# Patient Record
Sex: Female | Born: 1990 | Race: Black or African American | Hispanic: No | Marital: Single | Smoking: Former smoker
Health system: Southern US, Community
[De-identification: ages and names within clinical notes are randomized; demographics above are authoritative.]

## PROBLEM LIST (undated history)

## (undated) DIAGNOSIS — J45909 Unspecified asthma, uncomplicated: Secondary | ICD-10-CM

## (undated) DIAGNOSIS — I10 Essential (primary) hypertension: Secondary | ICD-10-CM

## (undated) DIAGNOSIS — E0590 Thyrotoxicosis, unspecified without thyrotoxic crisis or storm: Secondary | ICD-10-CM

## (undated) HISTORY — DX: Thyrotoxicosis, unspecified without thyrotoxic crisis or storm: E05.90

## (undated) HISTORY — PX: NO PAST SURGERIES: SHX2092

---
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
## 2010-01-29 ENCOUNTER — Emergency Department (HOSPITAL_COMMUNITY): Admission: EM | Admit: 2010-01-29 | Discharge: 2010-01-29 | Payer: Self-pay | Admitting: Family Medicine

## 2012-05-15 ENCOUNTER — Emergency Department (HOSPITAL_COMMUNITY): Payer: BC Managed Care – PPO

## 2012-05-15 ENCOUNTER — Emergency Department (HOSPITAL_COMMUNITY)
Admission: EM | Admit: 2012-05-15 | Discharge: 2012-05-15 | Disposition: A | Discharge status: Home / Self Care | Payer: BC Managed Care – PPO | Attending: Emergency Medicine | Admitting: Emergency Medicine

## 2012-05-15 ENCOUNTER — Encounter (HOSPITAL_COMMUNITY): Payer: Self-pay | Admitting: Emergency Medicine

## 2012-05-15 DIAGNOSIS — Z87891 Personal history of nicotine dependence: Secondary | ICD-10-CM | POA: Insufficient documentation

## 2012-05-15 DIAGNOSIS — Y929 Unspecified place or not applicable: Secondary | ICD-10-CM | POA: Insufficient documentation

## 2012-05-15 DIAGNOSIS — S93609A Unspecified sprain of unspecified foot, initial encounter: Secondary | ICD-10-CM | POA: Insufficient documentation

## 2012-05-15 DIAGNOSIS — Z79899 Other long term (current) drug therapy: Secondary | ICD-10-CM | POA: Insufficient documentation

## 2012-05-15 DIAGNOSIS — X500XXA Overexertion from strenuous movement or load, initial encounter: Secondary | ICD-10-CM | POA: Insufficient documentation

## 2012-05-15 DIAGNOSIS — J45909 Unspecified asthma, uncomplicated: Secondary | ICD-10-CM | POA: Insufficient documentation

## 2012-05-15 DIAGNOSIS — Y9301 Activity, walking, marching and hiking: Secondary | ICD-10-CM | POA: Insufficient documentation

## 2012-05-15 HISTORY — DX: Unspecified asthma, uncomplicated: J45.909

## 2012-05-15 NOTE — ED Provider Notes (Signed)
History   This chart was scribed for Kathleen Lyons, MD, by Frederik Pear, ER scribe. The patient was seen in room TR05C/TR05C and the patient's care was started at 1334.    CSN: [id number]  Arrival date & time 05/15/12  1317   First MD Initiated Contact with Patient 05/15/12 1334      Chief Complaint  Patient presents with  . Foot Injury    (Consider location/radiation/quality/duration/timing/severity/associated sxs/prior treatment) HPI Comments: [name] is a 21 y.o. female who presents to the Emergency Department complaining of right foot pain that began after she twisted her ankle while walking twice today and once last night. She reports that she previously broke her ankle in high school. She denies any associated pain or symptoms. She denies trying any treatments prior to arrival in ED.     Past Medical History  Diagnosis Date  . Asthma     History reviewed. No pertinent past surgical history.  No family history on file.  History  Substance Use Topics  . Smoking status: Former Games developer  . Smokeless tobacco: Not on file  . Alcohol Use: Yes    OB History    Grav Para Term Preterm Abortions TAB SAB Ect Mult Living                  Review of Systems  Allergies  Review of patient's allergies indicates no known allergies.  Home Medications   Current Outpatient Rx  Name  Route  Sig  Dispense  Refill  . ALBUTEROL SULFATE HFA 108 (90 BASE) MCG/ACT IN AERS   Inhalation   Inhale 2 puffs into the lungs every 6 (six) hours as needed. For wheezing and shortness of breath.         Marland Kitchen DIPHENHYDRAMINE HCL 25 MG PO TABS   Oral   Take 25 mg by mouth every 6 (six) hours as needed. For swelling.           BP 131/76  Pulse 120  Temp 98.1 F (36.7 C) (Oral)  Resp 16  SpO2 99%  LMP 04/26/2012  Physical Exam  Nursing note and vitals reviewed. Constitutional: She is oriented to person, place, and time. She appears well-developed and well-nourished. No  distress.  HENT:  Head: Normocephalic and atraumatic.  Eyes: EOM are normal. Pupils are equal, round, and reactive to light.  Neck: Normal range of motion. Neck supple. No tracheal deviation present.  Cardiovascular: Normal rate.   Pulmonary/Chest: Effort normal. No respiratory distress.  Abdominal: Soft. She exhibits no distension.  Musculoskeletal: Normal range of motion. She exhibits no edema.       There is some mild swelling and tenderness to palpation over the dorsal proximal right foot. There is no medial or malleolar tenderness.  Neurological: She is alert and oriented to person, place, and time.  Skin: Skin is warm and dry.  Psychiatric: She has a normal mood and affect. Her behavior is normal.    ED Course  Procedures (including critical care time)  DIAGNOSTIC STUDIES: Oxygen Saturation is 99% on room air, normal by my interpretation.    COORDINATION OF CARE:  13:44- Discussed planned course of treatment with the patient, including a right foot X-ray, who is agreeable at this time.   Labs Reviewed - No data to display Dg Foot Complete Right  05/15/2012  *RADIOLOGY REPORT*  Clinical Data: Twisting injury with pain and swelling of the right foot.  RIGHT FOOT COMPLETE - 3+ VIEW  Comparison:  None.  Findings:  There is no evidence of fracture or dislocation.  There is no evidence of arthropathy or other focal bone abnormality. Soft tissues are unremarkable.  IMPRESSION: Negative.   Original Report Authenticated By: Irish Lack, M.D.      No diagnosis found.    MDM  The xrays are negative, will treat as a sprain.    I personally performed the services described in this documentation, which was scribed in my presence. The recorded information has been reviewed and is accurate.          Kathleen Lyons, MD 05/15/12 1425

## 2012-05-15 NOTE — ED Notes (Signed)
Pt. Twisted her rt. Foot x 2 once today and last night

## 2012-12-13 ENCOUNTER — Other Ambulatory Visit: Payer: Self-pay | Admitting: Obstetrics and Gynecology

## 2012-12-14 ENCOUNTER — Encounter (HOSPITAL_COMMUNITY): Payer: Self-pay | Admitting: *Deleted

## 2012-12-15 MED ORDER — DOXYCYCLINE HYCLATE 100 MG IV SOLR
100.0000 mg | INTRAVENOUS | Status: AC
  Administered 2012-12-16: 100 mg via INTRAVENOUS
  Filled 2012-12-15: qty 100

## 2012-12-16 ENCOUNTER — Encounter (HOSPITAL_COMMUNITY): Payer: Self-pay | Admitting: Anesthesiology

## 2012-12-16 ENCOUNTER — Encounter (HOSPITAL_COMMUNITY)
Admission: RE | Disposition: A | Discharge status: Home / Self Care | Payer: Self-pay | Source: Ambulatory Visit | Attending: Obstetrics and Gynecology

## 2012-12-16 ENCOUNTER — Ambulatory Visit (HOSPITAL_COMMUNITY)
Admission: RE | Admit: 2012-12-16 | Discharge: 2012-12-16 | Disposition: A | Discharge status: Home / Self Care | Payer: BC Managed Care – PPO | Source: Ambulatory Visit | Attending: Obstetrics and Gynecology | Admitting: Obstetrics and Gynecology

## 2012-12-16 ENCOUNTER — Ambulatory Visit (HOSPITAL_COMMUNITY): Payer: BC Managed Care – PPO | Admitting: Anesthesiology

## 2012-12-16 DIAGNOSIS — Z640 Problems related to unwanted pregnancy: Secondary | ICD-10-CM | POA: Insufficient documentation

## 2012-12-16 DIAGNOSIS — Z79899 Other long term (current) drug therapy: Secondary | ICD-10-CM | POA: Insufficient documentation

## 2012-12-16 DIAGNOSIS — J45909 Unspecified asthma, uncomplicated: Secondary | ICD-10-CM | POA: Insufficient documentation

## 2012-12-16 DIAGNOSIS — Z332 Encounter for elective termination of pregnancy: Secondary | ICD-10-CM

## 2012-12-16 HISTORY — PX: DILATION AND EVACUATION: SHX1459

## 2012-12-16 LAB — CBC
HCT: 40.8 % (ref 36.0–46.0)
Hemoglobin: 14.7 g/dL (ref 12.0–15.0)
MCH: 29.8 pg (ref 26.0–34.0)
MCHC: 36 g/dL (ref 30.0–36.0)

## 2012-12-16 LAB — ABO/RH: ABO/RH(D): O POS

## 2012-12-16 SURGERY — DILATION AND EVACUATION, UTERUS
Anesthesia: Monitor Anesthesia Care | Site: Vagina | Wound class: Clean Contaminated

## 2012-12-16 MED ORDER — PROPOFOL 10 MG/ML IV BOLUS
INTRAVENOUS | Status: DC | PRN
  Administered 2012-12-16: 200 mg via INTRAVENOUS

## 2012-12-16 MED ORDER — IBUPROFEN 800 MG PO TABS: 800.0000 mg | ORAL_TABLET | Freq: Three times a day (TID) | ORAL | Status: DC | PRN

## 2012-12-16 MED ORDER — MIDAZOLAM HCL 5 MG/5ML IJ SOLN
INTRAMUSCULAR | Status: DC | PRN
  Administered 2012-12-16: 2 mg via INTRAVENOUS

## 2012-12-16 MED ORDER — LIDOCAINE HCL (CARDIAC) 20 MG/ML IV SOLN
INTRAVENOUS | Status: DC | PRN
  Administered 2012-12-16: 100 mg via INTRAVENOUS

## 2012-12-16 MED ORDER — PROPOFOL 10 MG/ML IV EMUL
INTRAVENOUS | Status: AC
  Filled 2012-12-16: qty 20

## 2012-12-16 MED ORDER — MEPERIDINE HCL 25 MG/ML IJ SOLN: 6.2500 mg | INTRAMUSCULAR | Status: DC | PRN

## 2012-12-16 MED ORDER — FENTANYL CITRATE 0.05 MG/ML IJ SOLN
INTRAMUSCULAR | Status: AC
  Filled 2012-12-16: qty 2

## 2012-12-16 MED ORDER — ONDANSETRON HCL 4 MG/2ML IJ SOLN
INTRAMUSCULAR | Status: AC
  Filled 2012-12-16: qty 2

## 2012-12-16 MED ORDER — ONDANSETRON HCL 4 MG/2ML IJ SOLN
INTRAMUSCULAR | Status: DC | PRN
  Administered 2012-12-16: 4 mg via INTRAVENOUS

## 2012-12-16 MED ORDER — CLINDAMYCIN HCL 300 MG PO CAPS: 300.0000 mg | ORAL_CAPSULE | Freq: Two times a day (BID) | ORAL | Status: DC

## 2012-12-16 MED ORDER — CHLOROPROCAINE HCL 1 % IJ SOLN
INTRAMUSCULAR | Status: DC | PRN
  Administered 2012-12-16: 20 mL

## 2012-12-16 MED ORDER — LIDOCAINE HCL (CARDIAC) 20 MG/ML IV SOLN
INTRAVENOUS | Status: AC
  Filled 2012-12-16: qty 5

## 2012-12-16 MED ORDER — CHLOROPROCAINE HCL 1 % IJ SOLN
INTRAMUSCULAR | Status: AC
  Filled 2012-12-16: qty 30

## 2012-12-16 MED ORDER — MIDAZOLAM HCL 2 MG/2ML IJ SOLN
INTRAMUSCULAR | Status: AC
  Filled 2012-12-16: qty 2

## 2012-12-16 MED ORDER — FENTANYL CITRATE 0.05 MG/ML IJ SOLN
INTRAMUSCULAR | Status: DC | PRN
  Administered 2012-12-16: 100 ug via INTRAVENOUS

## 2012-12-16 MED ORDER — METOCLOPRAMIDE HCL 5 MG/ML IJ SOLN: 10.0000 mg | Freq: Once | INTRAMUSCULAR | Status: AC | PRN

## 2012-12-16 MED ORDER — FENTANYL CITRATE 0.05 MG/ML IJ SOLN: 25.0000 ug | INTRAMUSCULAR | Status: DC | PRN

## 2012-12-16 MED ORDER — LACTATED RINGERS IV SOLN
INTRAVENOUS | Status: DC
  Administered 2012-12-16 (×2): via INTRAVENOUS

## 2012-12-16 SURGICAL SUPPLY — 20 items
CATH ROBINSON RED A/P 16FR (CATHETERS) ×2 IMPLANT
CLOTH BEACON ORANGE TIMEOUT ST (SAFETY) ×2 IMPLANT
DECANTER SPIKE VIAL GLASS SM (MISCELLANEOUS) ×2 IMPLANT
GLOVE BIO SURGEON STRL SZ 6.5 (GLOVE) ×2 IMPLANT
GLOVE BIOGEL PI IND STRL 7.0 (GLOVE) ×1 IMPLANT
GLOVE BIOGEL PI INDICATOR 7.0 (GLOVE) ×1
GOWN STRL REIN XL XLG (GOWN DISPOSABLE) ×4 IMPLANT
KIT BERKELEY 1ST TRIMESTER 3/8 (MISCELLANEOUS) ×2 IMPLANT
NEEDLE SPNL 22GX3.5 QUINCKE BK (NEEDLE) ×2 IMPLANT
NS IRRIG 1000ML POUR BTL (IV SOLUTION) ×2 IMPLANT
PACK VAGINAL MINOR WOMEN LF (CUSTOM PROCEDURE TRAY) ×2 IMPLANT
PAD OB MATERNITY 4.3X12.25 (PERSONAL CARE ITEMS) ×2 IMPLANT
PAD PREP 24X48 CUFFED NSTRL (MISCELLANEOUS) ×2 IMPLANT
SET BERKELEY SUCTION TUBING (SUCTIONS) ×2 IMPLANT
SYR CONTROL 10ML LL (SYRINGE) ×2 IMPLANT
TOWEL OR 17X24 6PK STRL BLUE (TOWEL DISPOSABLE) ×4 IMPLANT
VACURETTE 10 RIGID CVD (CANNULA) IMPLANT
VACURETTE 7MM CVD STRL WRAP (CANNULA) IMPLANT
VACURETTE 8 RIGID CVD (CANNULA) IMPLANT
VACURETTE 9 RIGID CVD (CANNULA) IMPLANT

## 2012-12-16 NOTE — Transfer of Care (Signed)
Immediate Anesthesia Transfer of Care Note  Patient: Kathleen [name]  Procedure(s) Performed: Procedure(s): DILATATION AND EVACUATION (N/A)  Patient Location: PACU  Anesthesia Type:General  Level of Consciousness: awake, alert  and oriented  Airway & Oxygen Therapy: Patient Spontanous Breathing  Post-op Assessment: Report given to PACU RN and Post -op Vital signs reviewed and stable  Post vital signs: Reviewed and stable  Complications: No apparent anesthesia complications

## 2012-12-16 NOTE — Brief Op Note (Signed)
12/16/2012  5:58 PM  PATIENT:  Kathleen [name]  22 y.o. female  PRE-OPERATIVE DIAGNOSIS:  Undesired Pregnancy , IUP @ 9 weeks  POST-OPERATIVE DIAGNOSIS:  Undesired Pregnancy ,IUP @ 9 weeks  PROCEDURE:  Suction dilation and evacuation  SURGEON:  Surgeon(s) and Role:    * Melba Araki Cathie Beams, MD - Primary  PHYSICIAN ASSISTANT:   ASSISTANTS: none   ANESTHESIA:   general and paracervical block  EBL:  Total I/O In: 800 [I.V.:800] Out: 69 [Urine:39; Blood:30]  BLOOD ADMINISTERED:none  DRAINS: none   LOCAL MEDICATIONS USED:  OTHER nesicaine  SPECIMEN:  Source of Specimen:  POC  DISPOSITION OF SPECIMEN:  PATHOLOGY  COUNTS:  YES  TOURNIQUET:  * No tourniquets in log *  DICTATION: .Other Dictation: Dictation Number [id number]  PLAN OF CARE: Discharge to home after PACU  PATIENT DISPOSITION:  PACU - hemodynamically stable.   Delay start of Pharmacological VTE agent (>24hrs) due to surgical blood loss or risk of bleeding: no

## 2012-12-16 NOTE — Anesthesia Preprocedure Evaluation (Signed)
Anesthesia Evaluation  Patient identified by MRN, date of birth, ID band Patient awake    Reviewed: Allergy & Precautions, H&P , NPO status , Patient's Chart, lab work & pertinent test results  Airway Mallampati: II TM Distance: >3 FB Neck ROM: Full    Dental  (+) Teeth Intact   Pulmonary neg pulmonary ROS, asthma ,  breath sounds clear to auscultation  Pulmonary exam normal       Cardiovascular negative cardio ROS  Rhythm:Regular Rate:Normal     Neuro/Psych negative neurological ROS  negative psych ROS   GI/Hepatic negative GI ROS, Neg liver ROS,   Endo/Other  negative endocrine ROS  Renal/GU negative Renal ROS     Musculoskeletal negative musculoskeletal ROS (+)   Abdominal   Peds  Hematology negative hematology ROS (+)   Anesthesia Other Findings   Reproductive/Obstetrics Undesired pregnancy 8 Weeks                           Anesthesia Physical Anesthesia Plan  ASA: II  Anesthesia Plan: General and MAC   Post-op Pain Management:    Induction: Intravenous  Airway Management Planned: LMA and Mask  Additional Equipment:   Intra-op Plan:   Post-operative Plan: Extubation in OR  Informed Consent: I have reviewed the patients History and Physical, chart, labs and discussed the procedure including the risks, benefits and alternatives for the proposed anesthesia with the patient or authorized representative who has indicated his/her understanding and acceptance.   Dental advisory given  Plan Discussed with: Anesthesiologist, CRNA and Surgeon  Anesthesia Plan Comments:         Anesthesia Quick Evaluation

## 2012-12-16 NOTE — Anesthesia Postprocedure Evaluation (Signed)
  Anesthesia Post-op Note  Patient: Kathleen [name]  Procedure(s) Performed: Procedure(s): DILATATION AND EVACUATION (N/A)  Patient Location: PACU  Anesthesia Type:General  Level of Consciousness: awake, alert  and oriented  Airway and Oxygen Therapy: Patient Spontanous Breathing  Post-op Pain: none  Post-op Assessment: Post-op Vital signs reviewed, Patient's Cardiovascular Status Stable, Respiratory Function Stable, Patent Airway, No signs of Nausea or vomiting and Pain level controlled  Post-op Vital Signs: Reviewed and stable  Complications: No apparent anesthesia complications

## 2012-12-17 ENCOUNTER — Encounter (HOSPITAL_COMMUNITY): Payer: Self-pay | Admitting: Obstetrics and Gynecology

## 2012-12-17 NOTE — Op Note (Signed)
[name], [name] NO.:  [id number]  MEDICAL RECORD NO.:  [id number]  LOCATION:  [facility]                          FACILITY:  [facility]  PHYSICIAN:  Maxie Better, M.D.DATE OF BIRTH:  [birth year]  DATE OF PROCEDURE:  12/16/2012 DATE OF DISCHARGE:                              OPERATIVE REPORT   PREOPERATIVE DIAGNOSIS:  Undesired pregnancy, intrauterine gestation at 9 weeks.  POSTOPERATIVE DIAGNOSIS:  Undesired pregnancy, intrauterine gestation at 9 weeks.  PROCEDURE:  Suction dilation and evacuation.  ANESTHESIA:  General, paracervical block.  SURGEON:  Maxie Better, M.D.  ASSISTANT:  None.  PROCEDURE IN DETAIL:  Under adequate general anesthesia, the patient was placed in the dorsal lithotomy position.  She was sterilely prepped and draped in the usual fashion.  Bladder was catheterized for minimal amount of urine.  Examination under anesthesia revealed an 8-9 week size uterus.  No adnexal masses could be appreciated.  Bivalve speculum was placed in the vagina.  Single-tooth tenaculum was placed on the anterior lip of the cervix. Twenty cc 1% Nesacaine was injected paracervically at the 3 and 9 o'clock position.  The cervix was carefully dilated up to a #31 Pratt dilator.  A #9 mm curved suction cannula was introduced into the uterine cavity.  Large amount of products of conception was obtained. The cavity was suctioned, then subsequently gently curetted.  At that point, when all tissue was felt to be removed, all instruments were then removed from the vagina.  SPECIMEN:  Labeled products of conception was sent to Pathology.  ESTIMATED BLOOD LOSS:  Minimal.  COMPLICATION:  None.  Maternal blood type is O positive.  The patient tolerated the procedure well, was transferred to recovery in stable condition.    Maxie Better, M.D.    [place]/[facility]  D:  12/16/2012  T:  12/17/2012  Job:  [id number]

## 2013-05-25 ENCOUNTER — Encounter (HOSPITAL_COMMUNITY): Payer: Self-pay | Admitting: Emergency Medicine

## 2013-05-25 ENCOUNTER — Emergency Department (HOSPITAL_COMMUNITY)
Admission: EM | Admit: 2013-05-25 | Discharge: 2013-05-25 | Disposition: A | Discharge status: Home / Self Care | Payer: BC Managed Care – PPO | Attending: Emergency Medicine | Admitting: Emergency Medicine

## 2013-05-25 DIAGNOSIS — Z87891 Personal history of nicotine dependence: Secondary | ICD-10-CM | POA: Insufficient documentation

## 2013-05-25 DIAGNOSIS — J45909 Unspecified asthma, uncomplicated: Secondary | ICD-10-CM | POA: Insufficient documentation

## 2013-05-25 DIAGNOSIS — F1210 Cannabis abuse, uncomplicated: Secondary | ICD-10-CM | POA: Insufficient documentation

## 2013-05-25 DIAGNOSIS — F22 Delusional disorders: Secondary | ICD-10-CM | POA: Insufficient documentation

## 2013-05-25 DIAGNOSIS — Z79899 Other long term (current) drug therapy: Secondary | ICD-10-CM | POA: Insufficient documentation

## 2013-05-25 DIAGNOSIS — F1290 Cannabis use, unspecified, uncomplicated: Secondary | ICD-10-CM

## 2013-05-25 DIAGNOSIS — R197 Diarrhea, unspecified: Secondary | ICD-10-CM | POA: Insufficient documentation

## 2013-05-25 DIAGNOSIS — Z3202 Encounter for pregnancy test, result negative: Secondary | ICD-10-CM | POA: Insufficient documentation

## 2013-05-25 LAB — POCT PREGNANCY, URINE: Preg Test, Ur: NEGATIVE

## 2013-05-25 LAB — RAPID URINE DRUG SCREEN, HOSP PERFORMED: Benzodiazepines: NOT DETECTED

## 2013-05-25 NOTE — ED Provider Notes (Signed)
CSN: [id number]     Arrival date & time 05/25/13  4098 History   First MD Initiated Contact with Patient 05/25/13 [phone]     Chief Complaint  Patient presents with  . reaction to recreational drug    (Consider location/radiation/quality/duration/timing/severity/associated sxs/prior Treatment) HPI This is a 22 year old female who presents the emergency department with chief complaint of feeling abnormal after smoking marijuana.  Patient states she has not smoked marijuana since July.  2 days ago she was at a house party and she are took and smoking marijuana with some friends.  She states that one friend was riding about the high potency of the marijuana.  Patient states while she was smoking she began feeling her heart race.  She states that she felt extremely paranoid.  The patient went home and states he was unable to sleep.  She states she felt "extremely strange."  Her friend who is present with her in the room states that her eyes were very dilated.  Patient states that she has not felt well since the incident.  She's had a few episodes of watery stools however she denies any abdominal pain, nausea, vomiting, change in the color of her stools.  The patient also states that her appetite has been decreased.  The patient is also concerned that she may be pregnant as she has been having unprotected sexual intercourse.  She denies any urinary or vaginal symptoms.  The patient      last menstrual period was 05/20/2013.  Past Medical History  Diagnosis Date  . Asthma    Past Surgical History  Procedure Laterality Date  . No past surgeries    . Wisdom tooth extraction  2009  . Dilation and evacuation N/A 12/16/2012    Procedure: DILATATION AND EVACUATION;  Surgeon: Serita Kyle, MD;  Location: [facility];  Service: Gynecology;  Laterality: N/A;   No family history on file. History  Substance Use Topics  . Smoking status: Former Games developer  . Smokeless tobacco: Not on file  . Alcohol Use: No    OB History   Grav Para Term Preterm Abortions TAB SAB Ect Mult Living   1              Review of Systems Ten systems reviewed and are negative for acute change, except as noted in the HPI.   Allergies  Review of patient's allergies indicates no known allergies.  Home Medications   Current Outpatient Rx  Name  Route  Sig  Dispense  Refill  . albuterol (PROVENTIL HFA;VENTOLIN HFA) 108 (90 BASE) MCG/ACT inhaler   Inhalation   Inhale 2 puffs into the lungs every 6 (six) hours as needed. For wheezing and shortness of breath.          BP 132/76  Pulse 85  Temp(Src) 98 F (36.7 C) (Oral)  Resp 18  Ht 5\' 7"  (1.702 m)  Wt 180 lb (81.647 kg)  BMI 28.19 kg/m2  SpO2 98%  LMP 05/12/2013  Breastfeeding? Unknown Physical Exam Physical Exam  Nursing note and vitals reviewed. Constitutional: She is oriented to person, place, and time. She appears well-developed and well-nourished. No distress.  HENT:  Head: Normocephalic and atraumatic.  Eyes: Conjunctivae normal and EOM are normal. Pupils are equal, round, and reactive to light. No scleral icterus.  Neck: Normal range of motion.  Cardiovascular: Normal rate, regular rhythm and normal heart sounds.  Exam reveals no gallop and no friction rub.   No murmur heard. Pulmonary/Chest: Effort normal  and breath sounds normal. No respiratory distress.  Abdominal: Soft. Bowel sounds are normal. She exhibits no distension and no mass. There is no tenderness. There is no guarding.  Neurological: She is alert and oriented to person, place, and time.  Skin: Skin is warm and dry. She is not diaphoretic.    ED Course  Procedures (including critical care time) Labs Review Labs Reviewed  URINE RAPID DRUG SCREEN (HOSP PERFORMED)   Imaging Review No results found.  EKG Interpretation   None       MDM   1. Marijuana use    Patient here for complained of feeling abnormal after smoking or one of.  She is concerned that her marijuana  may have been laced.  Explained that sometimes the side effect of marijuana smoking is racing heart and paranoia.  However we will check a urine drug screen and a pregnancy.   Patient preg, and uds negative. Suggest follow up w/ pcp for further testing if sxs continue. The patient appears reasonably screened and/or stabilized for discharge and I doubt any other medical condition or other [facility] requiring further screening, evaluation, or treatment in the ED at this time prior to discharge.      Arthor Captain, PA-C 05/25/13 2033

## 2013-05-25 NOTE — ED Provider Notes (Signed)
Medical screening examination/treatment/procedure(s) were performed by non-physician practitioner and as supervising physician I was immediately available for consultation/collaboration.  EKG Interpretation   None         Enid Skeens, MD 05/25/13 2356

## 2013-05-25 NOTE — ED Notes (Signed)
Patient states she was at a house party Sunday night into Monday morning.   She claims "we were smoking marijuana, and I haven't been right since".  Patient complains of paranoia, diarrhea, nausea.   Patient states she also wants to find out if she is pregnant.

## 2013-05-25 NOTE — ED Notes (Signed)
Report received, assumed care.  

## 2014-04-01 ENCOUNTER — Encounter (HOSPITAL_COMMUNITY): Payer: Self-pay | Admitting: Emergency Medicine

## 2014-04-01 ENCOUNTER — Emergency Department (HOSPITAL_COMMUNITY)
Admission: EM | Admit: 2014-04-01 | Discharge: 2014-04-01 | Disposition: A | Discharge status: Home / Self Care | Payer: BC Managed Care – PPO | Attending: Emergency Medicine | Admitting: Emergency Medicine

## 2014-04-01 DIAGNOSIS — Z87891 Personal history of nicotine dependence: Secondary | ICD-10-CM | POA: Insufficient documentation

## 2014-04-01 DIAGNOSIS — J45901 Unspecified asthma with (acute) exacerbation: Secondary | ICD-10-CM

## 2014-04-01 MED ORDER — PREDNISONE 20 MG PO TABS
60.0000 mg | ORAL_TABLET | Freq: Once | ORAL | Status: AC
  Administered 2014-04-01: 60 mg via ORAL
  Filled 2014-04-01: qty 3

## 2014-04-01 MED ORDER — ALBUTEROL SULFATE HFA 108 (90 BASE) MCG/ACT IN AERS
2.0000 | INHALATION_SPRAY | RESPIRATORY_TRACT | Status: DC | PRN
  Administered 2014-04-01: 2 via RESPIRATORY_TRACT
  Filled 2014-04-01: qty 6.7

## 2014-04-01 MED ORDER — ALBUTEROL SULFATE (2.5 MG/3ML) 0.083% IN NEBU
5.0000 mg | INHALATION_SOLUTION | Freq: Once | RESPIRATORY_TRACT | Status: AC
  Administered 2014-04-01: 5 mg via RESPIRATORY_TRACT
  Filled 2014-04-01: qty 6

## 2014-04-01 MED ORDER — IPRATROPIUM BROMIDE 0.02 % IN SOLN
0.5000 mg | Freq: Once | RESPIRATORY_TRACT | Status: AC
  Administered 2014-04-01: 0.5 mg via RESPIRATORY_TRACT
  Filled 2014-04-01: qty 2.5

## 2014-04-01 MED ORDER — PREDNISONE 20 MG PO TABS: 60.0000 mg | ORAL_TABLET | Freq: Every day | ORAL | Status: DC

## 2014-04-01 NOTE — ED Notes (Signed)
Pt. Stated, I've been having difficult breathing since yesterday afternoon.  I have an inhaler and medicine but its not working.

## 2014-04-01 NOTE — Discharge Instructions (Signed)

## 2014-04-01 NOTE — ED Provider Notes (Signed)
CSN: [id number]     Arrival date & time 04/01/14  0715 History   First MD Initiated Contact with Patient 04/01/14 [phone]     Chief Complaint  Patient presents with  . Shortness of Breath     (Consider location/radiation/quality/duration/timing/severity/associated sxs/prior Treatment) HPI Comments: Patient with a history of Asthma presents today with a chief complaint of SOB.  She reports that she began feeling short of breath yesterday and progressively worsening.  She reports that her SOB feels similar to what she has had in the past with an Asthma Exacerbation.  She has tried using her boyfriends expired Albuterol inhaler, but does not feel that it is helping.  She reports associated dry cough and congestion.  Denies fever, chills, chest pain, LE edema, ear pain, sinus pain, or sore throat.    The history is provided by the patient.    Past Medical History  Diagnosis Date  . Asthma    Past Surgical History  Procedure Laterality Date  . No past surgeries    . Wisdom tooth extraction  2009  . Dilation and evacuation N/A 12/16/2012    Procedure: DILATATION AND EVACUATION;  Surgeon: Serita KyleSheronette A Cousins, MD;  Location: [facility];  Service: Gynecology;  Laterality: N/A;   No family history on file. History  Substance Use Topics  . Smoking status: Former Games developermoker  . Smokeless tobacco: Not on file  . Alcohol Use: No   OB History   Grav Para Term Preterm Abortions TAB SAB Ect Mult Living   2              Review of Systems  All other systems reviewed and are negative.     Allergies  Review of patient's allergies indicates no known allergies.  Home Medications   Prior to Admission medications   Medication Sig Start Date End Date Taking? Authorizing Provider  albuterol (PROVENTIL HFA;VENTOLIN HFA) 108 (90 BASE) MCG/ACT inhaler Inhale 2 puffs into the lungs every 6 (six) hours as needed. For wheezing and shortness of breath.    Historical Provider, MD   BP 126/71  Pulse 100   Temp(Src) 98.4 F (36.9 C) (Oral)  Resp 18  SpO2 100%  LMP 02/26/2014 Physical Exam  Nursing note and vitals reviewed. Constitutional: She appears well-developed and well-nourished.  HENT:  Head: Normocephalic and atraumatic.  Mouth/Throat: Oropharynx is clear and moist.  Neck: Normal range of motion. Neck supple.  Cardiovascular: Normal rate, regular rhythm and normal heart sounds.   Pulmonary/Chest: Effort normal. No respiratory distress. She has wheezes. She has no rales. She exhibits no tenderness.  Diffuse late inspiratory and expiratory wheezing Patient speaking in complete sentences  Musculoskeletal: Normal range of motion.  No LE edema bilaterally  Neurological: She is alert.  Skin: Skin is warm and dry.  Psychiatric: She has a normal mood and affect.    ED Course  Procedures (including critical care time) Labs Review Labs Reviewed - No data to display  Imaging Review No results found.   EKG Interpretation None     10:30 AM Reassessed patient.  She reports that her breathing is at baseline.  Lungs CTAB.  Will order an Albuterol inhaler for the patient to take home. MDM   Final diagnoses:  None   Patient with a history of Asthma presents today with a chief complaint of cough and SOB consistent with previous Asthma exacerbations.  Pulse ox 100 on RA.  Symptoms improved after nebulized breathing treatment and Prednisone. Patient is afebrile.  No chest pain.  Patient reports that she is breathing at baseline at the time of discharge.  Feel that the patient is stable for discharge.  Patient discharged home with 5 day course of Prednisone and given Albuterol inhaler.  Return precautions given.    Santiago GladHeather Samari Bittinger, PA-C 04/01/14 1054

## 2014-04-01 NOTE — ED Provider Notes (Signed)
Medical screening examination/treatment/procedure(s) were performed by non-physician practitioner and as supervising physician I was immediately available for consultation/collaboration.   EKG Interpretation None        Vanetta MuldersScott Maximiliano Cromartie, MD 04/01/14 1103

## 2014-04-24 ENCOUNTER — Encounter (HOSPITAL_COMMUNITY): Payer: Self-pay | Admitting: Emergency Medicine

## 2014-12-03 ENCOUNTER — Emergency Department (HOSPITAL_COMMUNITY)
Admission: EM | Admit: 2014-12-03 | Discharge: 2014-12-03 | Disposition: A | Discharge status: Home / Self Care | Payer: 59 | Attending: Emergency Medicine | Admitting: Emergency Medicine

## 2014-12-03 ENCOUNTER — Encounter (HOSPITAL_COMMUNITY): Payer: Self-pay | Admitting: Emergency Medicine

## 2014-12-03 DIAGNOSIS — Z79899 Other long term (current) drug therapy: Secondary | ICD-10-CM | POA: Insufficient documentation

## 2014-12-03 DIAGNOSIS — Z87891 Personal history of nicotine dependence: Secondary | ICD-10-CM | POA: Diagnosis not present

## 2014-12-03 DIAGNOSIS — Z7952 Long term (current) use of systemic steroids: Secondary | ICD-10-CM | POA: Diagnosis not present

## 2014-12-03 DIAGNOSIS — J029 Acute pharyngitis, unspecified: Secondary | ICD-10-CM | POA: Diagnosis present

## 2014-12-03 DIAGNOSIS — J45909 Unspecified asthma, uncomplicated: Secondary | ICD-10-CM | POA: Insufficient documentation

## 2014-12-03 DIAGNOSIS — R070 Pain in throat: Secondary | ICD-10-CM | POA: Diagnosis not present

## 2014-12-03 LAB — RAPID STREP SCREEN (MED CTR MEBANE ONLY): STREPTOCOCCUS, GROUP A SCREEN (DIRECT): NEGATIVE

## 2014-12-03 MED ORDER — AZITHROMYCIN 250 MG PO TABS
1000.0000 mg | ORAL_TABLET | Freq: Once | ORAL | Status: AC
  Administered 2014-12-03: 1000 mg via ORAL
  Filled 2014-12-03: qty 4

## 2014-12-03 MED ORDER — ONDANSETRON 4 MG PO TBDP
4.0000 mg | ORAL_TABLET | Freq: Once | ORAL | Status: AC
  Administered 2014-12-03: 4 mg via ORAL
  Filled 2014-12-03: qty 1

## 2014-12-03 MED ORDER — LIDOCAINE HCL (PF) 1 % IJ SOLN
0.9000 mL | Freq: Once | INTRAMUSCULAR | Status: AC
  Administered 2014-12-03: 0.9 mL
  Filled 2014-12-03: qty 5

## 2014-12-03 MED ORDER — CEFTRIAXONE SODIUM 250 MG IJ SOLR
250.0000 mg | INTRAMUSCULAR | Status: DC
  Administered 2014-12-03: 250 mg via INTRAMUSCULAR
  Filled 2014-12-03: qty 250

## 2014-12-03 NOTE — ED Provider Notes (Signed)
CSN: [id number]     Arrival date & time 12/03/14  1331 History  This chart was scribed for Marlon Pel, PA-C, working with Purvis Sheffield, MD by Chestine Spore, ED Scribe. The patient was seen in room TR04C/TR04C at 3:46 PM.    Chief Complaint  Patient presents with  . Sore Throat     The history is provided by the patient. No language interpreter was used.    HPI Comments: Kathleen [name] is a 24 y.o. female who presents to the Emergency Department complaining of sore throat onset yesterday. She reports that her throat is hurting only when she swallows. Pt voices concern for if her symptoms are representative of a STD. She reports that she gave oral sex to someone she didn't know 6 days ago and she would like to be checked today.. She states that she is having associated symptoms of rhinorrhea. She states that she has tried chloraseptic spray and cold formula with mild relief for her symptoms. She denies ear pain, dental pain, fever, n/v/d, neck pain, congestion, and any other symptoms.   Past Medical History  Diagnosis Date  . Asthma    Past Surgical History  Procedure Laterality Date  . No past surgeries    . Wisdom tooth extraction  2009  . Dilation and evacuation N/A 12/16/2012    Procedure: DILATATION AND EVACUATION;  Surgeon: Serita Kyle, MD;  Location: [facility];  Service: Gynecology;  Laterality: N/A;   No family history on file. History  Substance Use Topics  . Smoking status: Former Games developer  . Smokeless tobacco: Not on file  . Alcohol Use: No   OB History    Gravida Para Term Preterm AB TAB SAB Ectopic Multiple Living   2              Review of Systems  Constitutional: Negative for fever.  HENT: Positive for sore throat. Negative for congestion, dental problem, ear pain and rhinorrhea.   Gastrointestinal: Negative for nausea, vomiting and diarrhea.      Allergies  Review of patient's allergies indicates no known allergies.  Home Medications   Prior  to Admission medications   Medication Sig Start Date End Date Taking? Authorizing Provider  albuterol (PROVENTIL HFA;VENTOLIN HFA) 108 (90 BASE) MCG/ACT inhaler Inhale 2 puffs into the lungs every 6 (six) hours as needed. For wheezing and shortness of breath.    Historical Provider, MD  montelukast (SINGULAIR) 10 MG tablet Take 10 mg by mouth at bedtime.    Historical Provider, MD  predniSONE (DELTASONE) 20 MG tablet Take 3 tablets (60 mg total) by mouth daily. 04/01/14   Heather Laisure, PA-C   BP 139/78 mmHg  Pulse 92  Temp(Src) 97.8 F (36.6 C) (Oral)  Resp 18  Ht 5\' 8"  (1.727 m)  Wt 204 lb 12.8 oz (92.897 kg)  BMI 31.15 kg/m2  SpO2 100%  LMP 11/13/2014  Breastfeeding? Unknown Physical Exam  Constitutional: She is oriented to person, place, and time. She appears well-developed and well-nourished. No distress.  HENT:  Head: Normocephalic and atraumatic.  Right Ear: Tympanic membrane, external ear and ear canal normal.  Left Ear: Tympanic membrane, external ear and ear canal normal.  Nose: Nose normal. No rhinorrhea. Right sinus exhibits no maxillary sinus tenderness and no frontal sinus tenderness. Left sinus exhibits no maxillary sinus tenderness and no frontal sinus tenderness.  Mouth/Throat: Uvula is midline and mucous membranes are normal. No trismus in the jaw. Normal dentition. No dental abscesses or uvula swelling.  No oropharyngeal exudate, posterior oropharyngeal edema, posterior oropharyngeal erythema or tonsillar abscesses.  No submental edema, tongue not elevated, no trismus. No impending airway obstruction; Pt able to speak full sentences, swallow intact, no drooling, stridor, or tonsillar/uvula displacement. No palatal petechia  Eyes: Conjunctivae and EOM are normal.  Neck: Trachea normal, normal range of motion and full passive range of motion without pain. Neck supple. No rigidity. No tracheal deviation and normal range of motion present. No Brudzinski's sign noted.   Flexion and extension of neck without pain or difficulty. Able to breath without difficulty in extension.  Cardiovascular: Normal rate and regular rhythm.   Pulmonary/Chest: Effort normal and breath sounds normal. No stridor. No respiratory distress. She has no wheezes.  Abdominal: Soft. There is no tenderness.  No obvious evidence of splenomegaly. Non ttp.   Musculoskeletal: Normal range of motion.  Lymphadenopathy:       Head (right side): No preauricular and no posterior auricular adenopathy present.       Head (left side): No preauricular and no posterior auricular adenopathy present.    She has no cervical adenopathy.  Neurological: She is alert and oriented to person, place, and time.  Skin: Skin is warm and dry. No rash noted. She is not diaphoretic.  Psychiatric: She has a normal mood and affect. Her behavior is normal.  Nursing note and vitals reviewed.   ED Course  Procedures (including critical care time) DIAGNOSTIC STUDIES: Oxygen Saturation is 100% on RA, nl by my interpretation.    COORDINATION OF CARE: 3:48 PM-Discussed treatment plan which includes rapid strep screen and culture, rocephin injection, azithromycin, with pt at bedside and pt agreed to plan.   Labs Review Labs Reviewed  CULTURE, GROUP A STREP - Abnormal; Notable for the following:    Strep A Culture Comment (*)    All other components within normal limits  RAPID STREP SCREEN (NOT AT [facility])  GC/CHLAMYDIA PROBE AMP ([place]) NOT AT [facility]    Imaging Review No results found.   EKG Interpretation None      MDM   Final diagnoses:  Throat pain    Medications  azithromycin (ZITHROMAX) tablet 1,000 mg (1,000 mg Oral Given 12/03/14 1618)  ondansetron (ZOFRAN-ODT) disintegrating tablet 4 mg (4 mg Oral Given 12/03/14 1619)  lidocaine (PF) (XYLOCAINE) 1 % injection 0.9 mL (0.9 mLs Other Given 12/03/14 1619)   Pt concern for STD in throat with pain on swallowing in the context of oral sex without  protection. Pt given sex education and the use of appropriate protection. She prefers to be treated while cultures are pending.  24 [facility]'s evaluation in the Emergency Department is complete. It has been determined that no acute conditions requiring further emergency intervention are present at this time. The patient/guardian have been advised of the diagnosis and plan. We have discussed signs and symptoms that warrant return to the ED, such as changes or worsening in symptoms.  Vital signs are stable at discharge. Filed Vitals:   12/03/14 1615  BP: 139/78  Pulse: 92  Temp: 97.8 F (36.6 C)  Resp:     Patient/guardian has voiced understanding and agreed to follow-up with the PCP or specialist.   I personally performed the services described in this documentation, which was scribed in my presence. The recorded information has been reviewed and is accurate.     Marlon Pel, PA-C 12/08/14 1013  Purvis Sheffield, MD 12/09/14 [phone]

## 2014-12-03 NOTE — Discharge Instructions (Signed)
Salt Water Gargle  This solution will help make your mouth and throat feel better.  HOME CARE INSTRUCTIONS   · Mix 1 teaspoon of salt in 8 ounces of warm water.  · Gargle with this solution as much or often as you need or as directed. Swish and gargle gently if you have any sores or wounds in your mouth.  · Do not swallow this mixture.  Document Released: 03/13/2004 Document Revised: 09/01/2011 Document Reviewed: 08/04/2008  ExitCare® Patient Information ©2015 [employer]. This information is not intended to replace advice given to you by your health care provider. Make sure you discuss any questions you have with your health care provider.

## 2014-12-03 NOTE — ED Notes (Signed)
Pt. Stated, I've had a sore throat for 2 days no other symptoms

## 2014-12-06 LAB — CULTURE, GROUP A STREP

## 2014-12-08 LAB — GC/CHLAMYDIA PROBE AMP (~~LOC~~) NOT AT ARMC
Chlamydia: NEGATIVE
Neisseria Gonorrhea: NEGATIVE

## 2016-02-12 ENCOUNTER — Encounter (HOSPITAL_COMMUNITY): Payer: Self-pay | Admitting: Emergency Medicine

## 2016-02-12 ENCOUNTER — Emergency Department (HOSPITAL_COMMUNITY): Payer: Self-pay

## 2016-02-12 ENCOUNTER — Emergency Department (HOSPITAL_COMMUNITY)
Admission: EM | Admit: 2016-02-12 | Discharge: 2016-02-12 | Disposition: A | Discharge status: Home / Self Care | Payer: Self-pay | Attending: Emergency Medicine | Admitting: Emergency Medicine

## 2016-02-12 DIAGNOSIS — L03012 Cellulitis of left finger: Secondary | ICD-10-CM

## 2016-02-12 DIAGNOSIS — J45909 Unspecified asthma, uncomplicated: Secondary | ICD-10-CM | POA: Insufficient documentation

## 2016-02-12 DIAGNOSIS — Z87891 Personal history of nicotine dependence: Secondary | ICD-10-CM | POA: Insufficient documentation

## 2016-02-12 DIAGNOSIS — L0291 Cutaneous abscess, unspecified: Secondary | ICD-10-CM

## 2016-02-12 DIAGNOSIS — L02512 Cutaneous abscess of left hand: Secondary | ICD-10-CM | POA: Insufficient documentation

## 2016-02-12 LAB — POC URINE PREG, ED: PREG TEST UR: NEGATIVE

## 2016-02-12 MED ORDER — CEPHALEXIN 250 MG PO CAPS
1000.0000 mg | ORAL_CAPSULE | Freq: Once | ORAL | Status: AC
  Administered 2016-02-12: 1000 mg via ORAL
  Filled 2016-02-12: qty 4

## 2016-02-12 MED ORDER — LIDOCAINE HCL (PF) 1 % IJ SOLN
5.0000 mL | Freq: Once | INTRAMUSCULAR | Status: DC
  Filled 2016-02-12: qty 5

## 2016-02-12 MED ORDER — CEPHALEXIN 500 MG PO CAPS: ORAL_CAPSULE | ORAL | 0 refills | Status: DC

## 2016-02-12 NOTE — ED Notes (Signed)
Pt ambulated to bathroom and back to room with no problem.  Tolerated well.

## 2016-02-12 NOTE — ED Triage Notes (Signed)
Pt. reports abscess at left proximal 4th finger with swelling and drainage onset 2 weeks ago .

## 2016-02-12 NOTE — ED Provider Notes (Signed)
[facility]-EMERGENCY DEPT Provider Note   CSN: [id number] Arrival date & time: 02/12/16  0533     History   Chief Complaint Chief Complaint  Patient presents with  . Abscess    Left 4th Finger    HPI Kathleen [name] is a 25 y.o. female.  25 yo F w/ abscess to right ring finger volar surface for a few days. Tried creams without help. Warm water did help. No h/o same. Is left handed.       Past Medical History:  Diagnosis Date  . Asthma     There are no active problems to display for this patient.   Past Surgical History:  Procedure Laterality Date  . DILATION AND EVACUATION N/A 12/16/2012   Procedure: DILATATION AND EVACUATION;  Surgeon: Serita KyleSheronette A Cousins, MD;  Location: [facility];  Service: Gynecology;  Laterality: N/A;  . NO PAST SURGERIES    . WISDOM TOOTH EXTRACTION  2009    OB History    Gravida Para Term Preterm AB Living   2             SAB TAB Ectopic Multiple Live Births                   Home Medications    Prior to Admission medications   Medication Sig Start Date End Date Taking? Authorizing Provider  albuterol (PROVENTIL HFA;VENTOLIN HFA) 108 (90 BASE) MCG/ACT inhaler Inhale 2 puffs into the lungs every 6 (six) hours as needed. For wheezing and shortness of breath.    Historical Provider, MD  cephALEXin (KEFLEX) 500 MG capsule 2 caps po bid x 7 days 02/12/16   Marily MemosJason Price Lachapelle, MD  montelukast (SINGULAIR) 10 MG tablet Take 10 mg by mouth at bedtime.    Historical Provider, MD  predniSONE (DELTASONE) 20 MG tablet Take 3 tablets (60 mg total) by mouth daily. 04/01/14   Santiago GladHeather Laisure, PA-C    Family History No family history on file.  Social History Social History  Substance Use Topics  . Smoking status: Former Games developermoker  . Smokeless tobacco: Not on file  . Alcohol use No     Allergies   Review of patient's allergies indicates no known allergies.   Review of Systems Review of Systems  All other systems reviewed and are negative.    Physical  Exam Updated Vital Signs BP 102/55 (BP Location: Right Arm)   Pulse 80   Temp 98.2 F (36.8 C) (Oral)   Resp 18   Ht 5\' 7"  (1.702 m)   Wt 202 lb (91.6 kg)   LMP 01/11/2016   SpO2 98%   BMI 31.64 kg/m   Physical Exam  Constitutional: She appears well-developed and well-nourished. No distress.  HENT:  Head: Normocephalic and atraumatic.  Eyes: Conjunctivae are normal.  Neck: Neck supple.  Cardiovascular: Normal rate and regular rhythm.   No murmur heard. Pulmonary/Chest: Effort normal and breath sounds normal. No respiratory distress.  Abdominal: Soft. There is no tenderness.  Musculoskeletal: She exhibits no edema.  Neurological: She is alert.  Skin: Skin is warm and dry.  1.5 cm draining abscess over proximal phalynx of right fourth digit with mild surrounding cellulitis.   Psychiatric: She has a normal mood and affect.  Nursing note and vitals reviewed.    ED Treatments / Results  Labs (all labs ordered are listed, but only abnormal results are displayed) Labs Reviewed  POC URINE PREG, ED    EKG  EKG Interpretation None  Radiology Dg Finger Ring Left  Result Date: 02/12/2016 CLINICAL DATA:  Abscess, swelling, and drainage of the left ring finger. EXAM: LEFT RING FINGER 2+V COMPARISON:  None. FINDINGS: Diffuse soft tissue edema of the ring finger most prominent proximally and about the volar aspect. No subcutaneous air or radiopaque foreign body. No osseous abnormality. No fracture, dislocation, periosteal reaction or bony destructive change. IMPRESSION: Soft tissue edema of the index finger. No radiopaque foreign body, soft tissue air, or osseous abnormality. Electronically Signed   By: Rubye OaksMelanie  Ehinger M.D.   On: 02/12/2016 06:06    Procedures .Marland Kitchen.Incision and Drainage Date/Time: 02/12/2016 8:57 AM Performed by: Marily MemosMESNER, Tauna Macfarlane Authorized by: Marily MemosMESNER, Kaaren Nass   Consent:    Consent obtained:  Verbal   Consent given by:  Patient   Risks discussed:  Pain,  infection and incomplete drainage   Alternatives discussed:  No treatment Location:    Type:  Abscess   Size:  1.5 cm   Location:  Upper extremity   Upper extremity location:  Finger   Finger location:  L ring finger Pre-procedure details:    Skin preparation:  Betadine Anesthesia (see MAR for exact dosages):    Anesthesia method:  Local infiltration   Local anesthetic:  Lidocaine 1% w/o epi Procedure type:    Complexity:  Simple Procedure details:    Needle aspiration: no     Incision depth:  Dermal   Scalpel blade:  11   Wound management:  Irrigated with saline and debrided   Drainage:  Purulent   Drainage amount:  Moderate   Wound treatment:  Wound left open   Packing materials:  None Post-procedure details:    Patient tolerance of procedure:  Tolerated well, no immediate complications   (including critical care time)  Medications Ordered in ED Medications  lidocaine (PF) (XYLOCAINE) 1 % injection 5 mL (not administered)  cephALEXin (KEFLEX) capsule 1,000 mg (not administered)     Initial Impression / Assessment and Plan / ED Course  I have reviewed the triage vital signs and the nursing notes.  Pertinent labs & imaging results that were available during my care of the patient were reviewed by me and considered in my medical decision making (see chart for details).  Clinical Course   Abscess on dominant hand, NVI, full ROM. Will I/D w/ abx after upreg with hand follow up.    Final Clinical Impressions(s) / ED Diagnoses   Final diagnoses:  Abscess  Cellulitis of finger of left hand    New Prescriptions New Prescriptions   CEPHALEXIN (KEFLEX) 500 MG CAPSULE    2 caps po bid x 7 days     Marily MemosJason Aaiden Depoy, MD 02/12/16 [phone]

## 2018-06-26 ENCOUNTER — Encounter (HOSPITAL_COMMUNITY): Payer: Self-pay

## 2018-06-26 ENCOUNTER — Emergency Department (HOSPITAL_COMMUNITY)
Admission: EM | Admit: 2018-06-26 | Discharge: 2018-06-26 | Disposition: A | Discharge status: Home / Self Care | Payer: Self-pay | Attending: Emergency Medicine | Admitting: Emergency Medicine

## 2018-06-26 DIAGNOSIS — Z79899 Other long term (current) drug therapy: Secondary | ICD-10-CM | POA: Insufficient documentation

## 2018-06-26 DIAGNOSIS — H109 Unspecified conjunctivitis: Secondary | ICD-10-CM | POA: Insufficient documentation

## 2018-06-26 DIAGNOSIS — J45909 Unspecified asthma, uncomplicated: Secondary | ICD-10-CM | POA: Insufficient documentation

## 2018-06-26 DIAGNOSIS — Z87891 Personal history of nicotine dependence: Secondary | ICD-10-CM | POA: Insufficient documentation

## 2018-06-26 MED ORDER — TOBRAMYCIN 0.3 % OP SOLN: 1.0000 [drp] | OPHTHALMIC | 0 refills | Status: DC

## 2018-06-26 NOTE — ED Notes (Signed)
Patient verbalizes understanding of discharge instructions. Opportunity for questioning and answers were provided. Pt discharged from ED. 

## 2018-06-26 NOTE — ED Triage Notes (Signed)
Pt presents for R eye conjunctivitis x 2 days. Denies pain, burning, itching. Reports some thick brown discharge. Boyfriend had similar symptoms and dx with pink eye.

## 2018-06-26 NOTE — Discharge Instructions (Addendum)
Follow up at Urgent care in 3-4 days if symptoms persist  warm compresses

## 2018-06-26 NOTE — ED Provider Notes (Signed)
[facility] EMERGENCY DEPARTMENT Provider Note   CSN: [id number] Arrival date & time: 06/26/18  0807     History   Chief Complaint Chief Complaint  Patient presents with  . Conjunctivitis    HPI Kathleen [name] is a 28 y.o. female.  The history is provided by the patient. No language interpreter was used.  Conjunctivitis  This is a new problem. The current episode started 2 days ago. The problem occurs constantly. The problem has been gradually worsening. Nothing aggravates the symptoms. Nothing relieves the symptoms. She has tried nothing for the symptoms.   Pt complains of right eye redness, Pt reports exposure to the same Past Medical History:  Diagnosis Date  . Asthma     There are no active problems to display for this patient.   Past Surgical History:  Procedure Laterality Date  . DILATION AND EVACUATION N/A 12/16/2012   Procedure: DILATATION AND EVACUATION;  Surgeon: Serita Kyle, MD;  Location: [facility];  Service: Gynecology;  Laterality: N/A;  . NO PAST SURGERIES    . WISDOM TOOTH EXTRACTION  2009     OB History    Gravida  2   Para      Term      Preterm      AB      Living        SAB      TAB      Ectopic      Multiple      Live Births               Home Medications    Prior to Admission medications   Medication Sig Start Date End Date Taking? Authorizing Provider  albuterol (PROVENTIL HFA;VENTOLIN HFA) 108 (90 BASE) MCG/ACT inhaler Inhale 2 puffs into the lungs every 6 (six) hours as needed. For wheezing and shortness of breath.    [provider]  cephALEXin (KEFLEX) 500 MG capsule 2 caps po bid x 7 days 02/12/16   Mesner, Barbara Cower, MD  montelukast (SINGULAIR) 10 MG tablet Take 10 mg by mouth at bedtime.    [provider]  predniSONE (DELTASONE) 20 MG tablet Take 3 tablets (60 mg total) by mouth daily. 04/01/14   Santiago Glad, PA-C  tobramycin (TOBREX) 0.3 % ophthalmic solution Place 1  drop into the right eye every 4 (four) hours. 06/26/18   Elson Areas, PA-C    Family History No family history on file.  Social History Social History   Tobacco Use  . Smoking status: Former Smoker  Substance Use Topics  . Alcohol use: No  . Drug use: Yes    Types: Marijuana     Allergies   Patient has no known allergies.   Review of Systems Review of Systems  Constitutional: Negative for fever.  HENT: Negative for congestion.   Eyes: Negative for photophobia.  All other systems reviewed and are negative.    Physical Exam Updated Vital Signs BP 137/87 (BP Location: Right Arm)   Pulse (!) 109   Temp 99 F (37.2 C) (Oral)   Resp 15   LMP 05/10/2018 (Approximate)   SpO2 100%   Physical Exam Vitals signs and nursing note reviewed.  Constitutional:      Appearance: She is well-developed.  HENT:     Head: Normocephalic.     Right Ear: Tympanic membrane normal.     Nose: Nose normal.  Eyes:     Pupils: Pupils are equal, round, and  reactive to light.     Comments: Injected right conjunctiva   Neck:     Musculoskeletal: Normal range of motion.  Cardiovascular:     Rate and Rhythm: Normal rate.     Pulses: Normal pulses.  Pulmonary:     Effort: Pulmonary effort is normal.  Abdominal:     General: There is no distension.  Musculoskeletal: Normal range of motion.  Skin:    General: Skin is warm.  Neurological:     Mental Status: She is alert and oriented to person, place, and time.  Psychiatric:        Mood and Affect: Mood normal.      ED Treatments / Results  Labs (all labs ordered are listed, but only abnormal results are displayed) Labs Reviewed - No data to display  EKG None  Radiology No results found.  Procedures Procedures (including critical care time)  Medications Ordered in ED Medications - No data to display   Initial Impression / Assessment and Plan / ED Course  I have reviewed the triage vital signs and the nursing  notes.  Pertinent labs & imaging results that were available during my care of the patient were reviewed by me and considered in my medical decision making (see chart for details).     MDM  Pt counseled on probable viral illness.  I will treat conjunctivitis   Final Clinical Impressions(s) / ED Diagnoses   Final diagnoses:  Conjunctivitis of right eye, unspecified conjunctivitis type    ED Discharge Orders         Ordered    tobramycin (TOBREX) 0.3 % ophthalmic solution  Every 4 hours     06/26/18 0851        An After Visit Summary was printed and given to the patient.    Osie Cheeks 06/26/18 1214    Benjiman Core, MD 06/26/18 [phone]

## 2018-06-30 ENCOUNTER — Encounter (HOSPITAL_COMMUNITY): Payer: Self-pay

## 2018-06-30 ENCOUNTER — Emergency Department (HOSPITAL_COMMUNITY)
Admission: EM | Admit: 2018-06-30 | Discharge: 2018-07-01 | Disposition: A | Discharge status: Home / Self Care | Payer: Self-pay | Attending: Emergency Medicine | Admitting: Emergency Medicine

## 2018-06-30 DIAGNOSIS — B309 Viral conjunctivitis, unspecified: Secondary | ICD-10-CM | POA: Insufficient documentation

## 2018-06-30 NOTE — ED Triage Notes (Signed)
Pt was seen here 4 days ago for conjunctivitis, was given eye drops and is now having severe redness, some itchiness and drainage. Pt denies any vision changes.

## 2018-07-01 MED ORDER — KETOTIFEN FUMARATE 0.025 % OP SOLN: 1.0000 [drp] | Freq: Two times a day (BID) | OPHTHALMIC | 0 refills | Status: DC

## 2018-07-01 MED ORDER — LEVOCETIRIZINE DIHYDROCHLORIDE 5 MG PO TABS: 5.0000 mg | ORAL_TABLET | Freq: Every evening | ORAL | 0 refills | Status: DC

## 2018-07-01 NOTE — ED Provider Notes (Signed)
[facility] Provider [facility]   CSN: [id number] Arrival date & time: 06/30/18  2229     History   Chief Complaint Chief Complaint  Patient presents with  . Eye Pain    HPI Kathleen [name] is a 28 y.o. female who presents emergency department bilateral conjunctivitis.  She was seen 5 days ago for conjunctivitis of the right eye and was started on tobramycin.  She has been using her eyedrops however 2 days ago her left eye began getting red and irritated.  She states that her boyfriend had the same symptoms a week ago and has resolved.  She complains that her eyes are very itchy she wakes up with lash mattering.  She denies any changes in vision.  She does not wear contacts.  She smoked marijuana earlier and it made her eye symptoms worse.  HPI  Past Medical History:  Diagnosis Date  . Asthma     There are no active problems to display for this patient.   Past Surgical History:  Procedure Laterality Date  . DILATION AND EVACUATION N/A 12/16/2012   Procedure: DILATATION AND EVACUATION;  Surgeon: Serita KyleSheronette A Cousins, MD;  Location: [facility];  Service: Gynecology;  Laterality: N/A;  . NO PAST SURGERIES    . WISDOM TOOTH EXTRACTION  2009     OB History    Gravida  2   Para      Term      Preterm      AB      Living        SAB      TAB      Ectopic      Multiple      Live Births               Home Medications    Prior to Admission medications   Medication Sig Start Date End Date Taking? Authorizing Provider  albuterol (PROVENTIL HFA;VENTOLIN HFA) 108 (90 BASE) MCG/ACT inhaler Inhale 2 puffs into the lungs every 6 (six) hours as needed. For wheezing and shortness of breath.    [provider]  cephALEXin (KEFLEX) 500 MG capsule 2 caps po bid x 7 days 02/12/16   Mesner, Barbara CowerJason, MD  montelukast (SINGULAIR) 10 MG tablet Take 10 mg by mouth at bedtime.    [provider]  predniSONE (DELTASONE) 20 MG tablet  Take 3 tablets (60 mg total) by mouth daily. 04/01/14   Santiago GladLaisure, Heather, PA-C  tobramycin (TOBREX) 0.3 % ophthalmic solution Place 1 drop into the right eye every 4 (four) hours. 06/26/18   Elson AreasSofia, Leslie K, PA-C    Family History No family history on file.  Social History Social History   Tobacco Use  . Smoking status: Former Smoker  Substance Use Topics  . Alcohol use: No  . Drug use: Yes    Types: Marijuana     Allergies   Patient has no known allergies.   Review of Systems Review of Systems Ten systems reviewed and are negative for acute change, except as noted in the HPI.    Physical Exam Updated Vital Signs BP (!) 140/91 (BP Location: Right Arm)   Pulse (!) 106   Temp (!) 97.5 F (36.4 C) (Oral)   Resp 16   SpO2 100%   Physical Exam Vitals signs and nursing note reviewed.  Constitutional:      General: She is not in acute distress.    Appearance: She is well-developed. She is  not diaphoretic.  HENT:     Head: Normocephalic and atraumatic.  Eyes:     General: Lids are normal. Vision grossly intact. No scleral icterus.    Extraocular Movements: Extraocular movements intact.     Conjunctiva/sclera:     Right eye: Right conjunctiva is injected. No chemosis or exudate.    Left eye: Left conjunctiva is injected. No chemosis or exudate.    Comments: Eyes: both eyes with findings of typical conjunctivitis noted; erythema and watery discharge. PERRLA, no foreign body noted. No periorbital cellulitis. The corneas are clear and fundi normal. Visual acuity normal.   Neck:     Musculoskeletal: Normal range of motion.  Cardiovascular:     Rate and Rhythm: Normal rate and regular rhythm.     Heart sounds: Normal heart sounds. No murmur. No friction rub. No gallop.   Pulmonary:     Effort: Pulmonary effort is normal. No respiratory distress.     Breath sounds: Normal breath sounds.  Abdominal:     General: Bowel sounds are normal. There is no distension.      Palpations: Abdomen is soft. There is no mass.     Tenderness: There is no abdominal tenderness. There is no guarding.  Skin:    General: Skin is warm and dry.  Neurological:     Mental Status: She is alert and oriented to person, place, and time.  Psychiatric:        Behavior: Behavior normal.      ED Treatments / Results  Labs (all labs ordered are listed, but only abnormal results are displayed) Labs Reviewed - No data to display  EKG None  Radiology No results found.  Procedures Procedures (including critical care time)  Medications Ordered in ED Medications - No data to display   Initial Impression / Assessment and Plan / ED Course  I have reviewed the triage vital signs and the nursing notes.  Pertinent labs & imaging results that were available during my care of the patient were reviewed by me and considered in my medical decision making (see chart for details).     Patient with bilateral conjunctivitis.  Suspect allergic versus more likely viral conjunctivitis.  Patient will be discharged with Xyzal and ketotifen.  Patient inquiring about STD testing and given referral for outpatient free STD testing.  She appears appropriate for discharge at this time and is asymptomatic vaginally.  Final Clinical Impressions(s) / ED Diagnoses   Final diagnoses:  Acute viral conjunctivitis of both eyes    ED Discharge Orders    None       Arthor CaptainHarris, Jaleesa Cervi, PA-C 07/01/18 0443    Dione BoozeGlick, David, MD 07/01/18 [phone]

## 2018-07-01 NOTE — Discharge Instructions (Addendum)
Contact a health care provider if: Your symptoms do not improve with treatment or they get worse. You have increased pain. Your vision becomes blurry. You have a fever. You have facial pain, redness, or swelling. You have yellow or green drainage coming from your eye. You have new symptoms.   Free HIV and STD Testing These locations offer FREE confidential testing for HIV, Chlamydia, Gonorrhea, and Syphilis. Non-Traditional Testing Sites Address Telephone  [facility] [address], [place] [phone] Mondays 5pm - 7pm  [facility] [address] [place] [phone] Wednesdays 2pm-8pm  [facility] [address], [place] [phone] Thursdays 9am-12noon 1pm-4pm  [facility] [address], [place] [phone] Tuesdays Thursdays 9am-12noon 1pm-4pm  [facility] offers free, confidential testing and treatment for HIV, Chlamydia, Gonorrhea, Syphilis, Herpes, Bacterial Vaginosis, Yeast, and Trichomoniasis. Traditional Testing   [facility] [address], [place] [phone]  Monday thru Friday  Call for an appointment  [facility] [address] [place] [phone] Monday thru Friday  Call for anappointment.  If you have any questions about this information please call [phone]. 05/01/2011

## 2018-12-13 ENCOUNTER — Ambulatory Visit (HOSPITAL_COMMUNITY)
Admission: EM | Admit: 2018-12-13 | Discharge: 2018-12-13 | Disposition: A | Discharge status: Home / Self Care | Payer: Self-pay | Attending: Family Medicine | Admitting: Family Medicine

## 2018-12-13 ENCOUNTER — Encounter (HOSPITAL_COMMUNITY): Payer: Self-pay | Admitting: Emergency Medicine

## 2018-12-13 ENCOUNTER — Other Ambulatory Visit: Payer: Self-pay

## 2018-12-13 DIAGNOSIS — R509 Fever, unspecified: Secondary | ICD-10-CM

## 2018-12-13 DIAGNOSIS — Z3202 Encounter for pregnancy test, result negative: Secondary | ICD-10-CM

## 2018-12-13 DIAGNOSIS — M791 Myalgia, unspecified site: Secondary | ICD-10-CM

## 2018-12-13 DIAGNOSIS — J029 Acute pharyngitis, unspecified: Secondary | ICD-10-CM | POA: Insufficient documentation

## 2018-12-13 DIAGNOSIS — J351 Hypertrophy of tonsils: Secondary | ICD-10-CM

## 2018-12-13 DIAGNOSIS — R1030 Lower abdominal pain, unspecified: Secondary | ICD-10-CM

## 2018-12-13 DIAGNOSIS — N938 Other specified abnormal uterine and vaginal bleeding: Secondary | ICD-10-CM

## 2018-12-13 LAB — POCT PREGNANCY, URINE: Preg Test, Ur: NEGATIVE

## 2018-12-13 LAB — POCT RAPID STREP A: Streptococcus, Group A Screen (Direct): NEGATIVE

## 2018-12-13 MED ORDER — DEXAMETHASONE SODIUM PHOSPHATE 10 MG/ML IJ SOLN
10.0000 mg | Freq: Once | INTRAMUSCULAR | Status: AC
Start: 2018-12-13 — End: 2018-12-13
  Administered 2018-12-13: 14:00:00 10 mg via INTRAMUSCULAR

## 2018-12-13 MED ORDER — PENICILLIN G BENZATHINE 1200000 UNIT/2ML IM SUSP
INTRAMUSCULAR | Status: AC
  Filled 2018-12-13: qty 2

## 2018-12-13 MED ORDER — DEXAMETHASONE SODIUM PHOSPHATE 10 MG/ML IJ SOLN
INTRAMUSCULAR | Status: AC
  Filled 2018-12-13: qty 1

## 2018-12-13 MED ORDER — PENICILLIN G BENZATHINE 1200000 UNIT/2ML IM SUSP
1.2000 10*6.[IU] | Freq: Once | INTRAMUSCULAR | Status: AC
  Administered 2018-12-13: 1.2 10*6.[IU] via INTRAMUSCULAR

## 2018-12-13 NOTE — Discharge Instructions (Signed)
We are treating you for strep throat today with antibiotics and steroid here in clinic. Work note given to return on Wednesday if feeling better Follow up as needed for continued or worsening symptoms

## 2018-12-13 NOTE — ED Provider Notes (Signed)
[facility]    CSN: [id number] Arrival date & time: 12/13/18  1228     History   Chief Complaint Chief Complaint  Patient presents with  . Sore Throat  . Fever    HPI Kathleen [name] is a 28 y.o. female.   Patient is a 28 year old female presents today with sore throat, fever, body aches, chills and swollen tonsils.  This is been present worsening over the past couple days.  History of strep.  She is having trouble swallowing.  No trouble holding secretions or trismus.  Denies any associated cough, chest congestion, rhinorrhea, ear pain.  No known recent sick contacts or COVID exposures. She is also had some vaginal spotting and did not have been normal menstrual cycle this month.  Reporting shorter than normal.  She has had some mild lower abdominal cramping.  There is some concern for pregnancy.  Denies any vaginal discharge, itching or irritation.  Denies any concern for STDs.  ROS per HPI      Past Medical History:  Diagnosis Date  . Asthma     There are no active problems to display for this patient.   Past Surgical History:  Procedure Laterality Date  . DILATION AND EVACUATION N/A 12/16/2012   Procedure: DILATATION AND EVACUATION;  Surgeon: Serita KyleSheronette A Cousins, MD;  Location: [facility];  Service: Gynecology;  Laterality: N/A;  . NO PAST SURGERIES    . WISDOM TOOTH EXTRACTION  2009    OB History    Gravida  2   Para      Term      Preterm      AB      Living        SAB      TAB      Ectopic      Multiple      Live Births               Home Medications    Prior to Admission medications   Medication Sig Start Date End Date Taking? Authorizing Provider  albuterol (PROVENTIL HFA;VENTOLIN HFA) 108 (90 BASE) MCG/ACT inhaler Inhale 2 puffs into the lungs every 6 (six) hours as needed. For wheezing and shortness of breath.    [provider]  cephALEXin (KEFLEX) 500 MG capsule 2 caps po bid x 7 days Patient not taking:  Reported on 12/13/2018 02/12/16   Mesner, Barbara CowerJason, MD  ketotifen (ZADITOR) 0.025 % ophthalmic solution Place 1 drop into both eyes 2 (two) times daily. 07/01/18   Harris, Cammy CopaAbigail, PA-C  levocetirizine (XYZAL) 5 MG tablet Take 1 tablet (5 mg total) by mouth every evening. 07/01/18   Harris, Abigail, PA-C  montelukast (SINGULAIR) 10 MG tablet Take 10 mg by mouth at bedtime.    [provider]  predniSONE (DELTASONE) 20 MG tablet Take 3 tablets (60 mg total) by mouth daily. Patient not taking: Reported on 12/13/2018 04/01/14   Santiago GladLaisure, Heather, PA-C    Family History Family History  Problem Relation Age of Onset  . Cancer Mother     Social History Social History   Tobacco Use  . Smoking status: Former Games developermoker  . Smokeless tobacco: Never Used  Substance Use Topics  . Alcohol use: No  . Drug use: Yes    Types: Marijuana     Allergies   Patient has no known allergies.   Review of Systems Review of Systems   Physical Exam Triage Vital Signs ED Triage Vitals [12/13/18 1303]  Enc  Vitals Group     BP 139/87     Pulse Rate (!) 115     Resp 18     Temp 99.8 F (37.7 C)     Temp Source Oral     SpO2 97 %     Weight      Height      Head Circumference      Peak Flow      Pain Score 8     Pain Loc      Pain Edu?      Excl. in [place]?    No data found.  Updated Vital Signs BP 139/87 (BP Location: Right Arm)   Pulse (!) 115   Temp 99.8 F (37.7 C) (Oral)   Resp 18   SpO2 97%   Visual Acuity Right Eye Distance:   Left Eye Distance:   Bilateral Distance:    Right Eye Near:   Left Eye Near:    Bilateral Near:     Physical Exam Constitutional:      Appearance: She is well-developed.  HENT:     Head: Normocephalic and atraumatic.     Mouth/Throat:     Pharynx: Uvula midline. Posterior oropharyngeal erythema present.     Tonsils: Tonsillar exudate present. 4+ on the right. 4+ on the left.  Neck:     Musculoskeletal: Normal range of motion.  Pulmonary:      Effort: Pulmonary effort is normal.  Lymphadenopathy:     Cervical: Cervical adenopathy present.  Skin:    General: Skin is warm and dry.  Neurological:     Mental Status: She is alert.  Psychiatric:        Mood and Affect: Mood normal.      UC Treatments / Results  Labs (all labs ordered are listed, but only abnormal results are displayed) Labs Reviewed  CULTURE, GROUP A STREP [facility])  POCT RAPID STREP A  POC URINE PREG, ED  POCT PREGNANCY, URINE    EKG None  Radiology No results found.  Procedures Procedures (including critical care time)  Medications Ordered in UC Medications  penicillin g benzathine (BICILLIN LA) 1200000 UNIT/2ML injection 1.2 Million Units (1.2 Million Units Intramuscular Given 12/13/18 1354)  dexamethasone (DECADRON) injection 10 mg (10 mg Intramuscular Given 12/13/18 1354)  dexamethasone (DECADRON) 10 MG/ML injection (has no administration in time range)  penicillin g benzathine (BICILLIN LA) 1200000 UNIT/2ML injection (has no administration in time range)    Initial Impression / Assessment and Plan / UC Course  I have reviewed the triage vital signs and the nursing notes.  Pertinent labs & imaging results that were available during my care of the patient were reviewed by me and considered in my medical decision making (see chart for details).     Rapid strep test negative.  Opting to treat based on exam with severe tonsillar swelling, exudates and fever Pregnancy test negative here Will treat with dexamethasone injection and penicillin here in clinic Follow up as needed for continued or worsening symptoms  Final Clinical Impressions(s) / UC Diagnoses   Final diagnoses:  Pharyngitis, unspecified etiology     Discharge Instructions     We are treating you for strep throat today with antibiotics and steroid here in clinic. Work note given to return on Wednesday if feeling better Follow up as needed for continued or worsening symptoms      ED Prescriptions    None     Controlled Substance Prescriptions [place] Controlled Substance Registry  consulted? Not Applicable   Janace ArisBast, Cymone Yeske A, NP 12/13/18 1526

## 2018-12-13 NOTE — ED Triage Notes (Signed)
Pt here for sore throat and fever with swollen tonsils; pt sts hx of same in past; pt also sts some vaginal spotting

## 2018-12-14 LAB — CULTURE, GROUP A STREP (THRC)

## 2019-02-14 LAB — OB RESULTS CONSOLE ANTIBODY SCREEN: Antibody Screen: NEGATIVE

## 2019-02-14 LAB — OB RESULTS CONSOLE ABO/RH: RH Type: POSITIVE

## 2019-02-14 LAB — OB RESULTS CONSOLE HGB/HCT, BLOOD
HCT: 42 — AB (ref 29–41)
Hemoglobin: 13.5

## 2019-02-14 LAB — AMB REFERRAL TO OB-GYN
Glucose, 1 hour: 79
Pap: NEGATIVE

## 2019-02-14 LAB — OB RESULTS CONSOLE PLATELET COUNT: Platelets: 270

## 2019-02-17 ENCOUNTER — Other Ambulatory Visit: Payer: Self-pay

## 2019-02-17 ENCOUNTER — Encounter (HOSPITAL_COMMUNITY): Payer: Self-pay | Admitting: *Deleted

## 2019-02-17 ENCOUNTER — Inpatient Hospital Stay (HOSPITAL_COMMUNITY): Payer: Medicaid Other

## 2019-02-17 ENCOUNTER — Inpatient Hospital Stay (HOSPITAL_COMMUNITY)
Admission: AD | Admit: 2019-02-17 | Discharge: 2019-02-17 | Disposition: A | Discharge status: Home / Self Care | Payer: Medicaid Other | Attending: Obstetrics and Gynecology | Admitting: Obstetrics and Gynecology

## 2019-02-17 DIAGNOSIS — R109 Unspecified abdominal pain: Secondary | ICD-10-CM | POA: Diagnosis not present

## 2019-02-17 DIAGNOSIS — O98811 Other maternal infectious and parasitic diseases complicating pregnancy, first trimester: Secondary | ICD-10-CM | POA: Diagnosis not present

## 2019-02-17 DIAGNOSIS — R1030 Lower abdominal pain, unspecified: Secondary | ICD-10-CM | POA: Diagnosis not present

## 2019-02-17 DIAGNOSIS — O99511 Diseases of the respiratory system complicating pregnancy, first trimester: Secondary | ICD-10-CM | POA: Insufficient documentation

## 2019-02-17 DIAGNOSIS — Z79899 Other long term (current) drug therapy: Secondary | ICD-10-CM | POA: Insufficient documentation

## 2019-02-17 DIAGNOSIS — O161 Unspecified maternal hypertension, first trimester: Secondary | ICD-10-CM | POA: Insufficient documentation

## 2019-02-17 DIAGNOSIS — Z3A09 9 weeks gestation of pregnancy: Secondary | ICD-10-CM | POA: Insufficient documentation

## 2019-02-17 DIAGNOSIS — B379 Candidiasis, unspecified: Secondary | ICD-10-CM | POA: Diagnosis not present

## 2019-02-17 DIAGNOSIS — O26891 Other specified pregnancy related conditions, first trimester: Secondary | ICD-10-CM | POA: Diagnosis not present

## 2019-02-17 DIAGNOSIS — O23591 Infection of other part of genital tract in pregnancy, first trimester: Secondary | ICD-10-CM

## 2019-02-17 DIAGNOSIS — Z87891 Personal history of nicotine dependence: Secondary | ICD-10-CM | POA: Insufficient documentation

## 2019-02-17 DIAGNOSIS — O26899 Other specified pregnancy related conditions, unspecified trimester: Secondary | ICD-10-CM

## 2019-02-17 DIAGNOSIS — J45909 Unspecified asthma, uncomplicated: Secondary | ICD-10-CM | POA: Diagnosis not present

## 2019-02-17 HISTORY — DX: Essential (primary) hypertension: I10

## 2019-02-17 LAB — URINALYSIS, ROUTINE W REFLEX MICROSCOPIC
Bilirubin Urine: NEGATIVE
Glucose, UA: NEGATIVE mg/dL
Hgb urine dipstick: NEGATIVE
Ketones, ur: NEGATIVE mg/dL
Leukocytes,Ua: NEGATIVE
Nitrite: NEGATIVE
Protein, ur: NEGATIVE mg/dL
Specific Gravity, Urine: 1.014 (ref 1.005–1.030)
pH: 6 (ref 5.0–8.0)

## 2019-02-17 LAB — CBC
HCT: 40.9 % (ref 36.0–46.0)
Hemoglobin: 14.4 g/dL (ref 12.0–15.0)
MCH: 28 pg (ref 26.0–34.0)
MCHC: 35.2 g/dL (ref 30.0–36.0)
MCV: 79.4 fL — ABNORMAL LOW (ref 80.0–100.0)
Platelets: 265 10*3/uL (ref 150–400)
RBC: 5.15 MIL/uL — ABNORMAL HIGH (ref 3.87–5.11)
RDW: 12.2 % (ref 11.5–15.5)
WBC: 10.1 10*3/uL (ref 4.0–10.5)
nRBC: 0 % (ref 0.0–0.2)

## 2019-02-17 LAB — WET PREP, GENITAL
Clue Cells Wet Prep HPF POC: NONE SEEN
Sperm: NONE SEEN
Trich, Wet Prep: NONE SEEN

## 2019-02-17 LAB — POCT PREGNANCY, URINE: Preg Test, Ur: POSITIVE — AB

## 2019-02-17 LAB — HCG, QUANTITATIVE, PREGNANCY: hCG, Beta Chain, Quant, S: 100716 m[IU]/mL — ABNORMAL HIGH (ref <5)

## 2019-02-17 MED ORDER — TERCONAZOLE 0.4 % VA CREA: 1.0000 | TOPICAL_CREAM | Freq: Every day | VAGINAL | 0 refills | Status: AC

## 2019-02-17 NOTE — MAU Provider Note (Signed)
History     CSN: [id number]  Arrival date and time: 02/17/19 1641   First Provider Initiated Contact with Patient 02/17/19 2112      Chief Complaint  Patient presents with  . Abdominal Pain   Ms. Kathleen [name] is a 28 y.o. G3P0020 at Unknown who presents to MAU for lower abdominal pain.  Onset: this morning Location: LLQ Duration: <24hrs Character: "pulling," intermittent, shooting Aggravating/Associated: none/none Relieving: none Treatment: water (worked) Severity: 0/10  Pt denies VB, vaginal discharge/odor/itching. Pt denies N/V, abdominal pain, constipation, diarrhea, or urinary problems. Pt denies fever, chills, fatigue, sweating or changes in appetite. Pt denies SOB or chest pain. Pt denies dizziness, HA, light-headedness, weakness.  Problems this pregnancy include: cHTN (no meds). Allergies? NKDA Current medications/supplements? Inhaler, PNVs Prenatal care provider? [facility], next appt 03/07/2019   OB History    Gravida  3   Para      Term      Preterm      AB  2   Living  0     SAB      TAB      Ectopic      Multiple      Live Births              Past Medical History:  Diagnosis Date  . Asthma   . Hypertension     Past Surgical History:  Procedure Laterality Date  . DILATION AND EVACUATION N/A 12/16/2012   Procedure: DILATATION AND EVACUATION;  Surgeon: Serita KyleSheronette A Cousins, MD;  Location: [facility];  Service: Gynecology;  Laterality: N/A;  . WISDOM TOOTH EXTRACTION  2009    Family History  Problem Relation Age of Onset  . Cancer Mother     Social History   Tobacco Use  . Smoking status: Former Games developermoker  . Smokeless tobacco: Never Used  Substance Use Topics  . Alcohol use: No  . Drug use: Yes    Types: Marijuana    Comment: last smoked over a yr ago    Allergies: No Known Allergies  Medications Prior to Admission  Medication Sig Dispense Refill Last Dose  . albuterol (PROVENTIL HFA;VENTOLIN HFA) 108 (90 BASE) MCG/ACT  inhaler Inhale 2 puffs into the lungs every 6 (six) hours as needed. For wheezing and shortness of breath.   02/17/2019 at Unknown time  . cephALEXin (KEFLEX) 500 MG capsule 2 caps po bid x 7 days (Patient not taking: Reported on 12/13/2018) 28 capsule 0   . ketotifen (ZADITOR) 0.025 % ophthalmic solution Place 1 drop into both eyes 2 (two) times daily. 5 mL 0   . levocetirizine (XYZAL) 5 MG tablet Take 1 tablet (5 mg total) by mouth every evening. 30 tablet 0   . montelukast (SINGULAIR) 10 MG tablet Take 10 mg by mouth at bedtime.     . predniSONE (DELTASONE) 20 MG tablet Take 3 tablets (60 mg total) by mouth daily. (Patient not taking: Reported on 12/13/2018) 15 tablet 0     Review of Systems  Constitutional: Negative for chills, diaphoresis, fatigue and fever.  Respiratory: Negative for shortness of breath.   Cardiovascular: Negative for chest pain.  Gastrointestinal: Positive for abdominal pain. Negative for constipation, diarrhea, nausea and vomiting.  Genitourinary: Negative for dysuria, flank pain, frequency, pelvic pain, urgency, vaginal bleeding and vaginal discharge.  Neurological: Negative for dizziness, weakness, light-headedness and headaches.   Physical Exam   Blood pressure (!) 143/85, pulse 100, temperature 98.2 F (36.8 C), temperature source Oral, resp. rate  18, height 5' 7.5" (1.715 m), weight 96.4 kg, last menstrual period 12/09/2018, SpO2 100 %, unknown if currently breastfeeding.  Patient Vitals for the past 24 hrs:  BP Temp Temp src Pulse Resp SpO2 Height Weight  02/17/19 2002 - - - - - 100 % - -  02/17/19 1830 (!) 143/85 98.2 F (36.8 C) Oral 100 18 100 % 5' 7.5" (1.715 m) 96.4 kg   Physical Exam  Constitutional: She is oriented to person, place, and time. She appears well-developed and well-nourished. No distress.  HENT:  Head: Normocephalic and atraumatic.  Respiratory: Effort normal.  GI: Soft. She exhibits no distension and no mass. There is no abdominal  tenderness. There is no rebound and no guarding.  Genitourinary: There is no rash, tenderness or lesion on the right labia. There is no rash, tenderness or lesion on the left labia. Uterus is not enlarged and not tender. Cervix exhibits no motion tenderness, no discharge and no friability. Right adnexum displays no mass, no tenderness and no fullness. Left adnexum displays no mass, no tenderness and no fullness.    Vaginal discharge (scant, thick, white) present.     No vaginal tenderness or bleeding.  No tenderness or bleeding in the vagina.  Neurological: She is alert and oriented to person, place, and time.  Skin: Skin is warm and dry. She is not diaphoretic.  Psychiatric: She has a normal mood and affect. Her behavior is normal. Judgment and thought content normal.   Results for orders placed or performed during the hospital encounter of 02/17/19 (from the past 24 hour(s))  Urinalysis, Routine w reflex microscopic     Status: None   Collection Time: 02/17/19  8:38 PM  Result Value Ref Range   Color, Urine YELLOW YELLOW   APPearance CLEAR CLEAR   Specific Gravity, Urine 1.014 1.005 - 1.030   pH 6.0 5.0 - 8.0   Glucose, UA NEGATIVE NEGATIVE mg/dL   Hgb urine dipstick NEGATIVE NEGATIVE   Bilirubin Urine NEGATIVE NEGATIVE   Ketones, ur NEGATIVE NEGATIVE mg/dL   Protein, ur NEGATIVE NEGATIVE mg/dL   Nitrite NEGATIVE NEGATIVE   Leukocytes,Ua NEGATIVE NEGATIVE  Pregnancy, urine POC     Status: Abnormal   Collection Time: 02/17/19  8:41 PM  Result Value Ref Range   Preg Test, Ur POSITIVE (A) NEGATIVE  CBC     Status: Abnormal   Collection Time: 02/17/19  9:16 PM  Result Value Ref Range   WBC 10.1 4.0 - 10.5 K/uL   RBC 5.15 (H) 3.87 - 5.11 MIL/uL   Hemoglobin 14.4 12.0 - 15.0 g/dL   HCT 10.2 72.5 - 36.6 %   MCV 79.4 (L) 80.0 - 100.0 fL   MCH 28.0 26.0 - 34.0 pg   MCHC 35.2 30.0 - 36.0 g/dL   RDW 44.0 34.7 - 42.5 %   Platelets 265 150 - 400 K/uL   nRBC 0.0 0.0 - 0.2 %  hCG,  quantitative, pregnancy     Status: Abnormal   Collection Time: 02/17/19  9:16 PM  Result Value Ref Range   hCG, Beta Chain, Quant, S 100,716 (H) <5 mIU/mL  Wet prep, genital     Status: Abnormal   Collection Time: 02/17/19  9:36 PM   Specimen: Vaginal  Result Value Ref Range   Yeast Wet Prep HPF POC PRESENT (A) NONE SEEN   Trich, Wet Prep NONE SEEN NONE SEEN   Clue Cells Wet Prep HPF POC NONE SEEN NONE SEEN   WBC, Wet  Prep HPF POC MODERATE (A) NONE SEEN   Sperm NONE SEEN    US Ob Comp Less 14 Wks  Result Date: 02/17/2019 CLINICAL DATA:  Left lower quadrant pain EXAM: OBSTETRIC <14 WK ULTRASOUND TECHNIQUE: Transabdominal ultrasound was performed for evaluation of the gestation as well as the maternal uterus and adnexal regions. COMPARISON:  None. FINDINGS: Intrauterine gestational sac: Single Yolk sac:  Visualized Embryo:  Visualized Cardiac Activity: Visualized Heart Rate: 169 bpm MSD:    mm    w     d CRL:   26.5 mm   9 w 3 d                  [place] EDC: 09/19/2019 Subchorionic hemorrhage:  None visualized. Maternal uterus/adnexae: No adnexal mass or free fluid IMPRESSION: Nine week 3 day intrauterine pregnancy. Fetal heart rate 169 beats per minute. No acute maternal findings. Electronically Signed   By: Rolm Baptise M.D.   On: 02/17/2019 22:00    MAU Course  Procedures  MDM -r/o ectopic -UA: WNL -CBC: WNL -[place]: single IUP, FHR 169, [redacted]w[redacted]d, no adnexal mass or free fluid. -hCG: 100,716 -ABO: O Positive -WetPrep: +yeast, mod WBCs, otherwise WNL -GC/CT collected -pt discharged to home in stable condition  Orders Placed This Encounter  Procedures  . Wet prep, genital    Standing Status:   Standing    Number of Occurrences:   1  . US OB Comp Less 14 Wks    Standing Status:   Standing    Number of Occurrences:   1    Order Specific Question:   Symptom/Reason for Exam    Answer:   Abdominal pain in pregnancy [322025]  . Urinalysis, Routine w reflex microscopic    Standing Status:    Standing    Number of Occurrences:   1  . CBC    Standing Status:   Standing    Number of Occurrences:   1  . hCG, quantitative, pregnancy    Standing Status:   Standing    Number of Occurrences:   1  . Pregnancy, urine POC    Standing Status:   Standing    Number of Occurrences:   1  . Discharge patient    Order Specific Question:   Discharge disposition    Answer:   01-Home or Self Care [1]    Order Specific Question:   Discharge patient date    Answer:   02/17/2019   Meds ordered this encounter  Medications  . terconazole (TERAZOL 7) 0.4 % vaginal cream    Sig: Place 1 applicator vaginally at bedtime for 7 days.    Dispense:  45 g    Refill:  0    Order Specific Question:   Supervising Provider    Answer:   Donnamae Jude [[id number]]   Assessment and Plan   1. Abdominal pain in pregnancy   2. Yeast infection   3. [redacted] weeks gestation of pregnancy    Allergies as of 02/17/2019   No Known Allergies     Medication List    TAKE these medications   albuterol 108 (90 Base) MCG/ACT inhaler Commonly known as: VENTOLIN HFA Inhale 2 puffs into the lungs every 6 (six) hours as needed. For wheezing and shortness of breath.   cephALEXin 500 MG capsule Commonly known as: Keflex 2 caps po bid x 7 days   ketotifen 0.025 % ophthalmic solution Commonly known as: ZADITOR Place 1 drop into both eyes  2 (two) times daily.   levocetirizine 5 MG tablet Commonly known as: Xyzal Take 1 tablet (5 mg total) by mouth every evening.   montelukast 10 MG tablet Commonly known as: SINGULAIR Take 10 mg by mouth at bedtime.   predniSONE 20 MG tablet Commonly known as: DELTASONE Take 3 tablets (60 mg total) by mouth daily.   terconazole 0.4 % vaginal cream Commonly known as: TERAZOL 7 Place 1 applicator vaginally at bedtime for 7 days.      -will call with culture results, if positive -RX terconazole -discussed normal pregnancy cramping vs. s/sx of serious concern -safe meds in pregnancy  list given -strict bleeding/pain/N/V/return MAU precautions given -pt discharged to home in stable condition  Joni Reiningicole E Jun Osment 02/17/2019, 11:22 PM

## 2019-02-17 NOTE — MAU Note (Signed)
Unable to void.  Unable to hear FH with doppler.  Pt does not have verification letter from HD.  To lobby with cup.instructed to let us know when she can void.

## 2019-02-17 NOTE — Discharge Instructions (Signed)
Abdominal Pain During Pregnancy ° °Abdominal pain is common during pregnancy, and has many possible causes. Some causes are more serious than others, and sometimes the cause is not known. Abdominal pain can be a sign that labor is starting. It can also be caused by normal growth and stretching of muscles and ligaments during pregnancy. Always tell your health care provider if you have any abdominal pain. °Follow these instructions at home: °· Do not have sex or put anything in your vagina until your pain goes away completely. °· Get plenty of rest until your pain improves. °· Drink enough fluid to keep your urine pale yellow. °· Take over-the-counter and prescription medicines only as told by your health care provider. °· Keep all follow-up visits as told by your health care provider. This is important. °Contact a health care provider if: °· Your pain continues or gets worse after resting. °· You have lower abdominal pain that: °? Comes and goes at regular intervals. °? Spreads to your back. °? Is similar to menstrual cramps. °· You have pain or burning when you urinate. °Get help right away if: °· You have a fever or chills. °· You have vaginal bleeding. °· You are leaking fluid from your vagina. °· You are passing tissue from your vagina. °· You have vomiting or diarrhea that lasts for more than 24 hours. °· Your baby is moving less than usual. °· You feel very weak or faint. °· You have shortness of breath. °· You develop severe pain in your upper abdomen. °Summary °· Abdominal pain is common during pregnancy, and has many possible causes. °· If you experience abdominal pain during pregnancy, tell your health care provider right away. °· Follow your health care provider's home care instructions and keep all follow-up visits as directed. °This information is not intended to replace advice given to you by your health care provider. Make sure you discuss any questions you have with your health care  provider. °Document Released: 06/09/2005 Document Revised: 09/27/2018 Document Reviewed: 09/11/2016 °Elsevier Patient Education © 2020 [employer] Inc[employer] ° °                 Safe Medications in Pregnancy  ° ° °Acne: °Benzoyl Peroxide °Salicylic Acid ° °Backache/Headache: °Tylenol: 2 regular strength every 4 hours OR °             2 Extra strength every 6 hours ° °Colds/Coughs/Allergies: °Benadryl (alcohol free) 25 mg every 6 hours as needed °Breath right strips °Claritin °Cepacol throat lozenges °Chloraseptic throat spray °Cold-Eeze- up to three times per day °Cough drops, alcohol free °Flonase (by prescription only) °Guaifenesin °Mucinex °Robitussin DM (plain only, alcohol free) °Saline nasal spray/drops °Sudafed (pseudoephedrine) & Actifed ** use only after [redacted] weeks gestation and if you do not have high blood pressure °Tylenol °Vicks Vaporub °Zinc lozenges °Zyrtec  ° °Constipation: °Colace °Ducolax suppositories °Fleet enema °Glycerin suppositories °Metamucil °Milk of magnesia °Miralax °Senokot °Smooth move tea ° °Diarrhea: °Kaopectate °Imodium A-D ° °*NO pepto Bismol ° °Hemorrhoids: °Anusol °Anusol HC °Preparation H °Tucks ° °Indigestion: °Tums °Maalox °Mylanta °Zantac  °Pepcid ° °Insomnia: °Benadryl (alcohol free) 25mg every 6 hours as needed °Tylenol PM °Unisom, no Gelcaps ° °Leg Cramps: °Tums °MagGel ° °Nausea/Vomiting:  °Bonine °Dramamine °Emetrol °Ginger extract °Sea bands °Meclizine  °Nausea medication to take during pregnancy:  °Unisom (doxylamine succinate 25 mg tablets) Take one tablet daily at bedtime. If symptoms are not adequately controlled, the dose can be increased to a maximum recommended dose of two   tablets daily (1/2 tablet in the morning, 1/2 tablet mid-afternoon and one at bedtime). Vitamin B6 100mg  tablets. Take one tablet twice a day (up to 200 mg per day).  Skin Rashes: Aveeno products Benadryl cream or 25mg  every 6 hours as needed Calamine Lotion 1% cortisone cream  Yeast  infection: Gyne-lotrimin 7 Monistat 7   **If taking multiple medications, please check labels to avoid duplicating the same active ingredients **take medication as directed on the label ** Do not exceed 4000 mg of tylenol in 24 hours **Do not take medications that contain aspirin or ibuprofen

## 2019-02-17 NOTE — MAU Note (Signed)
Coming here because she is 10wks preg.  Is having some sharp pains in LLQ.  Started this morning when she woke up.  Has been uncomfortable at night, was afraid she had maybe slept wrong.  No bleeding. preg confirmed at [facility]

## 2019-02-17 NOTE — Progress Notes (Signed)
Nicole Nugent NP in earlier to discuss test results and d/c plan. Written and verbal d/c instructions given and understanding voiced 

## 2019-02-18 LAB — GC/CHLAMYDIA PROBE AMP (~~LOC~~) NOT AT ARMC
Chlamydia: NEGATIVE
Neisseria Gonorrhea: NEGATIVE

## 2019-02-23 ENCOUNTER — Encounter: Payer: Self-pay | Admitting: *Deleted

## 2019-03-07 ENCOUNTER — Ambulatory Visit (HOSPITAL_COMMUNITY): Payer: Self-pay

## 2019-03-07 ENCOUNTER — Encounter: Payer: Self-pay | Admitting: Obstetrics and Gynecology

## 2019-03-07 ENCOUNTER — Ambulatory Visit (INDEPENDENT_AMBULATORY_CARE_PROVIDER_SITE_OTHER): Payer: Medicaid Other | Admitting: Obstetrics and Gynecology

## 2019-03-07 ENCOUNTER — Other Ambulatory Visit: Payer: Self-pay

## 2019-03-07 VITALS — BP 171/80 | HR 90 | Temp 98.5°F | Wt 216.6 lb

## 2019-03-07 DIAGNOSIS — Z3A12 12 weeks gestation of pregnancy: Secondary | ICD-10-CM

## 2019-03-07 DIAGNOSIS — O99511 Diseases of the respiratory system complicating pregnancy, first trimester: Secondary | ICD-10-CM

## 2019-03-07 DIAGNOSIS — J45909 Unspecified asthma, uncomplicated: Secondary | ICD-10-CM | POA: Insufficient documentation

## 2019-03-07 DIAGNOSIS — O10911 Unspecified pre-existing hypertension complicating pregnancy, first trimester: Secondary | ICD-10-CM

## 2019-03-07 DIAGNOSIS — O0990 Supervision of high risk pregnancy, unspecified, unspecified trimester: Secondary | ICD-10-CM | POA: Insufficient documentation

## 2019-03-07 DIAGNOSIS — O10919 Unspecified pre-existing hypertension complicating pregnancy, unspecified trimester: Secondary | ICD-10-CM | POA: Insufficient documentation

## 2019-03-07 DIAGNOSIS — O0991 Supervision of high risk pregnancy, unspecified, first trimester: Secondary | ICD-10-CM

## 2019-03-07 MED ORDER — LABETALOL HCL 200 MG PO TABS: 200.0000 mg | ORAL_TABLET | Freq: Two times a day (BID) | ORAL | 6 refills | Status: DC

## 2019-03-07 MED ORDER — ALBUTEROL SULFATE HFA 108 (90 BASE) MCG/ACT IN AERS: 1.0000 | INHALATION_SPRAY | Freq: Four times a day (QID) | RESPIRATORY_TRACT | 2 refills | Status: DC | PRN

## 2019-03-07 MED ORDER — ASPIRIN EC 81 MG PO TBEC: 81.0000 mg | DELAYED_RELEASE_TABLET | Freq: Every day | ORAL | 2 refills | Status: DC

## 2019-03-07 MED ORDER — BLOOD PRESSURE MONITORING DEVI: 1.0000 | 0 refills | Status: DC

## 2019-03-07 NOTE — Patient Instructions (Signed)
First Trimester of Pregnancy The first trimester of pregnancy is from week 1 until the end of week 13 (months 1 through 3). A week after a sperm fertilizes an egg, the egg will implant on the wall of the uterus. This embryo will begin to develop into a baby. Genes from you and your partner will form the baby. The female genes will determine whether the baby will be a boy or a girl. At 6-8 weeks, the eyes and face will be formed, and the heartbeat can be seen on ultrasound. At the end of 12 weeks, all the baby's organs will be formed. Now that you are pregnant, you will want to do everything you can to have a healthy baby. Two of the most important things are to get good prenatal care and to follow your health care provider's instructions. Prenatal care is all the medical care you receive before the baby's birth. This care will help prevent, find, and treat any problems during the pregnancy and childbirth. Body changes during your first trimester Your body goes through many changes during pregnancy. The changes vary from woman to woman.  You may gain or lose a couple of pounds at first.  You may feel sick to your stomach (nauseous) and you may throw up (vomit). If the vomiting is uncontrollable, call your health care provider.  You may tire easily.  You may develop headaches that can be relieved by medicines. All medicines should be approved by your health care provider.  You may urinate more often. Painful urination may mean you have a bladder infection.  You may develop heartburn as a result of your pregnancy.  You may develop constipation because certain hormones are causing the muscles that push stool through your intestines to slow down.  You may develop hemorrhoids or swollen veins (varicose veins).  Your breasts may begin to grow larger and become tender. Your nipples may stick out more, and the tissue that surrounds them (areola) may become darker.  Your gums may bleed and may be  sensitive to brushing and flossing.  Dark spots or blotches (chloasma, mask of pregnancy) may develop on your face. This will likely fade after the baby is born.  Your menstrual periods will stop.  You may have a loss of appetite.  You may develop cravings for certain kinds of food.  You may have changes in your emotions from day to day, such as being excited to be pregnant or being concerned that something may go wrong with the pregnancy and baby.  You may have more vivid and strange dreams.  You may have changes in your hair. These can include thickening of your hair, rapid growth, and changes in texture. Some women also have hair loss during or after pregnancy, or hair that feels dry or thin. Your hair will most likely return to normal after your baby is born. What to expect at prenatal visits During a routine prenatal visit:  You will be weighed to make sure you and the baby are growing normally.  Your blood pressure will be taken.  Your abdomen will be measured to track your baby's growth.  The fetal heartbeat will be listened to between weeks 10 and 14 of your pregnancy.  Test results from any previous visits will be discussed. Your health care provider may ask you:  How you are feeling.  If you are feeling the baby move.  If you have had any abnormal symptoms, such as leaking fluid, bleeding, severe headaches, or abdominal   cramping.  If you are using any tobacco products, including cigarettes, chewing tobacco, and electronic cigarettes.  If you have any questions. Other tests that may be performed during your first trimester include:  Blood tests to find your blood type and to check for the presence of any previous infections. The tests will also be used to check for low iron levels (anemia) and protein on red blood cells (Rh antibodies). Depending on your risk factors, or if you previously had diabetes during pregnancy, you may have tests to check for high blood sugar  that affects pregnant women (gestational diabetes).  Urine tests to check for infections, diabetes, or protein in the urine.  An ultrasound to confirm the proper growth and development of the baby.  Fetal screens for spinal cord problems (spina bifida) and Down syndrome.  HIV (human immunodeficiency virus) testing. Routine prenatal testing includes screening for HIV, unless you choose not to have this test.  You may need other tests to make sure you and the baby are doing well. Follow these instructions at home: Medicines  Follow your health care provider's instructions regarding medicine use. Specific medicines may be either safe or unsafe to take during pregnancy.  Take a prenatal vitamin that contains at least 600 micrograms (mcg) of folic acid.  If you develop constipation, try taking a stool softener if your health care provider approves. Eating and drinking   Eat a balanced diet that includes fresh fruits and vegetables, whole grains, good sources of protein such as meat, eggs, or tofu, and low-fat dairy. Your health care provider will help you determine the amount of weight gain that is right for you.  Avoid raw meat and uncooked cheese. These carry germs that can cause birth defects in the baby.  Eating four or five small meals rather than three large meals a day may help relieve nausea and vomiting. If you start to feel nauseous, eating a few soda crackers can be helpful. Drinking liquids between meals, instead of during meals, also seems to help ease nausea and vomiting.  Limit foods that are high in fat and processed sugars, such as fried and sweet foods.  To prevent constipation: ? Eat foods that are high in fiber, such as fresh fruits and vegetables, whole grains, and beans. ? Drink enough fluid to keep your urine clear or pale yellow. Activity  Exercise only as directed by your health care provider. Most women can continue their usual exercise routine during  pregnancy. Try to exercise for 30 minutes at least 5 days a week. Exercising will help you: ? Control your weight. ? Stay in shape. ? Be prepared for labor and delivery.  Experiencing pain or cramping in the lower abdomen or lower back is a good sign that you should stop exercising. Check with your health care provider before continuing with normal exercises.  Try to avoid standing for long periods of time. Move your legs often if you must stand in one place for a long time.  Avoid heavy lifting.  Wear low-heeled shoes and practice good posture.  You may continue to have sex unless your health care provider tells you not to. Relieving pain and discomfort  Wear a good support bra to relieve breast tenderness.  Take warm sitz baths to soothe any pain or discomfort caused by hemorrhoids. Use hemorrhoid cream if your health care provider approves.  Rest with your legs elevated if you have leg cramps or low back pain.  If you develop varicose veins in   your legs, wear support hose. Elevate your feet for 15 minutes, 3-4 times a day. Limit salt in your diet. Prenatal care  Schedule your prenatal visits by the twelfth week of pregnancy. They are usually scheduled monthly at first, then more often in the last 2 months before delivery.  Write down your questions. Take them to your prenatal visits.  Keep all your prenatal visits as told by your health care provider. This is important. Safety  Wear your seat belt at all times when driving.  Make a list of emergency phone numbers, including numbers for family, friends, the hospital, and police and fire departments. General instructions  Ask your health care provider for a referral to a local prenatal education class. Begin classes no later than the beginning of month 6 of your pregnancy.  Ask for help if you have counseling or nutritional needs during pregnancy. Your health care provider can offer advice or refer you to specialists for help  with various needs.  Do not use hot tubs, steam rooms, or saunas.  Do not douche or use tampons or scented sanitary pads.  Do not cross your legs for long periods of time.  Avoid cat litter boxes and soil used by cats. These carry germs that can cause birth defects in the baby and possibly loss of the fetus by miscarriage or stillbirth.  Avoid all smoking, herbs, alcohol, and medicines not prescribed by your health care provider. Chemicals in these products affect the formation and growth of the baby.  Do not use any products that contain nicotine or tobacco, such as cigarettes and e-cigarettes. If you need help quitting, ask your health care provider. You may receive counseling support and other resources to help you quit.  Schedule a dentist appointment. At home, brush your teeth with a soft toothbrush and be gentle when you floss. Contact a health care provider if:  You have dizziness.  You have mild pelvic cramps, pelvic pressure, or nagging pain in the abdominal area.  You have persistent nausea, vomiting, or diarrhea.  You have a bad smelling vaginal discharge.  You have pain when you urinate.  You notice increased swelling in your face, hands, legs, or ankles.  You are exposed to fifth disease or chickenpox.  You are exposed to German measles (rubella) and have never had it. Get help right away if:  You have a fever.  You are leaking fluid from your vagina.  You have spotting or bleeding from your vagina.  You have severe abdominal cramping or pain.  You have rapid weight gain or loss.  You vomit blood or material that looks like coffee grounds.  You develop a severe headache.  You have shortness of breath.  You have any kind of trauma, such as from a fall or a car accident. Summary  The first trimester of pregnancy is from week 1 until the end of week 13 (months 1 through 3).  Your body goes through many changes during pregnancy. The changes vary from  woman to woman.  You will have routine prenatal visits. During those visits, your health care provider will examine you, discuss any test results you may have, and talk with you about how you are feeling. This information is not intended to replace advice given to you by your health care provider. Make sure you discuss any questions you have with your health care provider. Document Released: 06/03/2001 Document Revised: 05/22/2017 Document Reviewed: 05/21/2016 Elsevier Patient Education  2020 [employer].  

## 2019-03-07 NOTE — Progress Notes (Signed)
Subjective:  Kathleen [name] is a 28 y.o. G3P0020 at [redacted]w[redacted]d being seen today for ongoing prenatal care. Transfer from [facility], [place] in April, no meds. H/O Asthma, followed by PCP. EDD by first trimester U/S.  She is currently monitored for the following issues for this high-risk pregnancy and has Supervision of high risk pregnancy, antepartum; Chronic hypertension affecting pregnancy; and Asthma affecting pregnancy in first trimester on their problem list.  Patient reports no complaints.  Contractions: Not present. Vag. Bleeding: None.  Movement: Absent. Denies leaking of fluid.   The following portions of the patient's history were reviewed and updated as appropriate: allergies, current medications, past family history, past medical history, past social history, past surgical history and problem list. Problem list updated.  Objective:   Vitals:   03/07/19 1352 03/07/19 1401  BP: (!) 155/78 (!) 171/80  Pulse: 99 90  Temp: 98.5 F (36.9 C)   Weight: 216 lb 9.6 oz (98.2 kg)     Fetal Status: Fetal Heart Rate (bpm): 166   Movement: Absent     General:  Alert, oriented and cooperative. Patient is in no acute distress.  Skin: Skin is warm and dry. No rash noted.   Cardiovascular: Normal heart rate noted  Respiratory: Normal respiratory effort, no problems with respiration noted  Abdomen: Soft, gravid, appropriate for gestational age. Pain/Pressure: Absent     Pelvic:  Cervical exam deferred        Extremities: Normal range of motion.  Edema: Trace  Mental Status: Normal mood and affect. Normal behavior. Normal judgment and thought content.   Urinalysis:      Assessment and Plan:  Pregnancy: G3P0020 at [redacted]w[redacted]d  1. Supervision of high risk pregnancy, antepartum Stable Prenatal labs and care reviewed with pt Genetic testing reviewed, pt desires Declined flu vaccine - [facility] AMB BABYSCRIPTS SCHEDULE OPTIMIZATION - Genetic Screening - TSH - Protein / creatinine ratio, urine - [place] MFM OB  DETAIL +14 WK; Future - Blood Pressure Monitoring DEVI; 1 Device by Does not apply route once a week. ICD CODE; O09.90  Dispense: 1 Device; Refill: 0  2. Chronic hypertension affecting pregnancy CHTN and pregnancy reviewed with pt Will start BASA and Labetalol. Baby Rx and BP cuff ordered and reviewed with pt. - labetalol (NORMODYNE) 200 MG tablet; Take 1 tablet (200 mg total) by mouth 2 (two) times daily.  Dispense: 60 tablet; Refill: 6 - aspirin EC 81 MG tablet; Take 1 tablet (81 mg total) by mouth daily. Take after 12 weeks for prevention of preeclampsia later in pregnancy  Dispense: 300 tablet; Refill: 2  3. Asthma affecting pregnancy in first trimester Stable - albuterol (VENTOLIN HFA) 108 (90 Base) MCG/ACT inhaler; Inhale 1-2 puffs into the lungs every 6 (six) hours as needed for wheezing. For wheezing and shortness of breath.  Dispense: 1 g; Refill: 2  Preterm labor symptoms and general obstetric precautions including but not limited to vaginal bleeding, contractions, leaking of fluid and fetal movement were reviewed in detail with the patient. Please refer to After Visit Summary for other counseling recommendations.  Return in about 2 weeks (around 03/21/2019) for OB visit, face to face for BP check.   Chancy Milroy, MD

## 2019-03-08 ENCOUNTER — Encounter: Payer: Self-pay | Admitting: *Deleted

## 2019-03-09 ENCOUNTER — Ambulatory Visit (HOSPITAL_COMMUNITY): Payer: Medicaid Other

## 2019-03-09 ENCOUNTER — Ambulatory Visit (HOSPITAL_COMMUNITY): Payer: Self-pay

## 2019-03-09 ENCOUNTER — Other Ambulatory Visit: Payer: Self-pay | Admitting: Lactation Services

## 2019-03-09 ENCOUNTER — Encounter (HOSPITAL_COMMUNITY): Payer: Self-pay

## 2019-03-09 DIAGNOSIS — O0990 Supervision of high risk pregnancy, unspecified, unspecified trimester: Secondary | ICD-10-CM

## 2019-03-09 LAB — TSH: TSH: <0.005 u[IU]/mL — ABNORMAL LOW (ref 0.450–4.500)

## 2019-03-09 NOTE — Addendum Note (Signed)
Addended by: Donn Pierini on: 03/09/2019 01:51 PM   Modules accepted: Orders

## 2019-03-10 LAB — PROTEIN / CREATININE RATIO, URINE
Creatinine, Urine: 123 mg/dL
Protein, Ur: 27.1 mg/dL
Protein/Creat Ratio: 220 mg/g creat — ABNORMAL HIGH (ref 0–200)

## 2019-03-14 LAB — SPECIMEN STATUS REPORT

## 2019-03-14 LAB — T4, FREE: Free T4: 2.23 ng/dL — ABNORMAL HIGH (ref 0.82–1.77)

## 2019-03-21 ENCOUNTER — Ambulatory Visit (INDEPENDENT_AMBULATORY_CARE_PROVIDER_SITE_OTHER): Payer: Medicaid Other | Admitting: Obstetrics and Gynecology

## 2019-03-21 ENCOUNTER — Other Ambulatory Visit: Payer: Self-pay

## 2019-03-21 VITALS — BP 153/77 | HR 101 | Wt 221.0 lb

## 2019-03-21 DIAGNOSIS — J45909 Unspecified asthma, uncomplicated: Secondary | ICD-10-CM

## 2019-03-21 DIAGNOSIS — O99512 Diseases of the respiratory system complicating pregnancy, second trimester: Secondary | ICD-10-CM

## 2019-03-21 DIAGNOSIS — Z3A14 14 weeks gestation of pregnancy: Secondary | ICD-10-CM

## 2019-03-21 DIAGNOSIS — O10912 Unspecified pre-existing hypertension complicating pregnancy, second trimester: Secondary | ICD-10-CM

## 2019-03-21 DIAGNOSIS — D573 Sickle-cell trait: Secondary | ICD-10-CM

## 2019-03-21 DIAGNOSIS — O99511 Diseases of the respiratory system complicating pregnancy, first trimester: Secondary | ICD-10-CM

## 2019-03-21 DIAGNOSIS — E079 Disorder of thyroid, unspecified: Secondary | ICD-10-CM

## 2019-03-21 DIAGNOSIS — O0992 Supervision of high risk pregnancy, unspecified, second trimester: Secondary | ICD-10-CM

## 2019-03-21 DIAGNOSIS — O99282 Endocrine, nutritional and metabolic diseases complicating pregnancy, second trimester: Secondary | ICD-10-CM

## 2019-03-21 DIAGNOSIS — O10919 Unspecified pre-existing hypertension complicating pregnancy, unspecified trimester: Secondary | ICD-10-CM

## 2019-03-21 DIAGNOSIS — O0990 Supervision of high risk pregnancy, unspecified, unspecified trimester: Secondary | ICD-10-CM

## 2019-03-21 MED ORDER — LABETALOL HCL 100 MG PO TABS: 300.0000 mg | ORAL_TABLET | Freq: Two times a day (BID) | ORAL | 2 refills | Status: DC

## 2019-03-21 NOTE — Progress Notes (Signed)
TDAP and Flu @28weeks 

## 2019-03-21 NOTE — Progress Notes (Signed)
Prenatal Visit Note Date: 03/21/2019 Clinic: [facility]  Subjective:  Kathleen [name] is a 28 y.o. G3P0020 at [redacted]w[redacted]d being seen today for ongoing prenatal care.  She is currently monitored for the following issues for this high-risk pregnancy and has Supervision of high risk pregnancy, antepartum; Chronic hypertension affecting pregnancy; Asthma affecting pregnancy in first trimester; and Sickle cell trait ([place]) on their problem list.  Patient reports no complaints.   Contractions: Not present. Vag. Bleeding: None.  Movement: Absent. Denies leaking of fluid.   The following portions of the patient's history were reviewed and updated as appropriate: allergies, current medications, past family history, past medical history, past social history, past surgical history and problem list. Problem list updated.  Objective:   Vitals:   03/21/19 1530  BP: (!) 153/77  Pulse: (!) 101  Weight: 221 lb (100.2 kg)    Fetal Status: Fetal Heart Rate (bpm): 159   Movement: Absent     General:  Alert, oriented and cooperative. Patient is in no acute distress.  Skin: Skin is warm and dry. No rash noted.   Cardiovascular: Normal heart rate noted  Respiratory: Normal respiratory effort, no problems with respiration noted  Abdomen: Soft, gravid, appropriate for gestational age. Pain/Pressure: Absent     Pelvic:  Cervical exam deferred        Extremities: Normal range of motion.  Edema: Trace  Mental Status: Normal mood and affect. Normal behavior. Normal judgment and thought content.   Urinalysis:      Assessment and Plan:  Pregnancy: G3P0020 at [redacted]w[redacted]d  1. Supervision of high risk pregnancy, antepartum Routine care.  Already set up for anatomy u/s Offer afp nv - VITAMIN D 25 Hydroxy (Vit-D Deficiency, Fractures)  2. Sickle cell trait (HCC) D/w her. ucx qtrimester  3. Chronic hypertension affecting pregnancy Recommend increasing to 300 bid from 200 bid. Pt confirms on low  dose asa - labetalol (NORMODYNE) 100 MG tablet; Take 3 tablets (300 mg total) by mouth 2 (two) times daily.  Dispense: 60 tablet; Refill: 2  4. Asthma affecting pregnancy in first trimester No issues  5. Disorder of thyroid, antepartum ft4 elevated but normal for pregnancy. Will get ft3 today Pt states she's been told she had high thyroid in the past, outside of pregnancy, and didn't start meds b/c she didn't want to - T3, free  Preterm labor symptoms and general obstetric precautions including but not limited to vaginal bleeding, contractions, leaking of fluid and fetal movement were reviewed in detail with the patient. Please refer to After Visit Summary for other counseling recommendations.  No follow-ups on file.   Aletha Halim, MD

## 2019-03-21 NOTE — Progress Notes (Signed)
Patient advised of panorama and horizon results

## 2019-03-22 LAB — VITAMIN D 25 HYDROXY (VIT D DEFICIENCY, FRACTURES): Vit D, 25-Hydroxy: 31.9 ng/mL (ref 30.0–100.0)

## 2019-03-22 LAB — T3, FREE: T3, Free: 7.2 pg/mL — ABNORMAL HIGH (ref 2.0–4.4)

## 2019-03-28 ENCOUNTER — Encounter: Payer: Self-pay | Admitting: *Deleted

## 2019-03-29 ENCOUNTER — Telehealth: Payer: Self-pay | Admitting: Obstetrics and Gynecology

## 2019-03-29 NOTE — Telephone Encounter (Signed)
OB Telephone Note Patient called at [phone] re: thyroid labs, and said I'll talk to MFM re: her lab results  Durene Romans MD Attending [facility] [employer] (Faculty Practice) 03/29/2019 Time: 8022

## 2019-04-04 ENCOUNTER — Encounter: Payer: Self-pay | Admitting: Obstetrics and Gynecology

## 2019-04-04 ENCOUNTER — Ambulatory Visit (INDEPENDENT_AMBULATORY_CARE_PROVIDER_SITE_OTHER): Payer: Medicaid Other | Admitting: Family Medicine

## 2019-04-04 ENCOUNTER — Other Ambulatory Visit: Payer: Self-pay

## 2019-04-04 VITALS — BP 140/81 | HR 106 | Wt 222.9 lb

## 2019-04-04 DIAGNOSIS — E0590 Thyrotoxicosis, unspecified without thyrotoxic crisis or storm: Secondary | ICD-10-CM | POA: Insufficient documentation

## 2019-04-04 DIAGNOSIS — E079 Disorder of thyroid, unspecified: Secondary | ICD-10-CM

## 2019-04-04 DIAGNOSIS — O10912 Unspecified pre-existing hypertension complicating pregnancy, second trimester: Secondary | ICD-10-CM

## 2019-04-04 DIAGNOSIS — O10919 Unspecified pre-existing hypertension complicating pregnancy, unspecified trimester: Secondary | ICD-10-CM

## 2019-04-04 DIAGNOSIS — O99282 Endocrine, nutritional and metabolic diseases complicating pregnancy, second trimester: Secondary | ICD-10-CM

## 2019-04-04 DIAGNOSIS — O0992 Supervision of high risk pregnancy, unspecified, second trimester: Secondary | ICD-10-CM

## 2019-04-04 DIAGNOSIS — D573 Sickle-cell trait: Secondary | ICD-10-CM

## 2019-04-04 DIAGNOSIS — R946 Abnormal results of thyroid function studies: Secondary | ICD-10-CM

## 2019-04-04 DIAGNOSIS — L709 Acne, unspecified: Secondary | ICD-10-CM

## 2019-04-04 DIAGNOSIS — O0990 Supervision of high risk pregnancy, unspecified, unspecified trimester: Secondary | ICD-10-CM

## 2019-04-04 DIAGNOSIS — Z3A16 16 weeks gestation of pregnancy: Secondary | ICD-10-CM

## 2019-04-04 DIAGNOSIS — O99280 Endocrine, nutritional and metabolic diseases complicating pregnancy, unspecified trimester: Secondary | ICD-10-CM

## 2019-04-04 MED ORDER — CLINDAMYCIN PHOSPHATE 1 % EX SOLN: Freq: Two times a day (BID) | CUTANEOUS | 0 refills | Status: DC

## 2019-04-04 NOTE — Progress Notes (Signed)
RE: start meds for hyperthyroidism? Due: 3 days ago  Received: 5 days ago Message Contents  Tama High, MD  Aletha Halim, MD; Ulice Dash, MD; Jaynie Collins, MD        Her TFTs were drawn in the first trimester and can be confused with hyperthyroidism. But, she seems to have a history of hyperthyroidism.  I will redraw labs in the second trimester and ask/look for clinical features of hyperthyroidism. And then refer to endo if necessary.  Ravi   Previous Messages  ----- Message -----  From: Aletha Halim, MD  Sent: 03/29/2019  9:33 AM EDT  To: Tama High, MD, Corenthian Ferol Luz, MD, *  Subject: start meds for hyperthyroidism?          Can y'all let me know what you think? I think she's borderline to start. She has no chest pain, SOB or heart palps.   Thanks!

## 2019-04-04 NOTE — Progress Notes (Signed)
   PRENATAL VISIT NOTE  Subjective:  Kathleen [name] is a 28 y.o. G3P0020 at [redacted]w[redacted]d being seen today for ongoing prenatal care.  She is currently monitored for the following issues for this high-risk pregnancy and has Supervision of high risk pregnancy, antepartum; Chronic hypertension affecting pregnancy; Asthma affecting pregnancy in first trimester; Sickle cell trait ([place]); and Abnormal thyroid function test on their problem list.  Patient reports acne.  Contractions: Not present. Vag. Bleeding: None.  Movement: Present. Denies leaking of fluid.   The following portions of the patient's history were reviewed and updated as appropriate: allergies, current medications, past family history, past medical history, past social history, past surgical history and problem list.   Objective:   Vitals:   04/04/19 1405  BP: 140/81  Pulse: (!) 106  Weight: 222 lb 14.4 oz (101.1 kg)    Fetal Status: Fetal Heart Rate (bpm): 154   Movement: Present     General:  Alert, oriented and cooperative. Patient is in no acute distress.  Skin: Skin is warm and dry. No rash noted.   Cardiovascular: Normal heart rate noted  Respiratory: Normal respiratory effort, no problems with respiration noted  Abdomen: Soft, gravid, appropriate for gestational age.  Pain/Pressure: Present     Pelvic: Cervical exam deferred        Extremities: Normal range of motion.  Edema: Trace  Mental Status: Normal mood and affect. Normal behavior. Normal judgment and thought content.   Assessment and Plan:  Pregnancy: G3P0020 at [redacted]w[redacted]d 1. Supervision of high risk pregnancy, antepartum FHT and FH normal  2. Sickle cell trait ([place])   3. Chronic hypertension affecting pregnancy Cont Labetalol Cont ASA  4. Disorder of thyroid, antepartum MFM recommended recheck thyroid studies (TSH, FT3, FT4) in 4 weeks.  5. Acne, unspecified acne type Clindamycin gel  Preterm labor symptoms and general obstetric precautions including but  not limited to vaginal bleeding, contractions, leaking of fluid and fetal movement were reviewed in detail with the patient. Please refer to After Visit Summary for other counseling recommendations.   No follow-ups on file.  Future Appointments  Date Time Provider [place]  04/25/2019  1:00 PM [facility] [place] 3 [facility]    Jacob J Stinson, DO

## 2019-04-05 ENCOUNTER — Encounter: Payer: Self-pay | Admitting: General Practice

## 2019-04-07 ENCOUNTER — Telehealth (INDEPENDENT_AMBULATORY_CARE_PROVIDER_SITE_OTHER): Payer: Medicaid Other | Admitting: Lactation Services

## 2019-04-07 DIAGNOSIS — O10919 Unspecified pre-existing hypertension complicating pregnancy, unspecified trimester: Secondary | ICD-10-CM

## 2019-04-07 NOTE — Telephone Encounter (Signed)
Pt called with concerns that she threw up her Aspirin, BP Meds and PNV this morning about 30 minutes after she took it. She is rarely nauseated. Pt did not retake her meds. She took her meds with cereal and milk like she usually does. Pt reports she has a history of increased BP just prior to knowing she was pregnant. Pt has been on Labetalol for about 4 weeks and dosage recently increased. Enc pt to take meds with food and to space out PNV from other meds as she felt like it may have contributed to her feeling sick.   She reports she is having nose bleeds a lot and had one today that bled for over 20 minutes. She took toilet paper in her nose. Discussed applying pressure to bridge of nose and not to stick anything up her nose as when removed can dislodge a clot and resume the bleeding. Reviewed leaning forward instead of back to decrease blood going down throat. Discussed nose bleeds can be common for some women during pregnancy. Suggested using a Humidifier in her room at night.   Pt is able to check her BP at home. Pt took her BP while on the phone and it was 130/68 with pulse of 104. She was sitting and was not talking while taking. Pt denies dizziness and blurred vision at this time.   Pt says she is been feeling dizzy almost every day, she is having some blurred vision when dizzy. She noted the dizziness and blurred vision has increased since increasing Labetalol dosage recently. She is not having headaches. She will lay down and go to sleep and feels a lot better when she wakes up.   Discussed with pt to check her BP when she is having the symptoms and that if her BP is greater than 140/90 and is accompanied by blurred vision, dizziness, or headache that we would recommend that she go to the Maternity Assessment Unit at the [facility] and [facility] for evaluation.   Enc pt to call [employer] to get it set up so she can enter her BP in the app. Pt reports she will do so.  She reports she has the email to sign up.   Informed pt she can also send messages to the offices through [place] is she has questions or concerns.   Pt reports all questions have been answered. She will call as needed.

## 2019-04-11 ENCOUNTER — Encounter (HOSPITAL_COMMUNITY): Payer: Self-pay

## 2019-04-11 ENCOUNTER — Other Ambulatory Visit: Payer: Self-pay

## 2019-04-11 ENCOUNTER — Ambulatory Visit (HOSPITAL_COMMUNITY)
Admission: EM | Admit: 2019-04-11 | Discharge: 2019-04-11 | Disposition: A | Discharge status: Home / Self Care | Payer: Medicaid Other | Attending: Internal Medicine | Admitting: Internal Medicine

## 2019-04-11 DIAGNOSIS — E0590 Thyrotoxicosis, unspecified without thyrotoxic crisis or storm: Secondary | ICD-10-CM | POA: Diagnosis present

## 2019-04-11 DIAGNOSIS — Z202 Contact with and (suspected) exposure to infections with a predominantly sexual mode of transmission: Secondary | ICD-10-CM | POA: Insufficient documentation

## 2019-04-11 DIAGNOSIS — O99281 Endocrine, nutritional and metabolic diseases complicating pregnancy, first trimester: Secondary | ICD-10-CM | POA: Diagnosis present

## 2019-04-11 LAB — POCT URINALYSIS DIP (DEVICE)
Bilirubin Urine: NEGATIVE
Glucose, UA: NEGATIVE mg/dL
Hgb urine dipstick: NEGATIVE
Ketones, ur: NEGATIVE mg/dL
Leukocytes,Ua: NEGATIVE
Nitrite: NEGATIVE
Protein, ur: NEGATIVE mg/dL
Specific Gravity, Urine: 1.02 (ref 1.005–1.030)
Urobilinogen, UA: 0.2 mg/dL (ref 0.0–1.0)
pH: 5.5 (ref 5.0–8.0)

## 2019-04-11 LAB — RPR: RPR Ser Ql: NONREACTIVE

## 2019-04-11 LAB — HIV ANTIBODY (ROUTINE TESTING W REFLEX): HIV Screen 4th Generation wRfx: NONREACTIVE

## 2019-04-11 NOTE — ED Provider Notes (Signed)
[place]    CSN: [id number] Arrival date & time: 04/11/19  7106      History   Chief Complaint Chief Complaint  Patient presents with  . Appointment    8:30  . Exposure to STD    HPI Kathleen [name] is a 28 y.o. female with a history of hypertension-controlled, asthma-controlled and hypothyroidism in pregnancy comes to urgent care close to urgent care with with request for STD testing.  Patient is currently pregnant and in the first trimester.  Sexual partner has been having unprotected sex with other women.  Patient denies any vaginal discharge, dysuria, urgency or frequency.  She is worried that she may have an exposed to STD.  Patient denies any dizziness, chest pain or chest pressure.  She has palpitations which is not new.  Her thyroid function indicates hypothyroidism and first trimester.  She is scheduled to see MFM on November 9.  She is currently not on medication for hypothyroidism.  She was seen by her OB/GYN last week.Marland Kitchen   HPI  Past Medical History:  Diagnosis Date  . Asthma   . Hypertension     Patient Active Problem List   Diagnosis Date Noted  . Abnormal thyroid function test 04/04/2019  . Sickle cell trait ([place]) 03/21/2019  . Supervision of high risk pregnancy, antepartum 03/07/2019  . Chronic hypertension affecting pregnancy 03/07/2019  . Asthma affecting pregnancy in first trimester 03/07/2019    Past Surgical History:  Procedure Laterality Date  . DILATION AND EVACUATION N/A 12/16/2012   Procedure: DILATATION AND EVACUATION;  Surgeon: Marvene Staff, MD;  Location: [facility];  Service: Gynecology;  Laterality: N/A;  . WISDOM TOOTH EXTRACTION  2009    OB History    Gravida  3   Para      Term      Preterm      AB  2   Living  0     SAB      TAB      Ectopic      Multiple      Live Births               Home Medications    Prior to Admission medications   Medication Sig Start Date End Date Taking? Authorizing  Provider  albuterol (VENTOLIN HFA) 108 (90 Base) MCG/ACT inhaler Inhale 1-2 puffs into the lungs every 6 (six) hours as needed for wheezing. For wheezing and shortness of breath. 03/07/19   Chancy Milroy, MD  aspirin EC 81 MG tablet Take 1 tablet (81 mg total) by mouth daily. Take after 12 weeks for prevention of preeclampsia later in pregnancy 03/07/19   Chancy Milroy, MD  Blood Pressure Monitoring DEVI 1 Device by Does not apply route once a week. ICD CODE; O09.90 03/07/19   Chancy Milroy, MD  clindamycin (CLEOCIN-T) 1 % external solution Apply topically 2 (two) times daily. 04/04/19   Truett Mainland, DO  labetalol (NORMODYNE) 100 MG tablet Take 3 tablets (300 mg total) by mouth 2 (two) times daily. 03/21/19   Aletha Halim, MD  montelukast (SINGULAIR) 10 MG tablet Take 10 mg by mouth at bedtime.    [provider]  Prenatal Vit-Fe Fumarate-FA (PRENATAL MULTIVITAMIN) TABS tablet Take 1 tablet by mouth daily at 12 noon.    [provider]    Family History Family History  Problem Relation Age of Onset  . Cancer Mother     Social History Social History  Tobacco Use  . Smoking status: Former Games developermoker  . Smokeless tobacco: Never Used  Substance Use Topics  . Alcohol use: No  . Drug use: Yes    Types: Marijuana    Comment: last smoked over a yr ago     Allergies   Patient has no known allergies.   Review of Systems Review of Systems  Constitutional: Positive for activity change. Negative for chills, fatigue, fever and unexpected weight change.  HENT: Negative.   Eyes: Negative.   Respiratory: Negative.   Cardiovascular: Positive for palpitations.  Gastrointestinal: Negative.   Genitourinary: Negative for difficulty urinating, dyspareunia, dysuria, frequency, genital sores, pelvic pain, urgency, vaginal discharge and vaginal pain.  Musculoskeletal: Negative.   Skin: Negative.   Neurological: Positive for dizziness. Negative for tremors and weakness.      Physical Exam Triage Vital Signs ED Triage Vitals  Enc Vitals Group     BP 04/11/19 0818 116/62     Pulse Rate 04/11/19 0818 (!) 107     Resp 04/11/19 0818 16     Temp 04/11/19 0818 98.7 F (37.1 C)     Temp Source 04/11/19 0818 Oral     SpO2 04/11/19 0818 98 %     Weight --      Height --      Head Circumference --      Peak Flow --      Pain Score 04/11/19 0815 0     Pain Loc --      Pain Edu? --      Excl. in GC? --    No data found.  Updated Vital Signs BP 116/62 (BP Location: Right Arm)   Pulse (!) 107   Temp 98.7 F (37.1 C) (Oral)   Resp 16   LMP 12/09/2018   SpO2 98%   Visual Acuity Right Eye Distance:   Left Eye Distance:   Bilateral Distance:    Right Eye Near:   Left Eye Near:    Bilateral Near:     Physical Exam Vitals signs and nursing note reviewed.  Constitutional:      General: She is not in acute distress.    Appearance: Normal appearance. She is not ill-appearing or toxic-appearing.  HENT:     Right Ear: Tympanic membrane normal.     Left Ear: Tympanic membrane normal.     Nose: Nose normal.     Mouth/Throat:     Mouth: Mucous membranes are moist.  Neck:     Musculoskeletal: No neck rigidity or muscular tenderness.  Cardiovascular:     Rate and Rhythm: Regular rhythm. Tachycardia present.     Heart sounds: Normal heart sounds. No murmur. No gallop.   Pulmonary:     Effort: Pulmonary effort is normal.     Breath sounds: Normal breath sounds.  Abdominal:     General: Bowel sounds are normal.     Palpations: Abdomen is soft.  Musculoskeletal: Normal range of motion.  Skin:    General: Skin is warm.     Capillary Refill: Capillary refill takes less than 2 seconds.  Neurological:     General: No focal deficit present.     Mental Status: She is alert.      UC Treatments / Results  Labs (all labs ordered are listed, but only abnormal results are displayed) Labs Reviewed  POCT URINALYSIS DIP (DEVICE)    EKG    Radiology No results found.  Procedures Procedures (including critical care time)  Medications Ordered  in UC Medications - No data to display  Initial Impression / Assessment and Plan / UC Course  I have reviewed the triage vital signs and the nursing notes.  Pertinent labs & imaging results that were available during my care of the patient were reviewed by me and considered in my medical decision making (see chart for details).     1.  STD exposure: Urine is negative for urinary tract infection Cervical swab for GC/chlamydia/trichomonas HIV, RPR Safe sex practices recommended/advised  2.  Hypertension in pregnancy: Patient is currently being monitored.  She will have a virtual visit with MFM on November 9.  I will defer treatment to OB/GYN-MFM.  Patient is advised to keep her appointment.  If her symptoms worsen she is advised to reach out to her OB/GYN for further management. Final Clinical Impressions(s) / UC Diagnoses   Final diagnoses:  None   Discharge Instructions   None    ED Prescriptions    None     PDMP not reviewed this encounter.   Merrilee Jansky, MD 04/11/19 [phone]

## 2019-04-11 NOTE — ED Triage Notes (Addendum)
Pt present to the UC today for STD test. Pt denies andy sign and symptoms. Pt reports she is currently pregnant [redacted] weeks.

## 2019-04-12 ENCOUNTER — Telehealth: Payer: Self-pay | Admitting: *Deleted

## 2019-04-12 ENCOUNTER — Telehealth: Payer: Medicaid Other

## 2019-04-12 LAB — CERVICOVAGINAL ANCILLARY ONLY
Chlamydia: NEGATIVE
Neisseria Gonorrhea: NEGATIVE
Trichomonas: NEGATIVE

## 2019-04-12 NOTE — Telephone Encounter (Signed)
I called Kathleen [name] and asked if she had discussed the horizon results with anyone and she states she didn't think so . I reviewed there Horizon results ( carrier for sickle cell) with her and that it is recommended she get free genetic counseling thru [place] and partner testing (free). I gave her the number to call. She voices understanding. Linda,RN

## 2019-04-12 NOTE — Telephone Encounter (Signed)
[facility] sent a fax they are trying to reach [place] re: setting up a genetic counseling session re: her Horizon results.  Elvyn Krohn,RN

## 2019-04-13 ENCOUNTER — Telehealth: Payer: Self-pay | Admitting: *Deleted

## 2019-04-13 NOTE — Telephone Encounter (Signed)
Noted from babyscripts alert Lucely had an alert from BP of 143/60 on 04/07/19 . Also noted hx CHTN. Also noted had ED visit on 04/11/19 and had bp then of 116/62. No futher bp's noted in Babyscripts. I called Zianna and informed her I was following up from her elevated bp. I asked how she was feeling today. She states she is feeling fine.   She denies headache or edema. I asked if she could take her blood pressure for me and she said yes. She reports bp of 125/ 68. I informed her that is a good bp and to keep appointments as scheduled which I reviewed with her. She voices understanding.  Taegan Standage,RN

## 2019-04-19 ENCOUNTER — Telehealth: Payer: Self-pay

## 2019-04-19 NOTE — Telephone Encounter (Signed)
Called Pt to advise that we received letter from [facility] stating that she missed her phone consultation with a genetic counselor. Left VM for pt to either go online to reschedule or call [phone].

## 2019-04-25 ENCOUNTER — Ambulatory Visit (HOSPITAL_COMMUNITY): Payer: Medicaid Other | Admitting: *Deleted

## 2019-04-25 ENCOUNTER — Other Ambulatory Visit: Payer: Self-pay | Admitting: Lactation Services

## 2019-04-25 ENCOUNTER — Other Ambulatory Visit: Payer: Self-pay

## 2019-04-25 ENCOUNTER — Encounter (HOSPITAL_COMMUNITY): Payer: Self-pay

## 2019-04-25 ENCOUNTER — Ambulatory Visit (HOSPITAL_COMMUNITY)
Admission: RE | Admit: 2019-04-25 | Discharge: 2019-04-25 | Disposition: A | Discharge status: Home / Self Care | Payer: Medicaid Other | Source: Ambulatory Visit | Attending: Obstetrics and Gynecology | Admitting: Obstetrics and Gynecology

## 2019-04-25 VITALS — BP 133/69 | HR 99 | Temp 98.0°F

## 2019-04-25 DIAGNOSIS — O99282 Endocrine, nutritional and metabolic diseases complicating pregnancy, second trimester: Secondary | ICD-10-CM | POA: Diagnosis not present

## 2019-04-25 DIAGNOSIS — O0990 Supervision of high risk pregnancy, unspecified, unspecified trimester: Secondary | ICD-10-CM | POA: Diagnosis not present

## 2019-04-25 DIAGNOSIS — E0590 Thyrotoxicosis, unspecified without thyrotoxic crisis or storm: Secondary | ICD-10-CM

## 2019-04-25 DIAGNOSIS — O99212 Obesity complicating pregnancy, second trimester: Secondary | ICD-10-CM | POA: Diagnosis not present

## 2019-04-25 DIAGNOSIS — Z3A19 19 weeks gestation of pregnancy: Secondary | ICD-10-CM

## 2019-04-25 DIAGNOSIS — J45909 Unspecified asthma, uncomplicated: Secondary | ICD-10-CM

## 2019-04-25 DIAGNOSIS — O10912 Unspecified pre-existing hypertension complicating pregnancy, second trimester: Secondary | ICD-10-CM | POA: Insufficient documentation

## 2019-04-25 DIAGNOSIS — O10012 Pre-existing essential hypertension complicating pregnancy, second trimester: Secondary | ICD-10-CM

## 2019-04-25 DIAGNOSIS — O99891 Other specified diseases and conditions complicating pregnancy: Secondary | ICD-10-CM

## 2019-04-25 MED ORDER — LABETALOL HCL 100 MG PO TABS: 300.0000 mg | ORAL_TABLET | Freq: Two times a day (BID) | ORAL | 2 refills | Status: DC

## 2019-04-26 ENCOUNTER — Other Ambulatory Visit (HOSPITAL_COMMUNITY): Payer: Self-pay | Admitting: *Deleted

## 2019-04-26 DIAGNOSIS — O10912 Unspecified pre-existing hypertension complicating pregnancy, second trimester: Secondary | ICD-10-CM

## 2019-05-02 ENCOUNTER — Telehealth (INDEPENDENT_AMBULATORY_CARE_PROVIDER_SITE_OTHER): Payer: Medicaid Other | Admitting: Family Medicine

## 2019-05-02 VITALS — BP 129/66 | HR 97

## 2019-05-02 DIAGNOSIS — O99512 Diseases of the respiratory system complicating pregnancy, second trimester: Secondary | ICD-10-CM

## 2019-05-02 DIAGNOSIS — Z3A20 20 weeks gestation of pregnancy: Secondary | ICD-10-CM

## 2019-05-02 DIAGNOSIS — O10912 Unspecified pre-existing hypertension complicating pregnancy, second trimester: Secondary | ICD-10-CM

## 2019-05-02 DIAGNOSIS — O10919 Unspecified pre-existing hypertension complicating pregnancy, unspecified trimester: Secondary | ICD-10-CM

## 2019-05-02 DIAGNOSIS — O0990 Supervision of high risk pregnancy, unspecified, unspecified trimester: Secondary | ICD-10-CM

## 2019-05-02 DIAGNOSIS — O0992 Supervision of high risk pregnancy, unspecified, second trimester: Secondary | ICD-10-CM

## 2019-05-02 DIAGNOSIS — D573 Sickle-cell trait: Secondary | ICD-10-CM

## 2019-05-02 DIAGNOSIS — O99012 Anemia complicating pregnancy, second trimester: Secondary | ICD-10-CM

## 2019-05-02 DIAGNOSIS — O99511 Diseases of the respiratory system complicating pregnancy, first trimester: Secondary | ICD-10-CM

## 2019-05-02 DIAGNOSIS — R946 Abnormal results of thyroid function studies: Secondary | ICD-10-CM

## 2019-05-02 DIAGNOSIS — J45909 Unspecified asthma, uncomplicated: Secondary | ICD-10-CM

## 2019-05-02 MED ORDER — BUDESONIDE-FORMOTEROL FUMARATE 80-4.5 MCG/ACT IN AERO: 2.0000 | INHALATION_SPRAY | Freq: Two times a day (BID) | RESPIRATORY_TRACT | 12 refills | Status: DC

## 2019-05-02 NOTE — Progress Notes (Signed)
Called patient to get started with MyChart visit. Patient reported shortness of breath that started last week. Shortness of breat   TELEHEALTH OBSTETRICS PRENATAL VIRTUAL VIDEO VISIT ENCOUNTER NOTE  Provider location: [facility] [place]   I connected with [name] on 05/02/19 at 11:15 AM EST by MyChart Video Encounter at home and verified that I am speaking with the correct person using two identifiers.   I discussed the limitations, risks, security and privacy concerns of performing an evaluation and management service virtually and the availability of in person appointments. I also discussed with the patient that there may be a patient responsible charge related to this service. The patient expressed understanding and agreed to proceed. Subjective:  Kathleen [name] is a 28 y.o. G3P0020 at [redacted]w[redacted]d being seen today for ongoing prenatal care.  She is currently monitored for the following issues for this high-risk pregnancy and has Supervision of high risk pregnancy, antepartum; Chronic hypertension affecting pregnancy; Asthma affecting pregnancy in first trimester; Sickle cell trait (HCC); and Abnormal thyroid function test on their problem list.  Patient reports SOB with wheezing.  Contractions: Not present. Vag. Bleeding: None.  Movement: Present. Denies any leaking of fluid.   The following portions of the patient's history were reviewed and updated as appropriate: allergies, current medications, past family history, past medical history, past social history, past surgical history and problem list.   Objective:   Vitals:   05/02/19 1111  BP: 129/66  Pulse: 97    Fetal Status:     Movement: Present     General:  Alert, oriented and cooperative. Patient is in no acute distress.  Respiratory: Normal respiratory effort, no problems with respiration noted  Mental Status: Normal mood and affect. Normal behavior. Normal judgment and thought content.  Rest of  physical exam deferred due to type of encounter  Imaging: [place] Mfm Ob Detail +14 Wk  Result Date: 04/25/2019 ----------------------------------------------------------------------  OBSTETRICS REPORT                       (Signed Final 04/25/2019 02:55 pm) ---------------------------------------------------------------------- Patient Info  ID #:       [id number]                          D.O.B.:  [birth year] (28 yrs)  Name:       [facility]                  Visit Date: 04/25/2019 12:54 pm ---------------------------------------------------------------------- Performed By  Performed By:     Lenise Arena        Ref. Address:     [address]  [place], [place]  Attending:        Johnell Comings MD         [facility]  Referred By:      Chancy Milroy                    MD ---------------------------------------------------------------------- Orders   #  Description                          Code         Ordered By   1  [place] MFM OB DETAIL +14 [place]              98921.19     Arlina Robes  ----------------------------------------------------------------------   #  Order #                    Accession #                 Episode #   [id number]  [id number]                  [id number]                  [id number]  ---------------------------------------------------------------------- Indications   Hypertension - Chronic/Pre-existing            O10.019   (labetalol)   History of sickle cell trait (abnormal [facility] -Z86.2   carrier)   Obesity complicating pregnancy, second         O99.212   trimester (BMI 34)   Hyperthyroid                                   O99.280 E05.90   Encounter for antenatal screening for          Z36.3    malformations (low risk NIPS)   Asthma - (albuterol only)                      O99.89 j45.909   [redacted] weeks gestation of pregnancy                Z3A.19  ---------------------------------------------------------------------- Vital Signs  Weight (lb): 222                               Height:        5'7"  BMI:         34.77 ---------------------------------------------------------------------- Fetal Evaluation  Num Of Fetuses:         1  Fetal Heart Rate(bpm):  161  Cardiac Activity:       Observed  Presentation:           Cephalic  Placenta:               Anterior  P. Cord Insertion:      Visualized, central  Amniotic Fluid  AFI FV:      Within normal limits                              Largest Pocket(cm)                              5.95 ---------------------------------------------------------------------- Biometry  BPD:      42.5  mm     G. Age:  18w 6d         45  %    CI:        72.94   %    70 - 86                                                          FL/HC:      20.4   %    16.1 - 18.3  HC:      158.2  mm     G. Age:  18w 5d         28  %    HC/AC:      1.09        1.09 - 1.39  AC:      144.8  mm     G. Age:  19w 6d         72  %    FL/BPD:     75.8   %  FL:       32.2  mm     G. Age:  20w 0d         79  %    FL/AC:      22.2   %    20 - 24  CER:      19.3  mm     G. Age:  18w 5d         40  %  NFT:       4.7  mm  LV:        6.6  mm  CM:        4.3  mm  Est. FW:     311  gm    0 lb 11 oz      87  % ---------------------------------------------------------------------- OB History  Gravidity:    3         Term:   0 ---------------------------------------------------------------------- Gestational Age  U/S Today:     19w 3d                                        EDD:   09/16/19  Best:          19w 0d     Det. ByMarcella Dubs         EDD:   09/19/19                                      (  02/17/19) ---------------------------------------------------------------------- Anatomy  Cranium:               Appears  normal         Aortic Arch:            Appears normal  Cavum:                 Appears normal         Ductal Arch:            Appears normal  Ventricles:            Appears normal         Diaphragm:              Appears normal  Choroid Plexus:        Appears normal         Stomach:                Appears normal, left                                                                        sided  Cerebellum:            Appears normal         Abdomen:                Appears normal  Posterior Fossa:       Appears normal         Abdominal Wall:         Appears nml (cord                                                                        insert, abd wall)  Nuchal Fold:           Appears normal         Cord Vessels:           Appears normal (3                                                                        vessel cord)  Face:                  Appears normal         Kidneys:                Appear normal                         (orbits and profile)  Lips:                  Appears normal         Bladder:  Appears normal  Thoracic:              Appears normal         Spine:                  Appears normal  Heart:                 Appears normal         Upper Extremities:      Appears normal                         (4CH, axis, and                         situs)  RVOT:                  Appears normal         Lower Extremities:      Appears normal  LVOT:                  Appears normal  Other:  Female gender Heels and 5th digit visualized. Open hands visualized.          Technically difficult due to maternal habitus and fetal position. ---------------------------------------------------------------------- Cervix Uterus Adnexa  Cervix  Length:           3.74  cm.  Normal appearance by transabdominal scan.  Uterus  No abnormality visualized.  Left Ovary  Within normal limits.  Right Ovary  Within normal limits.  Cul De Sac  No free fluid seen.  Adnexa  No abnormality visualized.  ---------------------------------------------------------------------- Comments  This patient was seen for a detailed fetal anatomy scan due  to a history of chronic hypertension currently treated with  labetalol 300 mg twice a day, maternal obesity, and history of  hyperthyroidism that is not currently treated with any  medications.  The patient has screened positive as a carrier  of the sickle cell trait.  She reports that the father of the baby  has been uncooperative and will not get screened to  determine if he is also a carrier for the sickle cell trait. She  denies any problems in her current pregnancy.  She had a cell free DNA test earlier in her pregnancy which  indicated a low risk for trisomy 51, 54, and 13. A female fetus  is predicted.  She was informed that the fetal growth and amniotic fluid  level were appropriate for her gestational age.  There were no obvious fetal anomalies noted on today's  ultrasound exam.  The patient was informed that anomalies may be missed due  to technical limitations. If the fetus is in a suboptimal position  or maternal habitus is increased, visualization of the fetus in  the maternal uterus may be impaired.  The implications and management of chronic hypertension in  pregnancy was discussed. The patient was advised that  should her blood pressures continue to be elevated, the  dosage of her antihypertensive medications may need to be  increased.  The increased risk of superimposed  preeclampsia, an indicated preterm delivery, and possible  fetal growth restriction due to chronic hypertension in  pregnancy was discussed. The patient was advised that we  will continue to follow her closely throughout her pregnancy.  We will continue to follow her with monthly growth scans.  Weekly fetal testing should be started at around 32 weeks.  To decrease her risk of superimposed preeclampsia, she  should continue taking a daily baby aspirin (81 mg daily) for  preeclampsia  prophylaxis.  As the FOB has refused to get screened to determine if he is  a carrier for the sickle cell trait, the patient was offered and  declined an amniocentesis today for definitive diagnosis of  sickle cell disease.  A follow-up exam was scheduled in 4 weeks. ----------------------------------------------------------------------                   Ma Rings, MD Electronically Signed Final Report   04/25/2019 02:55 pm ----------------------------------------------------------------------   Assessment and Plan:  Pregnancy: G3P0020 at [redacted]w[redacted]d 1. Supervision of high risk pregnancy, antepartum Good fetal movement  2. Asthma affecting pregnancy in first trimester Start symbicort  3. Chronic hypertension affecting pregnancy Cont labetalol and asa  Growth [place] next month  4. SIckle cell trait FOB declines testing  5. Abnormal TSH Check TSH panel with next labs  Preterm labor symptoms and general obstetric precautions including but not limited to vaginal bleeding, contractions, leaking of fluid and fetal movement were reviewed in detail with the patient. I discussed the assessment and treatment plan with the patient. The patient was provided an opportunity to ask questions and all were answered. The patient agreed with the plan and demonstrated an understanding of the instructions. The patient was advised to call back or seek an in-person office evaluation/go to [facility] at [facility] for any urgent or concerning symptoms. Please refer to After Visit Summary for other counseling recommendations.   I provided 11 minutes of face-to-face time during this encounter.  Return in about 4 weeks (around 05/30/2019).  Future Appointments  Date Time Provider Department Center  05/24/2019 11:30 AM WH-MFC NURSE WH-MFC MFC-US  05/24/2019 11:30 AM WH-MFC [place] 5 WH-MFCUS MFC-US    Levie Heritage, DO   [facility] [employer], [facility] Margorie John occurs everyday. She is  having a lot of swelling/pain in her feet that started 2 days ago and lower back pain. Patient also reported a nose bleed around 9 AM. Nose bleeds occur once a week with clots. Patient denies dizziness, headaches and leg/calf pain.  Clovis Pu, RN

## 2019-05-16 ENCOUNTER — Telehealth (INDEPENDENT_AMBULATORY_CARE_PROVIDER_SITE_OTHER): Payer: Medicaid Other | Admitting: Lactation Services

## 2019-05-16 DIAGNOSIS — O0990 Supervision of high risk pregnancy, unspecified, unspecified trimester: Secondary | ICD-10-CM

## 2019-05-16 MED ORDER — PRENATAL PLUS 27-1 MG PO TABS: 1.0000 | ORAL_TABLET | Freq: Every day | ORAL | 6 refills | Status: DC

## 2019-05-16 NOTE — Telephone Encounter (Signed)
Pt called and LM on nurse voicemail about needing prescription refill for PNVShe has been taking OTC vitamins. She reports she cannot afford to continue them.   Prescription sent to Pharmacy, pt to call and ask how much she has to pain. Pt voiced understanding.

## 2019-05-21 ENCOUNTER — Ambulatory Visit (HOSPITAL_COMMUNITY)
Admission: EM | Admit: 2019-05-21 | Discharge: 2019-05-21 | Disposition: A | Discharge status: Home / Self Care | Payer: Medicaid Other | Attending: Family Medicine | Admitting: Family Medicine

## 2019-05-21 ENCOUNTER — Encounter (HOSPITAL_COMMUNITY): Payer: Self-pay

## 2019-05-21 ENCOUNTER — Other Ambulatory Visit: Payer: Self-pay

## 2019-05-21 DIAGNOSIS — N3000 Acute cystitis without hematuria: Secondary | ICD-10-CM

## 2019-05-21 LAB — POCT URINALYSIS DIP (DEVICE)
Bilirubin Urine: NEGATIVE
Glucose, UA: NEGATIVE mg/dL
Hgb urine dipstick: NEGATIVE
Ketones, ur: NEGATIVE mg/dL
Nitrite: NEGATIVE
Protein, ur: NEGATIVE mg/dL
Specific Gravity, Urine: 1.015 (ref 1.005–1.030)
Urobilinogen, UA: 0.2 mg/dL (ref 0.0–1.0)
pH: 7 (ref 5.0–8.0)

## 2019-05-21 MED ORDER — FOSFOMYCIN TROMETHAMINE 3 G PO PACK: 3.0000 g | PACK | Freq: Once | ORAL | 0 refills | Status: AC

## 2019-05-21 NOTE — ED Provider Notes (Signed)
  [place]    CSN: [id number] Arrival date & time: 05/21/19  1049  Chief Complaint  Patient presents with  . Urinary Tract Infection    Kathleen [name] is a 28 y.o. female here for possible UTI.  Duration: 2 days. Symptoms: Dysuria, urinary frequency, urinary hesitancy Denies: hematuria, fever, sweats, nausea, vomiting, urinary incontinence, flank pain, vaginal discharge Hx of recurrent UTI? No Started after intercourse, she denies any skin lesions or concerns from partner.   ROS:  Constitutional: denies fever GU: As noted in HPI  Past Medical History:  Diagnosis Date  . Asthma   . Hypertension      BP 123/79 (BP Location: Right Arm)   Pulse 66   Temp 99 F (37.2 C) (Oral)   Resp 18   LMP 12/09/2018   SpO2 99%  General: Awake, alert, appears stated age Heart: RRR Lungs: CTAB, normal respiratory effort, no accessory muscle usage Abd: BS+, soft, NT, ND, gravid uterus MSK: No CVA tenderness, neg Lloyd's sign Psych: Age appropriate judgment and insight   Final Clinical Impressions(s) / UC Diagnoses   Final diagnoses:  Acute cystitis without hematuria   Will check culture and urine ancillary testing as well.    Discharge Instructions     Give [place] a few business days to get the results of your urine back. No news is good news.     ED Prescriptions    Medication Sig Dispense Auth. Provider   fosfomycin (MONUROL) 3 g PACK Take 3 g by mouth once for 1 dose. 3 g Shelda Pal, DO     PDMP not reviewed this encounter.   Shelda Pal, DO 05/21/19 1225

## 2019-05-21 NOTE — ED Triage Notes (Signed)
Pt present urinary tract infection that started two days ago. Pt is also pregnant. Pt would like to get a std panel as well.

## 2019-05-21 NOTE — Discharge Instructions (Signed)
Give [place] a few business days to get the results of your urine back. No news is good news.

## 2019-05-24 ENCOUNTER — Other Ambulatory Visit: Payer: Self-pay

## 2019-05-24 ENCOUNTER — Ambulatory Visit (HOSPITAL_COMMUNITY)
Admission: RE | Admit: 2019-05-24 | Discharge: 2019-05-24 | Disposition: A | Discharge status: Home / Self Care | Payer: Medicaid Other | Source: Ambulatory Visit | Attending: Obstetrics and Gynecology | Admitting: Obstetrics and Gynecology

## 2019-05-24 ENCOUNTER — Encounter (HOSPITAL_COMMUNITY): Payer: Self-pay

## 2019-05-24 ENCOUNTER — Other Ambulatory Visit (HOSPITAL_COMMUNITY): Payer: Self-pay | Admitting: *Deleted

## 2019-05-24 ENCOUNTER — Ambulatory Visit (HOSPITAL_COMMUNITY): Payer: Medicaid Other | Admitting: *Deleted

## 2019-05-24 VITALS — BP 124/63 | HR 97 | Temp 97.3°F

## 2019-05-24 DIAGNOSIS — O10912 Unspecified pre-existing hypertension complicating pregnancy, second trimester: Secondary | ICD-10-CM | POA: Diagnosis present

## 2019-05-24 DIAGNOSIS — O0990 Supervision of high risk pregnancy, unspecified, unspecified trimester: Secondary | ICD-10-CM | POA: Diagnosis present

## 2019-05-24 DIAGNOSIS — Z3A23 23 weeks gestation of pregnancy: Secondary | ICD-10-CM

## 2019-05-24 DIAGNOSIS — Z362 Encounter for other antenatal screening follow-up: Secondary | ICD-10-CM

## 2019-05-24 DIAGNOSIS — O10919 Unspecified pre-existing hypertension complicating pregnancy, unspecified trimester: Secondary | ICD-10-CM

## 2019-05-24 DIAGNOSIS — O99212 Obesity complicating pregnancy, second trimester: Secondary | ICD-10-CM

## 2019-05-24 DIAGNOSIS — J45909 Unspecified asthma, uncomplicated: Secondary | ICD-10-CM

## 2019-05-24 DIAGNOSIS — O10012 Pre-existing essential hypertension complicating pregnancy, second trimester: Secondary | ICD-10-CM

## 2019-05-24 DIAGNOSIS — O99282 Endocrine, nutritional and metabolic diseases complicating pregnancy, second trimester: Secondary | ICD-10-CM

## 2019-05-24 DIAGNOSIS — E0590 Thyrotoxicosis, unspecified without thyrotoxic crisis or storm: Secondary | ICD-10-CM

## 2019-05-24 DIAGNOSIS — O99891 Other specified diseases and conditions complicating pregnancy: Secondary | ICD-10-CM

## 2019-05-30 ENCOUNTER — Telehealth: Payer: Self-pay | Admitting: Family Medicine

## 2019-05-30 NOTE — Telephone Encounter (Signed)
Attempted to call patient about her appointment change information ( from office to virtual). No answer, left voicemail instructing patient that her appointment in now a virtual visit through mychart.

## 2019-05-31 ENCOUNTER — Telehealth (INDEPENDENT_AMBULATORY_CARE_PROVIDER_SITE_OTHER): Payer: Medicaid Other | Admitting: Family Medicine

## 2019-05-31 ENCOUNTER — Other Ambulatory Visit: Payer: Self-pay

## 2019-05-31 VITALS — BP 114/66

## 2019-05-31 DIAGNOSIS — O10919 Unspecified pre-existing hypertension complicating pregnancy, unspecified trimester: Secondary | ICD-10-CM

## 2019-05-31 DIAGNOSIS — O0990 Supervision of high risk pregnancy, unspecified, unspecified trimester: Secondary | ICD-10-CM

## 2019-05-31 DIAGNOSIS — O10913 Unspecified pre-existing hypertension complicating pregnancy, third trimester: Secondary | ICD-10-CM

## 2019-05-31 DIAGNOSIS — J45909 Unspecified asthma, uncomplicated: Secondary | ICD-10-CM

## 2019-05-31 DIAGNOSIS — O0993 Supervision of high risk pregnancy, unspecified, third trimester: Secondary | ICD-10-CM

## 2019-05-31 DIAGNOSIS — R040 Epistaxis: Secondary | ICD-10-CM

## 2019-05-31 DIAGNOSIS — Z3A24 24 weeks gestation of pregnancy: Secondary | ICD-10-CM

## 2019-05-31 DIAGNOSIS — R946 Abnormal results of thyroid function studies: Secondary | ICD-10-CM

## 2019-05-31 DIAGNOSIS — O99513 Diseases of the respiratory system complicating pregnancy, third trimester: Secondary | ICD-10-CM

## 2019-05-31 MED ORDER — MUPIROCIN 2 % EX OINT: 1.0000 "application " | TOPICAL_OINTMENT | Freq: Two times a day (BID) | CUTANEOUS | 0 refills | Status: DC

## 2019-05-31 NOTE — Progress Notes (Signed)
TELEHEALTH OBSTETRICS PRENATAL VIRTUAL VIDEO VISIT ENCOUNTER NOTE  Provider location: [facility] [place]   I connected with Kathleen [name] on 05/31/19 at 10:55 AM EST by MyChart Video Encounter at home and verified that I am speaking with the correct person using two identifiers.   I discussed the limitations, risks, security and privacy concerns of performing an evaluation and management service virtually and the availability of in person appointments. I also discussed with the patient that there may be a patient responsible charge related to this service. The patient expressed understanding and agreed to proceed. Subjective:  Kathleen [name] is a 28 y.o. G3P0020 at [redacted]w[redacted]d being seen today for ongoing prenatal care.  She is currently monitored for the following issues for this high-risk pregnancy and has Supervision of high risk pregnancy, antepartum; Chronic hypertension affecting pregnancy; Asthma affecting pregnancy in first trimester; Sickle cell trait (HCC); and Abnormal thyroid function test on their problem list.  Patient reports intermittent nose bleeds.  Contractions: Not present. Vag. Bleeding: None.  Movement: Present. Denies any leaking of fluid.   The following portions of the patient's history were reviewed and updated as appropriate: allergies, current medications, past family history, past medical history, past social history, past surgical history and problem list.   Objective:   Vitals:   05/31/19 1120  BP: 114/66    Fetal Status:     Movement: Present     General:  Alert, oriented and cooperative. Patient is in no acute distress.  Respiratory: Normal respiratory effort, no problems with respiration noted  Mental Status: Normal mood and affect. Normal behavior. Normal judgment and thought content.  Rest of physical exam deferred due to type of encounter  Imaging: [place] Mfm Ob Follow Up  Result Date: 05/24/2019  ----------------------------------------------------------------------  OBSTETRICS REPORT                       (Signed Final 05/24/2019 12:21 pm) ---------------------------------------------------------------------- Patient Info  ID #:       [id number]                          D.O.B.:  09-Jan-1991 (28 yrs)  Name:       Kathleen [name]                  Visit Date: 05/24/2019 11:14 am ---------------------------------------------------------------------- Performed By  Performed By:     Birdena Crandall        Ref. Address:     [address]                                                             [place], [place]  [id number]  Attending:        Ma RingsVictor Fang MD         [facility]  Referred By:      Hermina StaggersMICHAEL L ERVIN                    MD ---------------------------------------------------------------------- Orders   #  Description                          Code         Ordered By   1  US MFM OB FOLLOW UP                  [phone]     YU FANG  ----------------------------------------------------------------------   #  Order #                    Accession #                 Episode #   1  [id number]                  [id number]                  [id number]  ---------------------------------------------------------------------- Indications   Hypertension - Chronic/Pre-existing            O10.019   (labetalol)   History of sickle cell trait (abnormal Natera -Z86.2   carrier)   Obesity complicating pregnancy, second         O99.212   trimester (BMI 34)   Hyperthyroid                                   O99.280 E05.90   Encounter for antenatal screening for          Z36.3   malformations (low risk NIPS)   Asthma - (albuterol only)                      O99.89 j45.909   [redacted] weeks gestation of  pregnancy                Z3A.23  ---------------------------------------------------------------------- Vital Signs  Weight (lb): 222                               Height:        5'7"  BMI:         34.77 ---------------------------------------------------------------------- Fetal Evaluation  Num Of Fetuses:         1  Fetal Heart Rate(bpm):  153  Cardiac Activity:       Observed  Presentation:           Cephalic  Placenta:               Anterior  P. Cord Insertion:      Visualized, central  Amniotic Fluid  AFI FV:  Within normal limits                              Largest Pocket(cm)                              6.42 ---------------------------------------------------------------------- Biometry  BPD:      55.3  mm     G. Age:  22w 6d         34  %    CI:        70.48   %    70 - 86                                                          FL/HC:      19.9   %    19.2 - 20.8  HC:       210   mm     G. Age:  23w 1d         32  %    HC/AC:      1.11        1.05 - 1.21  AC:      189.1  mm     G. Age:  23w 5d         59  %    FL/BPD:     75.6   %    71 - 87  FL:       41.8  mm     G. Age:  23w 4d         54  %    FL/AC:      22.1   %    20 - 24  Est. FW:     605  gm      1 lb 5 oz     63  % ---------------------------------------------------------------------- OB History  Gravidity:    3         Term:   0 ---------------------------------------------------------------------- Gestational Age  U/S Today:     23w 2d                                        EDD:   09/18/19  Best:          23w 1d     Det. By:  Kathleen Dubs         EDD:   09/19/19                                      (02/17/19) ---------------------------------------------------------------------- Anatomy  Cranium:               Appears normal         Aortic Arch:            Appears normal  Cavum:                 Previously seen        Ductal Arch:            Appears normal  Ventricles:            Appears normal         Diaphragm:              Previously  seen  Choroid Plexus:        Previously seen        Stomach:                Appears normal, left                                                                        sided  Cerebellum:            Previously seen        Abdomen:                Appears normal  Posterior Fossa:       Previously seen        Abdominal Wall:         Appears nml (cord                                                                        insert, abd wall)  Nuchal Fold:           Previously seen        Cord Vessels:           Appears normal (3                                                                        vessel cord)  Face:                  Orbits and profile     Kidneys:                Appear normal                         previously seen  Lips:                  Appears normal         Bladder:                Appears normal  Thoracic:              Appears normal         Spine:                  Previously seen  Heart:                 Appears normal         Upper Extremities:  Previously seen                         (4CH, axis, and                         situs)  RVOT:                  Appears normal         Lower Extremities:      Previously seen  LVOT:                  Appears normal  Other:  Female gender Heels, 5th digit and Open hands previously          visualized. Technically difficult due to maternal habitus and fetal          position. ---------------------------------------------------------------------- Cervix Uterus Adnexa  Cervix  Length:              3  cm.  Normal appearance by transabdominal scan.  Uterus  No abnormality visualized. ---------------------------------------------------------------------- Comments  This patient was seen for a follow up growth scan due to a  history of chronic hypertension currently treated with labetalol  100 mg twice a day.  She denies any problems since her last  exam.  She was informed that the fetal growth and amniotic fluid  level appears appropriate for her gestational age.   A follow up exam was scheduled in 4 weeks. ----------------------------------------------------------------------                   Johnell Comings, MD Electronically Signed Final Report   05/24/2019 12:21 pm ----------------------------------------------------------------------   Assessment and Plan:  Pregnancy: [id number] at [redacted]w[redacted]d 1. Supervision of high risk pregnancy, antepartum Good fetal movement  2. Chronic hypertension affecting pregnancy BP controlled. ASA 81mg  Labetalol 300mg  bid Serial growth [place]  3. Asthma affecting pregnancy in first trimester controlled  4. Abnormal thyroid function test Recheck at 28 weeks  5. Epistaxis Mupirocin prescribed to decrease possible bacterial burden and for additional moisture.  Preterm labor symptoms and general obstetric precautions including but not limited to vaginal bleeding, contractions, leaking of fluid and fetal movement were reviewed in detail with the patient. I discussed the assessment and treatment plan with the patient. The patient was provided an opportunity to ask questions and all were answered. The patient agreed with the plan and demonstrated an understanding of the instructions. The patient was advised to call back or seek an in-person office evaluation/go to [facility] at [facility] for any urgent or concerning symptoms. Please refer to After Visit Summary for other counseling recommendations.   I provided 9 minutes of face-to-face time during this encounter.  Return in about 4 weeks (around 06/28/2019) for HR OB f/u, In Office, 2 hr GTT.  Future Appointments  Date Time Provider [place]  06/21/2019 10:30 AM [place] 1 WH-MFCUS MFC-US  06/21/2019 10:35 AM [place] for [employer], [place]

## 2019-05-31 NOTE — Patient Instructions (Signed)
Childbirth Education Options: [facility] Classes:  Childbirth education classes can help you get ready for a positive parenting experience. You can also meet other expectant parents and get free stuff for your baby. Each class runs for five weeks on the same night and costs $45 for the mother-to-be and her support person. Medicaid covers the cost if you are eligible. Call [phone] to register. [facility] Childbirth Education:  [phone] or [phone] or [email or web address]  Baby & Me Class: Discuss newborn & infant parenting and family adjustment issues with other new mothers in a relaxed environment. Each week brings a new speaker or baby-centered activity. We encourage new mothers to join us every Thursday at 11:00am. Babies birth until crawling. No registration or fee. Daddy Boot Camp: This course offers Dads-to-be the tools and knowledge needed to feel confident on their journey to becoming new fathers. Experienced dads, who have been trained as coaches, teach dads-to-be how to hold, comfort, diaper, swaddle and play with their infant while being able to support the new mom as well. A class for men taught by men. $25/dad Big Brother/Big Sister: Let your children share in the joy of a new brother or sister in this special class designed just for them. Class includes discussion about how families care for babies: swaddling, holding, diapering, safety as well as how they can be helpful in their new role. This class is designed for children ages 2 to 6, but any age is welcome. Please register each child individually. $5/child  Mom Talk: This mom-led group offers support and connection to mothers as they journey through the adjustments and struggles of that sometimes overwhelming first year after the birth of a child. Tuesdays at 10:00am and Thursdays at 6:00pm. Babies welcome. No registration or fee. Breastfeeding Support Group: This group is a mother-to-mother  support circle where moms have the opportunity to share their breastfeeding experiences. A Lactation Consultant is present for questions and concerns. Meets each Tuesday at 11:00am. No fee or registration. Breastfeeding Your Baby: Learn what to expect in the first days of breastfeeding your newborn.  This class will help you feel more confident with the skills needed to begin your breastfeeding experience. Many new mothers are concerned about breastfeeding after leaving the hospital. This class will also address the most common fears and challenges about breastfeeding during the first few weeks, months and beyond. (call for fee) Comfort Techniques and Tour: This 2 hour interactive class will provide you the opportunity to learn & practice hands-on techniques that can help relieve some of the discomfort of labor and encourage your baby to rotate toward the best position for birth. You and your partner will be able to try a variety of labor positions with birth balls and rebozos as well as practice breathing, relaxation, and visualization techniques. A tour of the [facility] is included with this class. $20 per registrant and support person Childbirth Class- Weekend Option: This class is a Weekend version of our Birth & Baby series. It is designed for parents who have a difficult time fitting several weeks of classes into their schedule. It covers the care of your newborn and the basics of labor and childbirth. It also includes a Maternity Care Center Tour of [facility] and lunch. The class is held two consecutive days: beginning on Friday evening from 6:30 - 8:30 p.m. and the next day, Saturday from 9 a.m. - 4 p.m. (call for fee) Waterbirth Class: Interested in a waterbirth?  This   informational class will help you discover whether waterbirth is the right fit for you. Education about waterbirth itself, supplies you would need and how to assemble your support team is what you can  expect from this class. Some obstetrical practices require this class in order to pursue a waterbirth. (Not all obstetrical practices offer waterbirth-check with your healthcare provider.) Register only the expectant mom, but you are encouraged to bring your partner to class! Required if planning waterbirth, no fee. Infant/Child CPR: Parents, grandparents, babysitters, and friends learn Cardio-Pulmonary Resuscitation skills for infants and children. You will also learn how to treat both conscious and unconscious choking in infants and children. This Family & Friends program does not offer certification. Register each participant individually to ensure that enough mannequins are available. (Call for fee) Grandparent Love: Expecting a grandbaby? This class is for you! Learn about the latest infant care and safety recommendations and ways to support your own child as he or she transitions into the parenting role. Taught by Registered Nurses who are childbirth instructors, but most importantly...they are grandmothers too! $10/person. Childbirth Class- Natural Childbirth: This series of 5 weekly classes is for expectant parents who want to learn and practice natural methods of coping with the process of labor and childbirth. Relaxation, breathing, massage, visualization, role of the partner, and helpful positioning are highlighted. Participants learn how to be confident in their body's ability to give birth. This class will empower and help parents make informed decisions about their own care. Includes discussion that will help new parents transition into the immediate postpartum period. Maternity Care Center Tour of [facility] is included. We suggest taking this class between 25-32 weeks, but it's only a recommendation. $75 per registrant and one support person or $30 Medicaid. Childbirth Class- 3 week Series: This option of 3 weekly classes helps you and your labor partner prepare for childbirth. Newborn  care, labor & birth, cesarean birth, pain management, and comfort techniques are discussed and a Maternity Care Center Tour of [facility] is included. The class meets at the same time, on the same day of the week for 3 consecutive weeks beginning with the starting date you choose. $60 for registrant and one support person.  Marvelous Multiples: Expecting twins, triplets, or more? This class covers the differences in labor, birth, parenting, and breastfeeding issues that face multiples' parents. NICU tour is included. Led by a Certified Childbirth Educator who is the mother of twins. No fee. Caring for Baby: This class is for expectant and adoptive parents who want to learn and practice the most up-to-date newborn care for their babies. Focus is on birth through the first six weeks of life. Topics include feeding, bathing, diapering, crying, umbilical cord care, circumcision care and safe sleep. Parents learn to recognize symptoms of illness and when to call the pediatrician. Register only the mom-to-be and your partner or support person can plan to come with you! $10 per registrant and support person Childbirth Class- online option: This online class offers you the freedom to complete a Birth and Baby series in the comfort of your own home. The flexibility of this option allows you to review sections at your own pace, at times convenient to you and your support people. It includes additional video information, animations, quizzes, and extended activities. Get organized with helpful eClass tools, checklists, and trackers. Once you register online for the class, you will receive an email within a few days to accept the invitation and begin the class when the time   is right for you. The content will be available to you for 60 days. $60 for 60 days of online access for you and your support people.  Local Doulas: Natural Baby Doulas [email or web address] Tel:  [phone] [email or web address] [facility] [phone] [email or web address] The Labor Ladies  (also do waterbirth tub rental) [phone] [email or web address] [facility] [employer] 336-312-4678 [email or web address] Sacred [employer]hythms  [phone] [email or web address] [place] [employer] (PADA) [email or web address] [email or web address].org/index.htm [place] Birth and Baby  [email or web address] Considering Waterbirth? Guide for patients at [facility]  Why consider waterbirth?  . Gentle birth for babies . Less pain medicine used in labor . May allow for passive descent/less pushing . May reduce perineal tears  . More mobility and instinctive maternal position changes . Increased maternal relaxation . Reduced blood pressure in labor  Is waterbirth safe? What are the risks of infection, drowning or other complications?  . Infection: o Very low risk (3.7 % for tub vs 4.8% for bed) o 7 in 8000 waterbirths with documented infection o Poorly cleaned equipment most common cause o Slightly lower group B strep transmission rate  . Drowning o Maternal:  - Very low risk   - Related to seizures or fainting o Newborn:  - Very low risk. No evidence of increased risk of respiratory problems in multiple large studies - Physiological protection from breathing under water - Avoid underwater birth if there are any fetal complications - Once baby's head is out of the water, keep it out.  . Birth complication o Some reports of cord trauma, but risk decreased by bringing baby to surface gradually o No evidence of increased risk of shoulder dystocia. Mothers can usually change positions faster in water than in a bed, possibly aiding the maneuvers to free the shoulder.   You must attend a Waterbirth class at [facility]  3rd Wednesday of every month from 7-9pm  Free  Register by calling [phone] or online at [email or web address]  Bring us the certificate from the class to your prenatal appointment  Meet with a midwife at 36 weeks to see if you can still plan a waterbirth and to sign the consent.   Purchase or rent the following supplies:   Water Birth Pool (Birth Pool in a Box or [place] for instance)  (Tubs start ~$125)  Single-use disposable tub liner designed for your brand of tub  New garden hose labeled "lead-free", "suitable for drinking water",  Electric drain pump to remove water (We recommend 792 gallon per hour or greater pump.)   Separate garden hose to remove the dirty water  Fish net  Bathing suit top (optional)  Long-handled mirror (optional)  Places to purchase or rent supplies  [email or web address] for tub purchases and supplies  Waterbirthsolutions.com for tub purchases and supplies  The Labor Ladies ([email or web address]) $275 for tub rental/set-up & take down/kit   [place] [employer] (http://www.padanc.org/MeetUs.htm) Information regarding doulas (labor support) who provide pool rentals  Our practice has a Birth Pool in a Box tub at the hospital that you may borrow on a first-come-first-served basis. It is your responsibility to to set up, clean and break down the tub. We cannot guarantee the availability of this tub in advance. You are responsible for bringing all accessories listed above. If you do not have all necessary supplies you cannot have a waterbirth.    Things that would prevent you from having a waterbirth:  Premature, <37wks  Previous cesarean birth  Presence of thick meconium-stained fluid  Multiple gestation (Twins,   triplets, etc.)  Uncontrolled diabetes or gestational diabetes requiring medication  Hypertension requiring medication or diagnosis of pre-eclampsia  Heavy vaginal bleeding  Non-reassuring fetal  heart rate  Active infection (MRSA, etc.). Group B Strep is NOT a contraindication for  waterbirth.  If your labor has to be induced and induction method requires continuous  monitoring of the baby's heart rate  Other risks/issues identified by your obstetrical provider  Please remember that birth is unpredictable. Under certain unforeseeable circumstances your provider may advise against giving birth in the tub. These decisions will be made on a case-by-case basis and with the safety of you and your baby as our highest priority.      

## 2019-06-06 ENCOUNTER — Encounter: Payer: Self-pay | Admitting: General Practice

## 2019-06-06 DIAGNOSIS — J45909 Unspecified asthma, uncomplicated: Secondary | ICD-10-CM

## 2019-06-08 MED ORDER — ALBUTEROL SULFATE HFA 108 (90 BASE) MCG/ACT IN AERS: 1.0000 | INHALATION_SPRAY | Freq: Four times a day (QID) | RESPIRATORY_TRACT | 0 refills | Status: DC | PRN

## 2019-06-15 ENCOUNTER — Telehealth: Payer: Self-pay | Admitting: Lactation Services

## 2019-06-15 DIAGNOSIS — O0990 Supervision of high risk pregnancy, unspecified, unspecified trimester: Secondary | ICD-10-CM

## 2019-06-15 NOTE — Telephone Encounter (Signed)
Kathleen [name] from [place]. Kathleen [name] reports that she is not able to reach patient and was wanting to know of pt is attending her OB appts. Confirmed that pt is attending her appts. She would like to refer pt to [facility] for follow up. Orders placed.

## 2019-06-20 ENCOUNTER — Other Ambulatory Visit: Payer: Self-pay

## 2019-06-20 MED ORDER — LABETALOL HCL 100 MG PO TABS: 300.0000 mg | ORAL_TABLET | Freq: Two times a day (BID) | ORAL | 2 refills | Status: DC

## 2019-06-21 ENCOUNTER — Other Ambulatory Visit: Payer: Self-pay

## 2019-06-21 ENCOUNTER — Ambulatory Visit (HOSPITAL_COMMUNITY)
Admission: RE | Admit: 2019-06-21 | Discharge: 2019-06-21 | Disposition: A | Discharge status: Home / Self Care | Payer: Medicaid Other | Source: Ambulatory Visit | Attending: Obstetrics and Gynecology | Admitting: Obstetrics and Gynecology

## 2019-06-21 ENCOUNTER — Ambulatory Visit (HOSPITAL_COMMUNITY): Payer: Medicaid Other | Admitting: *Deleted

## 2019-06-21 ENCOUNTER — Encounter (HOSPITAL_COMMUNITY): Payer: Self-pay

## 2019-06-21 ENCOUNTER — Other Ambulatory Visit (HOSPITAL_COMMUNITY): Payer: Self-pay | Admitting: *Deleted

## 2019-06-21 DIAGNOSIS — O0990 Supervision of high risk pregnancy, unspecified, unspecified trimester: Secondary | ICD-10-CM

## 2019-06-21 DIAGNOSIS — O10012 Pre-existing essential hypertension complicating pregnancy, second trimester: Secondary | ICD-10-CM

## 2019-06-21 DIAGNOSIS — O10919 Unspecified pre-existing hypertension complicating pregnancy, unspecified trimester: Secondary | ICD-10-CM | POA: Diagnosis not present

## 2019-06-21 DIAGNOSIS — Z362 Encounter for other antenatal screening follow-up: Secondary | ICD-10-CM | POA: Diagnosis not present

## 2019-06-21 DIAGNOSIS — O99891 Other specified diseases and conditions complicating pregnancy: Secondary | ICD-10-CM

## 2019-06-21 DIAGNOSIS — O99282 Endocrine, nutritional and metabolic diseases complicating pregnancy, second trimester: Secondary | ICD-10-CM | POA: Diagnosis not present

## 2019-06-21 DIAGNOSIS — O99212 Obesity complicating pregnancy, second trimester: Secondary | ICD-10-CM

## 2019-06-21 DIAGNOSIS — Z3A27 27 weeks gestation of pregnancy: Secondary | ICD-10-CM

## 2019-06-21 DIAGNOSIS — J45909 Unspecified asthma, uncomplicated: Secondary | ICD-10-CM

## 2019-06-21 DIAGNOSIS — E0590 Thyrotoxicosis, unspecified without thyrotoxic crisis or storm: Secondary | ICD-10-CM

## 2019-06-21 NOTE — BH Specialist Note (Deleted)
Integrated Behavioral Health via Telemedicine Video Visit  06/21/2019 Kathleen [name] [id number]  Number of [place] visits: 1 Session Start time: 8:15***  Session End time: 9:15*** Total time: {IBH Total DTOI:71245809}  Referring Provider: Kerry Hough, PA-C Type of Visit: Video Patient/Family location: Home [facility] Provider location: [facility] All persons participating in visit: Patient *** and [place] ***   Confirmed patient's address: Yes  Confirmed patient's phone number: Yes  Any changes to demographics: No   Confirmed patient's insurance: Yes  Any changes to patient's insurance: No   Discussed confidentiality: Yes ***  I connected with Kathleen [name]  by a video enabled telemedicine application and verified that I am speaking with the correct person using two identifiers.     I discussed the limitations of evaluation and management by telemedicine and the availability of in person appointments.  I discussed that the purpose of this visit is to provide behavioral health care while limiting exposure to the novel coronavirus.   Discussed there is a possibility of technology failure and discussed alternative modes of communication if that failure occurs.  I discussed that engaging in this video visit, they consent to the provision of behavioral healthcare and the services will be billed under their insurance.  Patient and/or legal guardian expressed understanding and consented to video visit: Yes   PRESENTING CONCERNS: Patient and/or family reports the following symptoms/concerns: *** Duration of problem: ***; Severity of problem: {Mild/Moderate/Severe:20260}  STRENGTHS (Protective Factors/Coping Skills): ***  GOALS ADDRESSED: Patient will: 1.  Reduce symptoms of: {IBH Symptoms:21014056}  2.  Increase knowledge and/or ability of: {IBH Patient Tools:21014057}  3.  Demonstrate ability to: {IBH Goals:21014053}  INTERVENTIONS: Interventions  utilized:  {IBH Interventions:21014054} Standardized Assessments completed: {IBH Screening Tools:21014051}  ASSESSMENT: Patient currently experiencing ***.   Patient may benefit from psychoeducation and brief therapeutic interventions regarding coping with symptoms of *** .  PLAN: 1. Follow up with behavioral health clinician on : *** 2. Behavioral recommendations:  -*** -*** 3. Referral(s): {IBH Referrals:21014055}  I discussed the assessment and treatment plan with the patient and/or parent/guardian. They were provided an opportunity to ask questions and all were answered. They agreed with the plan and demonstrated an understanding of the instructions.   They were advised to call back or seek an in-person evaluation if the symptoms worsen or if the condition fails to improve as anticipated.  Kathleen [name] Kathleen [name]  Depression screen [facility] 2/9 03/21/2019  Decreased Interest 1  Down, Depressed, Hopeless 1  PHQ - 2 Score 2  Altered sleeping 2  Tired, decreased energy 2  Change in appetite 0  Feeling bad or failure about yourself  0  Trouble concentrating 1  Moving slowly or fidgety/restless 0  Suicidal thoughts 0  PHQ-9 Score 7  Difficult doing work/chores Not difficult at all   GAD 7 : Generalized Anxiety Score 03/21/2019  Nervous, Anxious, on Edge 0  Control/stop worrying 0  Worry too much - different things 1  Trouble relaxing 1  Restless 1  Easily annoyed or irritable 1  Afraid - awful might happen 0  Total GAD 7 Score 4

## 2019-06-24 NOTE — L&D Delivery Note (Addendum)
Kathleen [name] is a 29 y.o. female G3P0020 with IUP at [redacted]w[redacted]d admitted for IOL for [facility] .  She progressed with cytotec, FB and pitocin augmentation to complete and pushed 10 minutes to deliver.  Cord clamping delayed by several minutes then clamped by CNM and cut by FOB.    Delivery Note At 3:32 AM a viable female was delivered via Vaginal, Spontaneous (Presentation: Right Occiput Anterior). APGAR: , ; weight pending.   Placenta status: Spontaneous, Intact.  Cord: 3 vessels with the following complications: tight nuchal x1, delivered through with somersault maneuver and reduced after.   After delivery of placenta, uterus boggy with brisk bright red bleeding. IV pitocin started, TXA and methergine given. Uterus would firm with massage but boggy with brisk bleeding when stopped. Cytotec given and bleeding slowed, uterus now firm.   Anesthesia: Epidural Episiotomy: None Lacerations:  periclitoral/periurethral Suture Repair: 4.0 monocryl, Dr. Alysia Penna at bedside to assist with repair.  Est. Blood Loss (mL):  500  Mom to postpartum.  Baby to Couplet care / Skin to Skin.  Foley catheter inserted after repair. Will leave in place x12hrs.   Rolm Bookbinder CNM 09/12/2019, 4:08 AM

## 2019-06-28 ENCOUNTER — Other Ambulatory Visit: Payer: Self-pay

## 2019-06-28 ENCOUNTER — Ambulatory Visit (INDEPENDENT_AMBULATORY_CARE_PROVIDER_SITE_OTHER): Payer: Medicaid Other | Admitting: Family Medicine

## 2019-06-28 ENCOUNTER — Other Ambulatory Visit: Payer: Medicaid Other

## 2019-06-28 VITALS — BP 115/66 | HR 105 | Wt 239.4 lb

## 2019-06-28 DIAGNOSIS — Z23 Encounter for immunization: Secondary | ICD-10-CM | POA: Diagnosis not present

## 2019-06-28 DIAGNOSIS — D573 Sickle-cell trait: Secondary | ICD-10-CM | POA: Diagnosis not present

## 2019-06-28 DIAGNOSIS — R946 Abnormal results of thyroid function studies: Secondary | ICD-10-CM

## 2019-06-28 DIAGNOSIS — O99283 Endocrine, nutritional and metabolic diseases complicating pregnancy, third trimester: Secondary | ICD-10-CM

## 2019-06-28 DIAGNOSIS — O10913 Unspecified pre-existing hypertension complicating pregnancy, third trimester: Secondary | ICD-10-CM | POA: Diagnosis not present

## 2019-06-28 DIAGNOSIS — J45909 Unspecified asthma, uncomplicated: Secondary | ICD-10-CM

## 2019-06-28 DIAGNOSIS — Z3A28 28 weeks gestation of pregnancy: Secondary | ICD-10-CM

## 2019-06-28 DIAGNOSIS — E0590 Thyrotoxicosis, unspecified without thyrotoxic crisis or storm: Secondary | ICD-10-CM | POA: Diagnosis not present

## 2019-06-28 DIAGNOSIS — O99513 Diseases of the respiratory system complicating pregnancy, third trimester: Secondary | ICD-10-CM | POA: Diagnosis not present

## 2019-06-28 DIAGNOSIS — O10919 Unspecified pre-existing hypertension complicating pregnancy, unspecified trimester: Secondary | ICD-10-CM

## 2019-06-28 DIAGNOSIS — O0990 Supervision of high risk pregnancy, unspecified, unspecified trimester: Secondary | ICD-10-CM

## 2019-06-28 DIAGNOSIS — O0993 Supervision of high risk pregnancy, unspecified, third trimester: Secondary | ICD-10-CM | POA: Diagnosis not present

## 2019-06-28 MED ORDER — METHIMAZOLE 5 MG PO TABS: 5.0000 mg | ORAL_TABLET | Freq: Every day | ORAL | 5 refills | Status: DC

## 2019-06-28 NOTE — Patient Instructions (Signed)

## 2019-06-28 NOTE — Progress Notes (Signed)
   Subjective:  Kathleen [name] is a 29 y.o. G3P0020 at [redacted]w[redacted]d being seen today for ongoing prenatal care.  She is currently monitored for the following issues for this high-risk pregnancy and has Supervision of high risk pregnancy, antepartum; Chronic hypertension affecting pregnancy; Asthma affecting pregnancy in first trimester; Sickle cell trait (HCC); and Hyperthyroidism affecting pregnancy on their problem list.  Patient reports no bleeding, no contractions and no leaking.  Contractions: Not present. Vag. Bleeding: None.  Movement: Present. Denies leaking of fluid.   The following portions of the patient's history were reviewed and updated as appropriate: allergies, current medications, past family history, past medical history, past social history, past surgical history and problem list. Problem list updated.  Objective:   Vitals:   06/28/19 0822  BP: 115/66  Pulse: (!) 105  Weight: 239 lb 6.4 oz (108.6 kg)    Fetal Status:   Fundal Height: 28 cm Movement: Present      General:  Alert, oriented and cooperative. Patient is in no acute distress.  Skin: Skin is warm and dry. No rash noted.   Cardiovascular: Normal heart rate noted  Respiratory: Normal respiratory effort, no problems with respiration noted  Abdomen: Soft, gravid, appropriate for gestational age. Pain/Pressure: Present     Pelvic: Vag. Bleeding: None     Cervical exam deferred        Extremities: Normal range of motion.  Edema: None  Mental Status: Normal mood and affect. Normal behavior. Normal judgment and thought content.   Urinalysis:      Assessment and Plan:  Pregnancy: G3P0020 at [redacted]w[redacted]d  1. Supervision of high risk pregnancy, antepartum Doing well 28wk labs Accepts TDAP - Tdap vaccine greater than or equal to 7yo IM  2. Chronic hypertension affecting pregnancy BP well controlled today Compliant with meds  3. Asthma affecting pregnancy in first trimester Asymptomatic  4. Abnormal thyroid function  test  5. Sickle cell trait (HCC)   6. Hyperthyroidism affecting pregnancy in third trimester Today reporting some palpitations off and on as well as tremor that was also noted on exam No goiter appreciated on exam Discussed w Dr. Parke Poisson, given development of symptoms and previously elevated thyroid levels will initiate treatment today Methimazole 5mg  daily Draw TFTs today, recheck in 4 weeks - TSH - T3 - T3, free - T4 - T4, free - methimazole (TAPAZOLE) 5 MG tablet; Take 1 tablet (5 mg total) by mouth daily.  Dispense: 30 tablet; Refill: 5  Preterm labor symptoms and general obstetric precautions including but not limited to vaginal bleeding, contractions, leaking of fluid and fetal movement were reviewed in detail with the patient. Please refer to After Visit Summary for other counseling recommendations.  Return in 2 weeks (on 07/12/2019) for [facility], needs MD, virtual.   [facility], MD

## 2019-06-29 ENCOUNTER — Telehealth: Payer: Self-pay | Admitting: Family Medicine

## 2019-06-29 DIAGNOSIS — O99283 Endocrine, nutritional and metabolic diseases complicating pregnancy, third trimester: Secondary | ICD-10-CM

## 2019-06-29 DIAGNOSIS — E0590 Thyrotoxicosis, unspecified without thyrotoxic crisis or storm: Secondary | ICD-10-CM

## 2019-06-29 LAB — TSH: TSH: <0.005 u[IU]/mL — ABNORMAL LOW (ref 0.450–4.500)

## 2019-06-29 LAB — CBC
Hematocrit: 37.6 % (ref 34.0–46.6)
Hemoglobin: 12.7 g/dL (ref 11.1–15.9)
MCH: 28.9 pg (ref 26.6–33.0)
MCHC: 33.8 g/dL (ref 31.5–35.7)
MCV: 86 fL (ref 79–97)
Platelets: 247 10*3/uL (ref 150–450)
RBC: 4.39 x10E6/uL (ref 3.77–5.28)
RDW: 13.9 % (ref 11.7–15.4)
WBC: 8.4 10*3/uL (ref 3.4–10.8)

## 2019-06-29 LAB — T3: T3, Total: 290 ng/dL — ABNORMAL HIGH (ref 71–180)

## 2019-06-29 LAB — T4: T4, Total: 13.9 ug/dL — ABNORMAL HIGH (ref 4.5–12.0)

## 2019-06-29 LAB — RPR: RPR Ser Ql: NONREACTIVE

## 2019-06-29 LAB — T4, FREE: Free T4: 1.33 ng/dL (ref 0.82–1.77)

## 2019-06-29 LAB — GLUCOSE TOLERANCE, 2 HOURS W/ 1HR
Glucose, 1 hour: 93 mg/dL (ref 65–179)
Glucose, 2 hour: 78 mg/dL (ref 65–152)
Glucose, Fasting: 74 mg/dL (ref 65–91)

## 2019-06-29 LAB — HIV ANTIBODY (ROUTINE TESTING W REFLEX): HIV Screen 4th Generation wRfx: NONREACTIVE

## 2019-06-29 LAB — T3, FREE: T3, Free: 4.6 pg/mL — ABNORMAL HIGH (ref 2.0–4.4)

## 2019-06-29 NOTE — Telephone Encounter (Addendum)
[facility] [phone] scheduled for September 2nd @ 1305.    [facility] called and LM.  Waiting for return call back.   [facility] @ [phone], [phone] [address] [place], [place] contacted with Dr. Smith Robert 07/08/19 @ 1300.    Pt notified of appt and MyChart message sent to pt with appt information.  Pt verbalized understanding.    Leonette Nutting

## 2019-06-29 NOTE — Telephone Encounter (Signed)
Called patient to discuss results of labs from [facility] yesterday.  Normal 2hr GTT, CBC, HIV, RPR.  Reviewed TFTs. Her TSH continues to be quite low but T3/T4 were actually only mildly elevated and improved from prior check. Given her mild symptoms and borderline labs instructed her to discontinue methimazole, we will refer her to Endocrinology to connect her with care to further evaluate the etiology and if necessary based on their evaluation can resume methimazole.   Order placed, please coordinate referral.

## 2019-06-30 ENCOUNTER — Institutional Professional Consult (permissible substitution): Payer: Medicaid Other

## 2019-07-05 NOTE — Telephone Encounter (Signed)
[facility] returned call and I informed them that pt already has an appt with another office.    Addison Naegeli, RN

## 2019-07-06 NOTE — BH Specialist Note (Signed)
Integrated Behavioral Health via Telemedicine Video Visit  07/06/2019 Kathleen [name] [id number]  Number of Integrated Behavioral Health visits: 1 Session Start time: 10:56  Session End time: 11:17 Total time: 21  Referring Provider: Vonzella Nipple, PA-C Type of Visit: Video Patient/Family location: Home [facility] Provider location: [facility] All persons participating in visit: Patient Kathleen [name] and [facility] Deanie Jupiter    Confirmed patient's address: Yes  Confirmed patient's phone number: Yes  Any changes to demographics: No   Confirmed patient's insurance: Yes  Any changes to patient's insurance: No   Discussed confidentiality: Yes   I connected with [name] by a video enabled telemedicine application and verified that I am speaking with the correct person using two identifiers.     I discussed the limitations of evaluation and management by telemedicine and the availability of in person appointments.  I discussed that the purpose of this visit is to provide behavioral health care while limiting exposure to the novel coronavirus.   Discussed there is a possibility of technology failure and discussed alternative modes of communication if that failure occurs.  I discussed that engaging in this video visit, they consent to the provision of behavioral healthcare and the services will be billed under their insurance.  Patient and/or legal guardian expressed understanding and consented to video visit: Yes   PRESENTING CONCERNS: Patient and/or family reports the following symptoms/concerns: Pt states her primary symptom is mild sleep difficulty, attributed to waking up with "crazy dreams" during pregnancy, and feelings related to loss of mother in 2019. Pt feels well-supported by her sister.  Duration of problem: Current pregnancy; Severity of problem: mild  STRENGTHS (Protective Factors/Coping Skills): Good social support; open to treatment if needed in the future  GOALS  ADDRESSED: Patient will: 1.  Reduce symptoms of: stress  2.  Increase knowledge and/or ability of: stress reduction  3.  Demonstrate ability to: Increase healthy adjustment to current life circumstances  INTERVENTIONS: Interventions utilized:  Supportive Counseling and Psychoeducation and/or Health Education Standardized Assessments completed: Not Needed  ASSESSMENT: Patient currently experiencing Supervision of high risk pregnancy, antepartum.   Patient may benefit from psychoeducation and brief therapeutic interventions regarding maintaining reduction of stress during pregnancy .  PLAN: 1. Follow up with behavioral health clinician on : As needed 2. Behavioral recommendations:  -Continue taking prenatal vitamin, as prescribed -Continue with plan to attend childbirth education class on 07/13/19 -Continue with plan to apply for work-at-home job options postpartum -Read through Postpartum Planner in After Visit Summary; share with support person(s) -Consider apps, as discussed, for additional self-care  3. Referral(s): Integrated [employer] (In Clinic)  I discussed the assessment and treatment plan with the patient and/or parent/guardian. They were provided an opportunity to ask questions and all were answered. They agreed with the plan and demonstrated an understanding of the instructions.   They were advised to call back or seek an in-person evaluation if the symptoms worsen or if the condition fails to improve as anticipated.  Valetta Close Deanie Jupiter    Depression screen [facility] 2/9 06/28/2019 03/21/2019  Decreased Interest 0 1  Down, Depressed, Hopeless 0 1  PHQ - 2 Score 0 2  Altered sleeping 1 2  Tired, decreased energy 0 2  Change in appetite 0 0  Feeling bad or failure about yourself  0 0  Trouble concentrating 0 1  Moving slowly or fidgety/restless 0 0  Suicidal thoughts 0 0  PHQ-9 Score 1 7  Difficult doing work/chores - Not difficult at  all   GAD 7 :  Generalized Anxiety Score 06/28/2019 03/21/2019  Nervous, Anxious, on Edge 0 0  Control/stop worrying 0 0  Worry too much - different things 0 1  Trouble relaxing 0 1  Restless 0 1  Easily annoyed or irritable 0 1  Afraid - awful might happen 0 0  Total GAD 7 Score 0 4

## 2019-07-07 ENCOUNTER — Ambulatory Visit (INDEPENDENT_AMBULATORY_CARE_PROVIDER_SITE_OTHER): Payer: Medicaid Other | Admitting: Clinical

## 2019-07-07 DIAGNOSIS — O0990 Supervision of high risk pregnancy, unspecified, unspecified trimester: Secondary | ICD-10-CM | POA: Diagnosis not present

## 2019-07-07 NOTE — Patient Instructions (Signed)
BRAINSTORMING  Develop a Plan Goals: . Provide a way to start conversation about your new life with a baby . Assist parents in recognizing and using resources within their reach . Help pave the way before birth for an easier period of transition afterwards.  Make a list of the following information to keep in a central location: . Full name of Mom and Partner: _____________________________________________ . 25 full name and Date of Birth: ___________________________________________ . Home Address: ___________________________________________________________ ________________________________________________________________________ . Home Phone: ____________________________________________________________ . Parents' cell numbers: _____________________________________________________ ________________________________________________________________________ . Name and contact info for OB: ______________________________________________ . Name and contact info for Pediatrician:________________________________________ . Contact info for Lactation Consultants: ________________________________________  REST and SLEEP *You each need at least 4-5 hours of uninterrupted sleep every day. Write specific names and contact information.* . How are you going to rest in the postpartum period? While partner's home? When partner returns to work? When you both return to work? Marland Kitchen Where will your baby sleep? Marland Kitchen Who is available to help during the day? Evening? Night? . Who could move in for a period to help support you? Marland Kitchen What are some ideas to help you get enough sleep? __________________________________________________________________________________________________________________________________________________________________________________________________________________________________________ NUTRITIOUS FOOD AND DRINK *Plan for meals before your baby is born so you can have healthy food to eat  during the immediate postpartum period.* . Who will look after breakfast? Lunch? Dinner? List names and contact information. Brainstorm quick, healthy ideas for each meal. . What can you do before baby is born to prepare meals for the postpartum period? . How can others help you with meals? Marland Kitchen Which grocery stores provide online shopping and delivery? Marland Kitchen Which restaurants offer take-out or delivery options? ______________________________________________________________________________________________________________________________________________________________________________________________________________________________________________________________________________________________________________________________________________________________________________________________________  CARE FOR MOM *It's important that mom is cared for and pampered in the postpartum period. Remember, the most important ways new mothers need care are: sleep, nutrition, gentle exercise, and time off.* . Who can come take care of mom during this period? Make a list of people with their contact information. . List some activities that make you feel cared for, rested, and energized? Who can make sure you have opportunities to do these things? . Does mom have a space of her very own within your home that's just for her? Make a "[facility]" where she can be comfortable, rest, and renew herself daily. ______________________________________________________________________________________________________________________________________________________________________________________________________________________________________________________________________________________________________________________________________________________________________________________________________    CARE FOR AND FEEDING BABY *Knowledgeable and encouraging people will offer the best support with regard to feeding your  baby.* . Educate yourself and choose the best feeding option for your baby. . Make a list of people who will guide, support, and be a resource for you as your care for and feed your baby. (Friends that have breastfed or are currently breastfeeding, lactation consultants, breastfeeding support groups, etc.) . Consider a postpartum doula. (These websites can give you information: dona.org & BuyingShow.es) . Seek out local breastfeeding resources like the breastfeeding support group at [employer] or [employer]. ______________________________________________________________________________________________________________________________________________________________________________________________________________________________________________________________________________________________________________________________________________________________________________________________________  Verner Chol AND ERRANDS . Who can help with a thorough cleaning before baby is born? . Make a list of people who will help with housekeeping and chores, like laundry, light cleaning, dishes, bathrooms, etc. . Who can run some errands for you? Marland Kitchen What can you do to make sure you are stocked with basic supplies before baby is born? . Who is going to do the shopping? ______________________________________________________________________________________________________________________________________________________________________________________________________________________________________________________________________________________________________________________________________________________________________________________________________     Family Adjustment *Nurture yourselves.it helps parents be more loving and allows for better bonding with their child.* .  What sorts of things do you and partner enjoy doing together? Which activities help you to connect and strengthen your relationship? Make a list of  those things. Make a list of people whom you trust to care for your baby so you can have some time together as a couple. . What types of things help partner feel connected to Mom? Make a list. . What needs will partner have in order to bond with baby? . Other children? Who will care for them when you go into labor and while you are in the hospital? . Think about what the needs of your older children might be. Who can help you meet those needs? In what ways are you helping them prepare for bringing baby home? List some specific strategies you have for family adjustment. _______________________________________________________________________________________________________________________________________________________________________________________________________________________________________________________________________________________________________________________________________________  SUPPORT *Someone who can empathize with experiences normalizes your problems and makes them more bearable.* . Make a list of other friends, neighbors, and/or co-workers you know with infants (and small children, if applicable) with whom you can connect. . Make a list of local or online support groups, mom groups, etc. in which you can be involved. ______________________________________________________________________________________________________________________________________________________________________________________________________________________________________________________________________________________________________________________________________________________________________________________________________  Childcare Plans . Investigate and plan for childcare if mom is returning to work. . Talk about mom's concerns about her transition back to work. . Talk about partner's concerns regarding this transition.  Mental Health *Your mental health is one of the highest priorities for  a pregnant or postpartum mom.* . 1 in 5 women experience anxiety and/or depression from the time of conception through the first year after birth. . Postpartum Mood Disorders are the #1 complication of pregnancy and childbirth and the suffering experienced by these mothers is not necessary! These illnesses are temporary and respond well to treatment, which often includes self-care, social support, talk therapy, and medication when needed. . Women experiencing anxiety and depression often say things like: "I'm supposed to be happy.why do I feel so sad?", "Why can't I snap out of it?", "I'm having thoughts that scare me." . There is no need to be embarrassed if you are feeling these symptoms: o Overwhelmed, anxious, angry, sad, guilty, irritable, hopeless, exhausted but can't sleep o You are NOT alone. You are NOT to blame. With help, you WILL be well. . Where can I find help? Medical professionals such as your OB, midwife, gynecologist, family practitioner, primary care provider, pediatrician, or mental health providers; [facility] support groups: Feelings After Birth, Breastfeeding Support Group, Baby and Me Group, and Fit 4 Two exercise classes. . You have permission to ask for help. It will confirm your feelings, validate your experiences, share/learn coping strategies, and gain support and encouragement as you heal. You are important! BRAINSTORM . Make a list of local resources, including resources for mom and for partner. . Identify support groups. . Identify people to call late at night - include names and contact info. . Talk with partner about perinatal mood and anxiety disorders. . Talk with your OB, midwife, and doula about baby blues and about perinatal mood and anxiety disorders. . Talk with your pediatrician about perinatal mood and anxiety disorders.   Support & Sanity Savers   What do you really need?  . Basics . In preparing for a new baby, many expectant parents spend  hours shopping for baby clothes, decorating the nursery, and deciding which car seat to buy. Yet most don't think much about what the reality of parenting a newborn will be like, and what they need to make  it through that. So, here is the advice of experienced parents. We know you'll read this, and think "they're exaggerating, I don't really need that." Just trust [place] on these, OK? Plan for all of this, and if it turns out you don't need it, come back and teach [place] how you did it!  Satira Anis (Once baby's survival needs are met, make sure you attend to your own survival needs!) . Sleep . An average newborn sleeps 16-18 hours per day, over 6-7 sleep periods, rarely more than three hours at a time. It is normal and healthy for a newborn to wake throughout the night... but really hard on parents!! . Naps. Prioritize sleep above any responsibilities like: cleaning house, visiting friends, running errands, etc.  Sleep whenever baby sleeps. If you can't nap, at least have restful times when baby eats. The more rest you get, the more patient you will be, the more emotionally stable, and better at solving problems.  . Food . You may not have realized it would be difficult to eat when you have a newborn. Yet, when we talk to . countless new parents, they say things like "it may be 2:00 pm when I realize I haven't had breakfast yet." Or "every time we sit down to dinner, baby needs to eat, and my food gets cold, so I don't bother to eat it." . Finger food. Before your baby is born, stock up with one months' worth of food that: 1) you can eat with one hand while holding a baby, 2) doesn't need to be prepped, 3) is good hot or cold, 4) doesn't spoil when left out for a few hours, and 5) you like to eat. Think about: nuts, dried fruit, Clif bars, pretzels, jerky, gogurt, baby carrots, apples, bananas, crackers, cheez-n-crackers, string cheese, hot pockets or frozen burritos to microwave, garden burgers and breakfast  pastries to put in the toaster, yogurt drinks, etc. . Restaurant Menus. Make lists of your favorite restaurants & menu items. When family/friends want to help, you can give specific information without much thought. They can either bring you the food or send gift cards for just the right meals. Rosaura Carpenter Meals.  Take some time to make a few meals to put in the freezer ahead of time.  Easy to freeze meals can be anything such as soup, lasagna, chicken pie, or spaghetti sauce. . Set up a Meal Schedule.  Ask friends and family to sign up to bring you meals during the first few weeks of being home. (It can be passed around at baby showers!) You have no idea how helpful this will be until you are in the throes of parenting.  https://hamilton-woodard.com/ is a great website to check out. . Emotional Support . Know who to call when you're stressed out. Parenting a newborn is very challenging work. There are times when it totally overwhelms your normal coping abilities. EVERY NEW PARENT NEEDS TO HAVE A PLAN FOR WHO TO CALL WHEN THEY JUST CAN'T COPE ANY MORE. (And it has to be someone other than the baby's other parent!) Before your baby is born, come up with at least one person you can call for support - write their phone number down and post it on the refrigerator. Marland Kitchen Anxiety & Sadness. Baby blues are normal after pregnancy; however, there are more severe types of anxiety & sadness which can occur and should not be ignored.  They are always treatable, but you have to take the first step by  reaching out for help. [facility] offers a "Mom Talk" group which meets every Tuesday from 10 am - 11 am.  This group is for new moms who need support and connection after their babies are born.  Call [phone].  Marland Kitchen Really, Really Helpful (Plan for them! Make sure these happen often!!) . Physical Support with Taking Care of Yourselves . Asking friends and family. Before your baby is born, set up a schedule of people who can come  and visit and help out (or ask a friend to schedule for you). Any time someone says "let me know what I can do to help," sign them up for a day. When they get there, their job is not to take care of the baby (that's your job and your joy). Their job is to take care of you!  . Postpartum doulas. If you don't have anyone you can call on for support, look into postpartum doulas:  professionals at helping parents with caring for baby, caring for themselves, getting breastfeeding started, and helping with household tasks. www.padanc.org is a helpful website for learning about doulas in our area. . Peer Support / Parent Groups . Why: One of the greatest ideas for new parents is to be around other new parents. Parent groups give you a chance to share and listen to others who are going through the same season of life, get a sense of what is normal infant development by watching several babies learn and grow, share your stories of triumph and struggles with empathetic ears, and forgive your own mistakes when you realize all parents are learning by trial and error. . Where to find: There are many places you can meet other new parents throughout our community.  [facility] offers the following classes for new moms and their little ones:  Baby and Me (Birth to [place]) and Breastfeeding Support Group. Go to www.conehealthybaby.com or call [phone] for more information. . Time for your Relationship . It's easy to get so caught up in meeting baby's immediate needs that it's hard to find time to connect with your partner, and meet the needs of your relationship. It's also easy to forget what "quality time with your partner" actually looks like. If you take your baby on a date, you'd be amazed how much of your couple time is spent feeding the baby, diapering the baby, admiring the baby, and talking about the baby. . Dating: Try to take time for just the two of you. Babysitter tip: Sometimes when moms are  breastfeeding a newborn, they find it hard to figure out how to schedule outings around baby's unpredictable feeding schedules. Have the babysitter come for a three hour period. When she comes over, if baby has just eaten, you can leave right away, and come back in two hours. If baby hasn't fed recently, you start the date at home. Once baby gets hungry and gets a good feeding in, you can head out for the rest of your date time. . Date Nights at Home: If you can't get out, at least set aside one evening a week to prioritize your relationship: whenever baby dozes off or doesn't have any immediate needs, spend a little time focusing on each other. . Potential conflicts: The main relationship conflicts that come up for new parents are: issues related to sexuality, financial stresses, a feeling of an unfair division of household tasks, and conflicts in parenting styles. The more you can work on these issues before baby arrives, the better!  Marland Kitchen  Fun and Frills (Don't forget these. and don't feel guilty for indulging in them!) . Everyone has something in life that is a fun little treat that they do just for themselves. It may be: reading the morning paper, or going for a daily jog, or having coffee with a friend once a week, or going to a movie on Friday nights, or fine chocolates, or bubble baths, or curling up with a good book. . Unless you do fun things for yourself every now and then, it's hard to have the energy for fun with your baby. Whatever your "special" treats are, make sure you find a way to continue to indulge in them after your baby is born. These special moments can recharge you, and allow you to return to baby with a new joy   PERINATAL MOOD DISORDERS: [place]   Emergency and Crisis Resources:  If you are an imminent risk to self or others, are experiencing intense personal distress, and/or have noticed significant changes in activities  of daily living, call:  . 911 . [facility]: [phone] . Mobile Crisis: 832-633-7482 . National Suicide Hotline: (845) 361-7870 Or visit the following crisis centers: . Local Emergency Departments . [facility]: [address], [place]. Hours: 8:30AM-5PM. Insurance Accepted: Medicaid, Medicare, and Uninsured.  Marland Kitchen [facility]  [address] [place] Mon-Friday 8am-3pm  [phone]                                                                                    Non-Crisis Resources: To identify specific providers that are covered by your insurance, contact your insurance company or local agencies: [place]: [phone] [facility] and [place]: [phone] [place]: [phone] Postpartum Support International- [place] 1-201 820 0921                                                      Outpatient therapy and medication management providers:  [facility] [phone] Hours: 9AM-5PM  Insurance Accepted: Alben Spittle, Lorella Nimrod, Freddrick March, [place], Medicare  [facility] ([place]) [phone] Hours: 8AM-5PM  nsurance Accepted: All insurances EXCEPT AARP, [place], [place], and [place]: [phone]             Hours: 8AM-8PM Insurance Accepted: Cristal Ford, Freddrick March, [place], Medicare, [phone] [facility]: [phone] Hours: 8:30AM-7PM Insurance Accepted: Cristal Ford, Medicaid, Medicare, Tricare, [employer] Counseling:  [place] Accepted:  Holland Falling, Lorella Nimrod, [facility], [place], [facility] [phone] Hours: 9AM-5:30PM Insurance Accepted: Alben Spittle, Charlotte Crumb, and Medicaid, Medicare, [employer] [facility] Counsel[facility]:   [phone] Hours: 9am-5pm Insurance Accepted: BCBS; they do not accept Medicaid/Medicare [place]: [phone] Hours: 9am-9pm Insurance Accepted: All major insurance including Medicaid and Medicare [facility]: [phone] Hours: 9AM-5:30PM Insurance  Accepted: All insurances EXCEPT Medicaid and Medicare. [facility]: [place]: [phone] [place]:  [place] (support for children in the NICU and/or with special needs), [employer]: [phone]                                                                                     Online Resources: Postpartum Support International: http://jones-berg.com/  800-944-4PPD 2Moms Supporting Moms:  Www.momssupportingmoms.net   /Emotional [employer] and Websites Here are a few free apps meant to help you to help yourself.  To find, try searching on the internet to see if the app is offered on Apple/Android devices. If your first choice doesn't come up on your device, the good news is that there are many choices! Play around with different apps to see which ones are helpful to you.    Calm This is an app meant to help increase calm feelings. Includes info, strategies, and tools for tracking your feelings.      Calm Harm  This app is meant to help with self-harm. Provides many 5-minute or 15-min coping strategies for doing instead of hurting yourself.       [place] is a problem-solving tool to help deal with emotions and cope with stress you encounter wherever you are.      MindShift This app can help people cope with anxiety. Rather than trying to avoid anxiety, you can make an important  shift and face it.      MY3  MY3 features a support system, safety plan and resources with the goal of offering a tool to use in a time of need.       My Life My Voice  This mood journal offers a simple solution for tracking your thoughts, feelings and moods. Animated emoticons can help identify your mood.       Relax Melodies Designed to help with sleep, on this app you can mix sounds and meditations for relaxation.      Smiling Mind Smiling Mind is meditation made easy: it's a simple tool that helps put a smile on your mind.        Stop, Breathe & Think  A friendly, simple guide for people through meditations for mindfulness and compassion.  Stop, Breathe and Think  Kids Enter your current feelings and choose a "mission" to help you cope. Offers videos for certain moods instead of just sound recordings.       Team Orange The goal of this tool is to help teens change how they think, act, and react. This app helps you focus on your own good feelings and experiences.      The [place] Box The [place] Box (VHB) contains simple tools to help patients with coping, relaxation, distraction, and positive thinking.

## 2019-07-11 NOTE — Telephone Encounter (Signed)
After several attempts to reach [facility] was able to cancel pt's September appt due being able to get an appt with [facility] on 07/08/19.    Addison Naegeli, RN

## 2019-07-13 ENCOUNTER — Encounter: Payer: Self-pay | Admitting: Obstetrics and Gynecology

## 2019-07-13 ENCOUNTER — Telehealth (INDEPENDENT_AMBULATORY_CARE_PROVIDER_SITE_OTHER): Payer: Medicaid Other | Admitting: Obstetrics and Gynecology

## 2019-07-13 ENCOUNTER — Other Ambulatory Visit: Payer: Self-pay

## 2019-07-13 VITALS — BP 113/66 | HR 91

## 2019-07-13 DIAGNOSIS — O10913 Unspecified pre-existing hypertension complicating pregnancy, third trimester: Secondary | ICD-10-CM

## 2019-07-13 DIAGNOSIS — O99511 Diseases of the respiratory system complicating pregnancy, first trimester: Secondary | ICD-10-CM

## 2019-07-13 DIAGNOSIS — O99513 Diseases of the respiratory system complicating pregnancy, third trimester: Secondary | ICD-10-CM

## 2019-07-13 DIAGNOSIS — E0590 Thyrotoxicosis, unspecified without thyrotoxic crisis or storm: Secondary | ICD-10-CM

## 2019-07-13 DIAGNOSIS — O99283 Endocrine, nutritional and metabolic diseases complicating pregnancy, third trimester: Secondary | ICD-10-CM

## 2019-07-13 DIAGNOSIS — O10919 Unspecified pre-existing hypertension complicating pregnancy, unspecified trimester: Secondary | ICD-10-CM

## 2019-07-13 DIAGNOSIS — O0990 Supervision of high risk pregnancy, unspecified, unspecified trimester: Secondary | ICD-10-CM

## 2019-07-13 DIAGNOSIS — O0993 Supervision of high risk pregnancy, unspecified, third trimester: Secondary | ICD-10-CM

## 2019-07-13 DIAGNOSIS — J45909 Unspecified asthma, uncomplicated: Secondary | ICD-10-CM

## 2019-07-13 DIAGNOSIS — Z3A30 30 weeks gestation of pregnancy: Secondary | ICD-10-CM

## 2019-07-13 NOTE — Progress Notes (Signed)
TELEHEALTH OBSTETRICS PRENATAL VIRTUAL VIDEO VISIT ENCOUNTER NOTE  Provider location: Center for [facility] at [place]   I connected with [name] on 07/13/19 at  1:55 PM EST by MyChart Video Encounter at home and verified that I am speaking with the correct person using two identifiers.   I discussed the limitations, risks, security and privacy concerns of performing an evaluation and management service virtually and the availability of in person appointments. I also discussed with the patient that there may be a patient responsible charge related to this service. The patient expressed understanding and agreed to proceed. Subjective:  Kathleen [name] is a 29 y.o. G3P0020 at [redacted]w[redacted]d being seen today for ongoing prenatal care.  She is currently monitored for the following issues for this high-risk pregnancy and has Supervision of high risk pregnancy, antepartum; Chronic hypertension affecting pregnancy; Asthma affecting pregnancy in first trimester; Sickle cell trait (HCC); and Hyperthyroidism affecting pregnancy on their problem list.  Patient reports difficulty sleeping, wondering what the process for labor will be.  Contractions: Not present. Vag. Bleeding: None.  Movement: Present. Denies any leaking of fluid.   The following portions of the patient's history were reviewed and updated as appropriate: allergies, current medications, past family history, past medical history, past social history, past surgical history and problem list.   Objective:   Vitals:   07/13/19 1345  BP: 113/66  Pulse: 91   Fetal Status:     Movement: Present     General:  Alert, oriented and cooperative. Patient is in no acute distress.  Respiratory: Normal respiratory effort, no problems with respiration noted  Mental Status: Normal mood and affect. Normal behavior. Normal judgment and thought content.  Rest of physical exam deferred due to type of encounter  Imaging:  Assessment and Plan:    Pregnancy: G3P0020 at [redacted]w[redacted]d  1. Supervision of high risk pregnancy, antepartum - Reviewed hospital policies regarding visitors, masking, COVID testing, etc. - Having trouble sleeping, reviewed strategies to improve sleep  2. Chronic hypertension affecting pregnancy Cont labetalol 300 mg BID Cont baby ASA  3. Asthma affecting pregnancy in first trimester  4. Hyperthyroidism affecting pregnancy in third trimester - T4/T3 close to normal range, stopped methimazole last week per Dr. Lupita Leash  - saw Dr. Smith Robert with endocrinology last week - thyroid receptor Ab negative - she is currently not on methimazole per endocrinology - has f/u in 2 weeks for another blood draw  Preterm labor symptoms and general obstetric precautions including but not limited to vaginal bleeding, contractions, leaking of fluid and fetal movement were reviewed in detail with the patient. I discussed the assessment and treatment plan with the patient. The patient was provided an opportunity to ask questions and all were answered. The patient agreed with the plan and demonstrated an understanding of the instructions. The patient was advised to call back or seek an in-person office evaluation/go to MAU at [facility] for any urgent or concerning symptoms. Please refer to After Visit Summary for other counseling recommendations.   I provided 20 minutes of face-to-face time during this encounter.  Return in about 2 weeks (around 07/27/2019) for high OB, virtual.  Future Appointments  Date Time Provider Department Center  07/26/2019  9:40 AM WH-MFC NURSE WH-MFC MFC-US  07/26/2019  9:45 AM WH-MFC [place] 2 WH-MFCUS MFC-US  08/02/2019  9:40 AM WH-MFC NURSE WH-MFC MFC-US  08/02/2019  9:45 AM WH-MFC [place] 2 WH-MFCUS MFC-US  08/09/2019  9:40 AM WH-MFC NURSE WH-MFC MFC-US  08/09/2019  9:45 AM [place] 2 WH-MFCUS MFC-US    Sloan Leiter, [place] for [facility], [place]

## 2019-07-13 NOTE — Progress Notes (Signed)
I connected with  Kathleen [name] on 07/13/19 at 1344 by telephone and verified that I am speaking with the correct person using two identifiers.   I discussed the limitations, risks, security and privacy concerns of performing an evaluation and management service by telephone and the availability of in person appointments. I also discussed with the patient that there may be a patient responsible charge related to this service. The patient expressed understanding and agreed to proceed.  Marjo Bicker, RN 07/13/2019  1:44 PM

## 2019-07-26 ENCOUNTER — Ambulatory Visit (HOSPITAL_COMMUNITY)
Admission: RE | Admit: 2019-07-26 | Discharge: 2019-07-26 | Disposition: A | Discharge status: Home / Self Care | Payer: Medicaid Other | Source: Ambulatory Visit | Attending: Obstetrics and Gynecology | Admitting: Obstetrics and Gynecology

## 2019-07-26 ENCOUNTER — Other Ambulatory Visit: Payer: Self-pay

## 2019-07-26 ENCOUNTER — Ambulatory Visit (HOSPITAL_COMMUNITY): Payer: Medicaid Other | Admitting: *Deleted

## 2019-07-26 ENCOUNTER — Encounter (HOSPITAL_COMMUNITY): Payer: Self-pay

## 2019-07-26 DIAGNOSIS — Z3A32 32 weeks gestation of pregnancy: Secondary | ICD-10-CM | POA: Diagnosis not present

## 2019-07-26 DIAGNOSIS — O99213 Obesity complicating pregnancy, third trimester: Secondary | ICD-10-CM

## 2019-07-26 DIAGNOSIS — O10919 Unspecified pre-existing hypertension complicating pregnancy, unspecified trimester: Secondary | ICD-10-CM | POA: Insufficient documentation

## 2019-07-26 DIAGNOSIS — Z362 Encounter for other antenatal screening follow-up: Secondary | ICD-10-CM | POA: Diagnosis not present

## 2019-07-26 DIAGNOSIS — O0990 Supervision of high risk pregnancy, unspecified, unspecified trimester: Secondary | ICD-10-CM | POA: Insufficient documentation

## 2019-07-26 DIAGNOSIS — O10013 Pre-existing essential hypertension complicating pregnancy, third trimester: Secondary | ICD-10-CM | POA: Diagnosis not present

## 2019-07-27 ENCOUNTER — Encounter (HOSPITAL_COMMUNITY): Payer: Self-pay

## 2019-07-28 ENCOUNTER — Other Ambulatory Visit: Payer: Self-pay

## 2019-07-28 ENCOUNTER — Telehealth (INDEPENDENT_AMBULATORY_CARE_PROVIDER_SITE_OTHER): Payer: Medicaid Other | Admitting: Obstetrics and Gynecology

## 2019-07-28 ENCOUNTER — Encounter: Payer: Self-pay | Admitting: Obstetrics and Gynecology

## 2019-07-28 VITALS — BP 136/68 | HR 101

## 2019-07-28 DIAGNOSIS — O99513 Diseases of the respiratory system complicating pregnancy, third trimester: Secondary | ICD-10-CM

## 2019-07-28 DIAGNOSIS — D573 Sickle-cell trait: Secondary | ICD-10-CM

## 2019-07-28 DIAGNOSIS — O0990 Supervision of high risk pregnancy, unspecified, unspecified trimester: Secondary | ICD-10-CM

## 2019-07-28 DIAGNOSIS — O0993 Supervision of high risk pregnancy, unspecified, third trimester: Secondary | ICD-10-CM

## 2019-07-28 DIAGNOSIS — E0590 Thyrotoxicosis, unspecified without thyrotoxic crisis or storm: Secondary | ICD-10-CM

## 2019-07-28 DIAGNOSIS — J45909 Unspecified asthma, uncomplicated: Secondary | ICD-10-CM

## 2019-07-28 DIAGNOSIS — O10913 Unspecified pre-existing hypertension complicating pregnancy, third trimester: Secondary | ICD-10-CM

## 2019-07-28 DIAGNOSIS — O99283 Endocrine, nutritional and metabolic diseases complicating pregnancy, third trimester: Secondary | ICD-10-CM

## 2019-07-28 DIAGNOSIS — Z3A32 32 weeks gestation of pregnancy: Secondary | ICD-10-CM

## 2019-07-28 DIAGNOSIS — O10919 Unspecified pre-existing hypertension complicating pregnancy, unspecified trimester: Secondary | ICD-10-CM

## 2019-07-28 DIAGNOSIS — O99511 Diseases of the respiratory system complicating pregnancy, first trimester: Secondary | ICD-10-CM

## 2019-07-28 MED ORDER — ALBUTEROL SULFATE HFA 108 (90 BASE) MCG/ACT IN AERS: 1.0000 | INHALATION_SPRAY | Freq: Four times a day (QID) | RESPIRATORY_TRACT | 1 refills | Status: DC | PRN

## 2019-07-28 NOTE — Progress Notes (Signed)
   TELEHEALTH OBSTETRICS PRENATAL VIRTUAL VIDEO VISIT ENCOUNTER NOTE  Provider location: Center for [facility] at [place]   I connected with Kathleen [name] on 07/28/19 at  8:35 AM EST by MyChart Video Encounter at home and verified that I am speaking with the correct person using two identifiers.   I discussed the limitations, risks, security and privacy concerns of performing an evaluation and management service virtually and the availability of in person appointments. I also discussed with the patient that there may be a patient responsible charge related to this service. The patient expressed understanding and agreed to proceed. Subjective:  Kathleen [name] is a 29 y.o. G3P0020 at [redacted]w[redacted]d being seen today for ongoing prenatal care.  She is currently monitored for the following issues for this high-risk pregnancy and has Supervision of high risk pregnancy, antepartum; Chronic hypertension affecting pregnancy; Asthma affecting pregnancy in first trimester; Sickle cell trait (HCC); and Hyperthyroidism affecting pregnancy on their problem list.  Patient reports no complaints.  Contractions: Not present. Vag. Bleeding: None.  Movement: Present. Denies any leaking of fluid.   The following portions of the patient's history were reviewed and updated as appropriate: allergies, current medications, past family history, past medical history, past social history, past surgical history and problem list.   Objective:   Vitals:   07/28/19 0841  BP: 136/68  Pulse: (!) 101   Fetal Status:     Movement: Present     General:  Alert, oriented and cooperative. Patient is in no acute distress.  Respiratory: Normal respiratory effort, no problems with respiration noted  Mental Status: Normal mood and affect. Normal behavior. Normal judgment and thought content.  Rest of physical exam deferred due to type of encounter  Imaging:  Assessment and Plan:  Pregnancy: G3P0020 at [redacted]w[redacted]d  1. Supervision of  high risk pregnancy, antepartum  2. Chronic hypertension affecting pregnancy - Cont labetalol 300 mg BID - Cont baby ASA - weekly BPP starting 32 weeks  3. Asthma affecting pregnancy in first trimester - using inhaler occasionally, refill sent - to call if she is needing it daily  4. Hyperthyroidism affecting pregnancy in third trimester - Sees endo at [facility], had blood draw last week - Had thyroid US that patient reports was normal  5. Sickle cell trait (HCC)  Preterm labor symptoms and general obstetric precautions including but not limited to vaginal bleeding, contractions, leaking of fluid and fetal movement were reviewed in detail with the patient. I discussed the assessment and treatment plan with the patient. The patient was provided an opportunity to ask questions and all were answered. The patient agreed with the plan and demonstrated an understanding of the instructions. The patient was advised to call back or seek an in-person office evaluation/go to MAU at [facility] for any urgent or concerning symptoms. Please refer to After Visit Summary for other counseling recommendations.   I provided 20 minutes of face-to-face time during this encounter.  Return in about 2 weeks (around 08/11/2019) for virtual, high OB.  Future Appointments  Date Time Provider Department Center  08/02/2019  9:40 AM WH-MFC NURSE WH-MFC MFC-US  08/02/2019  9:45 AM WH-MFC [place] 2 WH-MFCUS MFC-US  08/09/2019  9:40 AM WH-MFC NURSE WH-MFC MFC-US  08/09/2019  9:45 AM WH-MFC [place] 2 WH-MFCUS MFC-US    Conan Bowens, MD Center for [facility], [facility]

## 2019-07-28 NOTE — Progress Notes (Addendum)
I connected with  Kathleen [name] on 07/28/19 at [phone] by MyChart and verified that I am speaking with the correct person using two identifiers.   I discussed the limitations, risks, security and privacy concerns of performing an evaluation and management service by telephone and the availability of in person appointments. I also discussed with the patient that there may be a patient responsible charge related to this service. The patient expressed understanding and agreed to proceed.  Marjo Bicker, RN 07/28/2019  8:41 AM

## 2019-08-02 ENCOUNTER — Other Ambulatory Visit (HOSPITAL_COMMUNITY): Payer: Self-pay | Admitting: *Deleted

## 2019-08-02 ENCOUNTER — Ambulatory Visit (HOSPITAL_COMMUNITY)
Admission: RE | Admit: 2019-08-02 | Discharge: 2019-08-02 | Disposition: A | Discharge status: Home / Self Care | Payer: Medicaid Other | Source: Ambulatory Visit | Attending: Obstetrics and Gynecology | Admitting: Obstetrics and Gynecology

## 2019-08-02 ENCOUNTER — Ambulatory Visit (HOSPITAL_COMMUNITY): Payer: Medicaid Other | Admitting: *Deleted

## 2019-08-02 ENCOUNTER — Other Ambulatory Visit: Payer: Self-pay

## 2019-08-02 DIAGNOSIS — O99891 Other specified diseases and conditions complicating pregnancy: Secondary | ICD-10-CM | POA: Diagnosis not present

## 2019-08-02 DIAGNOSIS — E0590 Thyrotoxicosis, unspecified without thyrotoxic crisis or storm: Secondary | ICD-10-CM | POA: Diagnosis not present

## 2019-08-02 DIAGNOSIS — O0990 Supervision of high risk pregnancy, unspecified, unspecified trimester: Secondary | ICD-10-CM | POA: Insufficient documentation

## 2019-08-02 DIAGNOSIS — O10919 Unspecified pre-existing hypertension complicating pregnancy, unspecified trimester: Secondary | ICD-10-CM | POA: Diagnosis present

## 2019-08-02 DIAGNOSIS — O99283 Endocrine, nutritional and metabolic diseases complicating pregnancy, third trimester: Secondary | ICD-10-CM | POA: Diagnosis not present

## 2019-08-02 DIAGNOSIS — O10913 Unspecified pre-existing hypertension complicating pregnancy, third trimester: Secondary | ICD-10-CM

## 2019-08-02 DIAGNOSIS — Z3A33 33 weeks gestation of pregnancy: Secondary | ICD-10-CM

## 2019-08-02 DIAGNOSIS — O99213 Obesity complicating pregnancy, third trimester: Secondary | ICD-10-CM | POA: Diagnosis not present

## 2019-08-02 DIAGNOSIS — J45909 Unspecified asthma, uncomplicated: Secondary | ICD-10-CM

## 2019-08-04 ENCOUNTER — Other Ambulatory Visit: Payer: Self-pay | Admitting: Lactation Services

## 2019-08-04 MED ORDER — LABETALOL HCL 100 MG PO TABS: 300.0000 mg | ORAL_TABLET | Freq: Two times a day (BID) | ORAL | 2 refills | Status: DC

## 2019-08-09 ENCOUNTER — Other Ambulatory Visit (HOSPITAL_COMMUNITY): Payer: Self-pay | Admitting: *Deleted

## 2019-08-09 ENCOUNTER — Encounter (HOSPITAL_COMMUNITY): Payer: Self-pay

## 2019-08-09 ENCOUNTER — Other Ambulatory Visit: Payer: Self-pay

## 2019-08-09 ENCOUNTER — Ambulatory Visit (HOSPITAL_COMMUNITY): Payer: Medicaid Other | Admitting: *Deleted

## 2019-08-09 ENCOUNTER — Ambulatory Visit (HOSPITAL_COMMUNITY)
Admission: RE | Admit: 2019-08-09 | Discharge: 2019-08-09 | Disposition: A | Discharge status: Home / Self Care | Payer: Medicaid Other | Source: Ambulatory Visit | Attending: Obstetrics and Gynecology | Admitting: Obstetrics and Gynecology

## 2019-08-09 DIAGNOSIS — O10919 Unspecified pre-existing hypertension complicating pregnancy, unspecified trimester: Secondary | ICD-10-CM | POA: Diagnosis not present

## 2019-08-09 DIAGNOSIS — Z3A24 24 weeks gestation of pregnancy: Secondary | ICD-10-CM | POA: Diagnosis not present

## 2019-08-09 DIAGNOSIS — O10012 Pre-existing essential hypertension complicating pregnancy, second trimester: Secondary | ICD-10-CM

## 2019-08-09 DIAGNOSIS — O0990 Supervision of high risk pregnancy, unspecified, unspecified trimester: Secondary | ICD-10-CM | POA: Diagnosis present

## 2019-08-15 ENCOUNTER — Telehealth (INDEPENDENT_AMBULATORY_CARE_PROVIDER_SITE_OTHER): Payer: Medicaid Other | Admitting: Obstetrics & Gynecology

## 2019-08-15 VITALS — BP 91/70 | HR 113

## 2019-08-15 DIAGNOSIS — O10913 Unspecified pre-existing hypertension complicating pregnancy, third trimester: Secondary | ICD-10-CM

## 2019-08-15 DIAGNOSIS — O99283 Endocrine, nutritional and metabolic diseases complicating pregnancy, third trimester: Secondary | ICD-10-CM | POA: Diagnosis not present

## 2019-08-15 DIAGNOSIS — E0590 Thyrotoxicosis, unspecified without thyrotoxic crisis or storm: Secondary | ICD-10-CM

## 2019-08-15 DIAGNOSIS — Z3A35 35 weeks gestation of pregnancy: Secondary | ICD-10-CM | POA: Diagnosis not present

## 2019-08-15 DIAGNOSIS — O0990 Supervision of high risk pregnancy, unspecified, unspecified trimester: Secondary | ICD-10-CM

## 2019-08-15 DIAGNOSIS — O10919 Unspecified pre-existing hypertension complicating pregnancy, unspecified trimester: Secondary | ICD-10-CM

## 2019-08-15 NOTE — Patient Instructions (Signed)

## 2019-08-15 NOTE — Progress Notes (Signed)
   TELEHEALTH VIRTUAL OBSTETRICS VISIT ENCOUNTER NOTE  I connected with [name] on 08/15/19 at  2:55 PM EST by telephone at home and verified that I am speaking with the correct person using two identifiers.   I discussed the limitations, risks, security and privacy concerns of performing an evaluation and management service by telephone and the availability of in person appointments. I also discussed with the patient that there may be a patient responsible charge related to this service. The patient expressed understanding and agreed to proceed.  Subjective:  Kathleen [name] is a 29 y.o. G3P0020 at [redacted]w[redacted]d being followed for ongoing prenatal care.  She is currently monitored for the following issues for this high-risk pregnancy and has Supervision of high risk pregnancy, antepartum; Chronic hypertension affecting pregnancy; Asthma affecting pregnancy in first trimester; Sickle cell trait (HCC); and Hyperthyroidism affecting pregnancy on their problem list.  Patient reports occasional contractions. Reports fetal movement. Denies any contractions, bleeding or leaking of fluid.   The following portions of the patient's history were reviewed and updated as appropriate: allergies, current medications, past family history, past medical history, past social history, past surgical history and problem list.   Objective:   General:  Alert, oriented and cooperative.   Mental Status: Normal mood and affect perceived. Normal judgment and thought content.  Rest of physical exam deferred due to type of encounter  Assessment and Plan:  Pregnancy: G3P0020 at [redacted]w[redacted]d 1. Supervision of high risk pregnancy, antepartum Has weekly BPP  2. Hyperthyroidism affecting pregnancy in third trimester Started Methimazole recently  3. Chronic hypertension affecting pregnancy BP good control  Preterm labor symptoms and general obstetric precautions including but not limited to vaginal bleeding, contractions,  leaking of fluid and fetal movement were reviewed in detail with the patient.  I discussed the assessment and treatment plan with the patient. The patient was provided an opportunity to ask questions and all were answered. The patient agreed with the plan and demonstrated an understanding of the instructions. The patient was advised to call back or seek an in-person office evaluation/go to MAU at [facility] for any urgent or concerning symptoms. Please refer to After Visit Summary for other counseling recommendations.   I provided 11 minutes of non-face-to-face time during this encounter.  Return in about 1 week (around 08/22/2019) for GBS.  Future Appointments  Date Time Provider Department Center  08/16/2019 11:00 AM WH-MFC NURSE WH-MFC MFC-US  08/16/2019 11:00 AM WH-MFC [place] 3 WH-MFCUS MFC-US  08/23/2019 10:45 AM WH-MFC NURSE WH-MFC MFC-US  08/23/2019 10:45 AM WH-MFC [place] 2 WH-MFCUS MFC-US  08/30/2019 10:00 AM WH-MFC NURSE WH-MFC MFC-US  08/30/2019 10:00 AM WH-MFC [place] 3 WH-MFCUS MFC-US    Scheryl Darter, MD Center for [employer], [facility]

## 2019-08-16 ENCOUNTER — Ambulatory Visit (HOSPITAL_COMMUNITY)
Admission: RE | Admit: 2019-08-16 | Discharge: 2019-08-16 | Disposition: A | Discharge status: Home / Self Care | Payer: Medicaid Other | Source: Ambulatory Visit | Attending: Obstetrics and Gynecology | Admitting: Obstetrics and Gynecology

## 2019-08-16 ENCOUNTER — Ambulatory Visit (HOSPITAL_COMMUNITY): Payer: Medicaid Other | Admitting: *Deleted

## 2019-08-16 ENCOUNTER — Encounter (HOSPITAL_COMMUNITY): Payer: Self-pay | Admitting: *Deleted

## 2019-08-16 ENCOUNTER — Other Ambulatory Visit: Payer: Self-pay

## 2019-08-16 DIAGNOSIS — Z3A35 35 weeks gestation of pregnancy: Secondary | ICD-10-CM | POA: Diagnosis not present

## 2019-08-16 DIAGNOSIS — O10913 Unspecified pre-existing hypertension complicating pregnancy, third trimester: Secondary | ICD-10-CM | POA: Insufficient documentation

## 2019-08-16 DIAGNOSIS — O0990 Supervision of high risk pregnancy, unspecified, unspecified trimester: Secondary | ICD-10-CM | POA: Diagnosis present

## 2019-08-16 DIAGNOSIS — O10013 Pre-existing essential hypertension complicating pregnancy, third trimester: Secondary | ICD-10-CM | POA: Diagnosis not present

## 2019-08-23 ENCOUNTER — Ambulatory Visit (HOSPITAL_COMMUNITY): Payer: Medicaid Other | Admitting: *Deleted

## 2019-08-23 ENCOUNTER — Ambulatory Visit (INDEPENDENT_AMBULATORY_CARE_PROVIDER_SITE_OTHER): Payer: Medicaid Other | Admitting: Obstetrics and Gynecology

## 2019-08-23 ENCOUNTER — Other Ambulatory Visit (HOSPITAL_COMMUNITY)
Admission: RE | Admit: 2019-08-23 | Discharge: 2019-08-23 | Disposition: A | Discharge status: Home / Self Care | Payer: Medicaid Other | Source: Ambulatory Visit | Attending: Obstetrics and Gynecology | Admitting: Obstetrics and Gynecology

## 2019-08-23 ENCOUNTER — Other Ambulatory Visit: Payer: Self-pay

## 2019-08-23 ENCOUNTER — Encounter: Payer: Self-pay | Admitting: Obstetrics and Gynecology

## 2019-08-23 ENCOUNTER — Ambulatory Visit (HOSPITAL_COMMUNITY)
Admission: RE | Admit: 2019-08-23 | Discharge: 2019-08-23 | Disposition: A | Discharge status: Home / Self Care | Payer: Medicaid Other | Source: Ambulatory Visit | Attending: Obstetrics and Gynecology | Admitting: Obstetrics and Gynecology

## 2019-08-23 VITALS — BP 128/80 | HR 111 | Wt 251.8 lb

## 2019-08-23 DIAGNOSIS — O99213 Obesity complicating pregnancy, third trimester: Secondary | ICD-10-CM

## 2019-08-23 DIAGNOSIS — O10913 Unspecified pre-existing hypertension complicating pregnancy, third trimester: Secondary | ICD-10-CM | POA: Diagnosis present

## 2019-08-23 DIAGNOSIS — O0990 Supervision of high risk pregnancy, unspecified, unspecified trimester: Secondary | ICD-10-CM

## 2019-08-23 DIAGNOSIS — O99283 Endocrine, nutritional and metabolic diseases complicating pregnancy, third trimester: Secondary | ICD-10-CM

## 2019-08-23 DIAGNOSIS — J45909 Unspecified asthma, uncomplicated: Secondary | ICD-10-CM

## 2019-08-23 DIAGNOSIS — Z3A36 36 weeks gestation of pregnancy: Secondary | ICD-10-CM

## 2019-08-23 DIAGNOSIS — Z362 Encounter for other antenatal screening follow-up: Secondary | ICD-10-CM | POA: Diagnosis not present

## 2019-08-23 DIAGNOSIS — O10919 Unspecified pre-existing hypertension complicating pregnancy, unspecified trimester: Secondary | ICD-10-CM

## 2019-08-23 DIAGNOSIS — E0590 Thyrotoxicosis, unspecified without thyrotoxic crisis or storm: Secondary | ICD-10-CM

## 2019-08-23 DIAGNOSIS — O99511 Diseases of the respiratory system complicating pregnancy, first trimester: Secondary | ICD-10-CM

## 2019-08-23 DIAGNOSIS — O99513 Diseases of the respiratory system complicating pregnancy, third trimester: Secondary | ICD-10-CM

## 2019-08-23 LAB — POCT URINALYSIS DIP (DEVICE)
Bilirubin Urine: NEGATIVE
Glucose, UA: NEGATIVE mg/dL
Hgb urine dipstick: NEGATIVE
Ketones, ur: NEGATIVE mg/dL
Leukocytes,Ua: NEGATIVE
Nitrite: NEGATIVE
Protein, ur: NEGATIVE mg/dL
Specific Gravity, Urine: 1.015 (ref 1.005–1.030)
Urobilinogen, UA: 0.2 mg/dL (ref 0.0–1.0)
pH: 6 (ref 5.0–8.0)

## 2019-08-23 NOTE — Progress Notes (Signed)
   PRENATAL VISIT NOTE  Subjective:  Kathleen [name] is a 29 y.o. G3P0020 at [redacted]w[redacted]d being seen today for ongoing prenatal care.  She is currently monitored for the following issues for this high-risk pregnancy and has Supervision of high risk pregnancy, antepartum; Chronic hypertension affecting pregnancy; Asthma affecting pregnancy in first trimester; Sickle cell trait (HCC); and Hyperthyroidism affecting pregnancy on their problem list.  Patient reports no complaints.  Contractions: Irritability. Vag. Bleeding: None.  Movement: Present. Denies leaking of fluid.   The following portions of the patient's history were reviewed and updated as appropriate: allergies, current medications, past family history, past medical history, past social history, past surgical history and problem list.   Objective:   Vitals:   08/23/19 0837  BP: 128/80  Pulse: (!) 111  Weight: 251 lb 12.8 oz (114.2 kg)    Fetal Status: Fetal Heart Rate (bpm): 158 Fundal Height: 37 cm Movement: Present  Presentation: Vertex  General:  Alert, oriented and cooperative. Patient is in no acute distress.  Skin: Skin is warm and dry. No rash noted.   Cardiovascular: Normal heart rate noted  Respiratory: Normal respiratory effort, no problems with respiration noted  Abdomen: Soft, gravid, appropriate for gestational age.  Pain/Pressure: Absent     Pelvic: Cervical exam performed Dilation: Closed Effacement (%): Thick Station: Ballotable  Extremities: Normal range of motion.  Edema: None  Mental Status: Normal mood and affect. Normal behavior. Normal judgment and thought content.   Assessment and Plan:  Pregnancy: G3P0020 at [redacted]w[redacted]d 1. Supervision of high risk pregnancy, antepartum Patient is doing well without complaints Cultures today Discussed expectations of labor and pain management options  2. Chronic hypertension affecting pregnancy Continue labetalol and ASA Continue weekly BPP  3. Asthma affecting pregnancy in  first trimester   4. Hyperthyroidism affecting pregnancy in third trimester Continue methimazole Seeing endocrinologist at [facility]  Preterm labor symptoms and general obstetric precautions including but not limited to vaginal bleeding, contractions, leaking of fluid and fetal movement were reviewed in detail with the patient. Please refer to After Visit Summary for other counseling recommendations.   Return in about 1 week (around 08/30/2019) for Virtual, ROB, High risk.  Future Appointments  Date Time Provider Department Center  08/23/2019 10:45 AM WH-MFC NURSE WH-MFC MFC-US  08/23/2019 10:45 AM WH-MFC [place] 2 WH-MFCUS MFC-US  08/29/2019 11:15 AM [place] Bing, MD WOC-WOCA WOC  08/30/2019 10:00 AM WH-MFC NURSE WH-MFC MFC-US  08/30/2019 10:00 AM WH-MFC [place] 3 WH-MFCUS MFC-US  09/05/2019 11:15 AM Hermina Staggers, MD WOC-WOCA WOC  09/12/2019 11:15 AM Levie Heritage, DO WOC-WOCA WOC    Catalina Antigua, MD

## 2019-08-24 LAB — CERVICOVAGINAL ANCILLARY ONLY
Chlamydia: NEGATIVE
Comment: NEGATIVE
Comment: NORMAL
Neisseria Gonorrhea: NEGATIVE

## 2019-08-26 ENCOUNTER — Encounter: Payer: Self-pay | Admitting: Obstetrics and Gynecology

## 2019-08-26 DIAGNOSIS — O99820 Streptococcus B carrier state complicating pregnancy: Secondary | ICD-10-CM | POA: Insufficient documentation

## 2019-08-26 LAB — CULTURE, BETA STREP (GROUP B ONLY): Strep Gp B Culture: POSITIVE — AB

## 2019-08-29 ENCOUNTER — Telehealth (INDEPENDENT_AMBULATORY_CARE_PROVIDER_SITE_OTHER): Payer: Medicaid Other | Admitting: Obstetrics and Gynecology

## 2019-08-29 VITALS — BP 105/83 | HR 101

## 2019-08-29 DIAGNOSIS — O99820 Streptococcus B carrier state complicating pregnancy: Secondary | ICD-10-CM

## 2019-08-29 DIAGNOSIS — Z6837 Body mass index (BMI) 37.0-37.9, adult: Secondary | ICD-10-CM

## 2019-08-29 DIAGNOSIS — O10919 Unspecified pre-existing hypertension complicating pregnancy, unspecified trimester: Secondary | ICD-10-CM | POA: Diagnosis not present

## 2019-08-29 DIAGNOSIS — E0590 Thyrotoxicosis, unspecified without thyrotoxic crisis or storm: Secondary | ICD-10-CM

## 2019-08-29 DIAGNOSIS — O0993 Supervision of high risk pregnancy, unspecified, third trimester: Secondary | ICD-10-CM

## 2019-08-29 DIAGNOSIS — O99283 Endocrine, nutritional and metabolic diseases complicating pregnancy, third trimester: Secondary | ICD-10-CM | POA: Diagnosis not present

## 2019-08-29 DIAGNOSIS — O0990 Supervision of high risk pregnancy, unspecified, unspecified trimester: Secondary | ICD-10-CM

## 2019-08-29 NOTE — Progress Notes (Signed)
   TELEHEALTH VIRTUAL OBSTETRICS VISIT ENCOUNTER NOTE  Clinic: [facility]  I connected with [name] on 08/29/19 at 11:15 AM EST by telephone at home and verified that I am speaking with the correct person using two identifiers.   I discussed the limitations, risks, security and privacy concerns of performing an evaluation and management service by telephone and the availability of in person appointments. I also discussed with the patient that there may be a patient responsible charge related to this service. The patient expressed understanding and agreed to proceed.  Subjective:  Kathleen [name] is a 29 y.o. G3P0020 at [redacted]w[redacted]d being followed for ongoing prenatal care.  She is currently monitored for the following issues for this high-risk pregnancy and has Supervision of high risk pregnancy, antepartum; Chronic hypertension affecting pregnancy; Asthma affecting pregnancy in first trimester; Sickle cell trait (HCC); Hyperthyroidism affecting pregnancy; and GBS (group B Streptococcus carrier), +RV culture, currently pregnant on their problem list.  Patient reports no complaints. Reports fetal movement. Denies any contractions, bleeding or leaking of fluid.   The following portions of the patient's history were reviewed and updated as appropriate: allergies, current medications, past family history, past medical history, past social history, past surgical history and problem list.   Objective:   Vitals:   08/29/19 1113  BP: 105/83  Pulse: (!) 101    Babyscripts Data Reviewed: yes  General:  Alert, oriented and cooperative.   Mental Status: Normal mood and affect perceived. Normal judgment and thought content.  Rest of physical exam deferred due to type of encounter  Assessment and Plan:  Pregnancy: G3P0020 at [redacted]w[redacted]d 1. GBS (group B Streptococcus carrier), +RV culture, currently pregnant D/w her   2. Supervision of high risk pregnancy, antepartum Routine  care qwk bpp continues tomorrow Set up IOL at 39wks today  3. Chronic hypertension affecting pregnancy Continue labetalol, asa  4. Hyperthyroidism affecting pregnancy in third trimester Continue mmz  Preterm labor symptoms and general obstetric precautions including but not limited to vaginal bleeding, contractions, leaking of fluid and fetal movement were reviewed in detail with the patient.  I discussed the assessment and treatment plan with the patient. The patient was provided an opportunity to ask questions and all were answered. The patient agreed with the plan and demonstrated an understanding of the instructions. The patient was advised to call back or seek an in-person office evaluation/go to MAU at [facility] for any urgent or concerning symptoms. Please refer to After Visit Summary for other counseling recommendations.   I provided 7 minutes of non-face-to-face time during this encounter. The visit was conducted via MyChart-medicine  No follow-ups on file.  Future Appointments  Date Time Provider Department Center  08/30/2019 10:00 AM WH-MFC NURSE WH-MFC MFC-US  08/30/2019 10:00 AM WH-MFC [place] 3 WH-MFCUS MFC-US  09/05/2019 11:15 AM Hermina Staggers, MD WOC-WOCA WOC  09/11/2019 12:00 AM MC-LD SCHED ROOM MC-INDC None  09/12/2019 11:15 AM Levie Heritage, DO WOC-WOCA WOC    [place] Bing, MD Center for [facility], [facility]

## 2019-08-30 ENCOUNTER — Encounter (HOSPITAL_COMMUNITY): Payer: Self-pay

## 2019-08-30 ENCOUNTER — Other Ambulatory Visit: Payer: Self-pay

## 2019-08-30 ENCOUNTER — Ambulatory Visit (HOSPITAL_COMMUNITY)
Admission: RE | Admit: 2019-08-30 | Discharge: 2019-08-30 | Disposition: A | Discharge status: Home / Self Care | Payer: Medicaid Other | Source: Ambulatory Visit | Attending: Obstetrics and Gynecology | Admitting: Obstetrics and Gynecology

## 2019-08-30 ENCOUNTER — Other Ambulatory Visit (HOSPITAL_COMMUNITY): Payer: Self-pay | Admitting: *Deleted

## 2019-08-30 ENCOUNTER — Ambulatory Visit (HOSPITAL_COMMUNITY): Payer: Medicaid Other | Admitting: *Deleted

## 2019-08-30 DIAGNOSIS — O10013 Pre-existing essential hypertension complicating pregnancy, third trimester: Secondary | ICD-10-CM

## 2019-08-30 DIAGNOSIS — O99820 Streptococcus B carrier state complicating pregnancy: Secondary | ICD-10-CM

## 2019-08-30 DIAGNOSIS — O99213 Obesity complicating pregnancy, third trimester: Secondary | ICD-10-CM | POA: Diagnosis not present

## 2019-08-30 DIAGNOSIS — O10913 Unspecified pre-existing hypertension complicating pregnancy, third trimester: Secondary | ICD-10-CM | POA: Diagnosis present

## 2019-08-30 DIAGNOSIS — Z3A37 37 weeks gestation of pregnancy: Secondary | ICD-10-CM

## 2019-08-30 DIAGNOSIS — O0990 Supervision of high risk pregnancy, unspecified, unspecified trimester: Secondary | ICD-10-CM | POA: Diagnosis present

## 2019-09-01 ENCOUNTER — Encounter (HOSPITAL_COMMUNITY): Payer: Self-pay | Admitting: *Deleted

## 2019-09-01 ENCOUNTER — Telehealth (HOSPITAL_COMMUNITY): Payer: Self-pay | Admitting: *Deleted

## 2019-09-01 NOTE — Telephone Encounter (Signed)
Preadmission screen  

## 2019-09-05 ENCOUNTER — Encounter: Payer: Self-pay | Admitting: Obstetrics and Gynecology

## 2019-09-05 ENCOUNTER — Telehealth (INDEPENDENT_AMBULATORY_CARE_PROVIDER_SITE_OTHER): Payer: Medicaid Other | Admitting: Obstetrics and Gynecology

## 2019-09-05 ENCOUNTER — Other Ambulatory Visit: Payer: Self-pay

## 2019-09-05 VITALS — BP 106/57 | HR 96

## 2019-09-05 DIAGNOSIS — O99820 Streptococcus B carrier state complicating pregnancy: Secondary | ICD-10-CM

## 2019-09-05 DIAGNOSIS — E0590 Thyrotoxicosis, unspecified without thyrotoxic crisis or storm: Secondary | ICD-10-CM

## 2019-09-05 DIAGNOSIS — O0990 Supervision of high risk pregnancy, unspecified, unspecified trimester: Secondary | ICD-10-CM

## 2019-09-05 DIAGNOSIS — O10913 Unspecified pre-existing hypertension complicating pregnancy, third trimester: Secondary | ICD-10-CM

## 2019-09-05 DIAGNOSIS — O99283 Endocrine, nutritional and metabolic diseases complicating pregnancy, third trimester: Secondary | ICD-10-CM

## 2019-09-05 DIAGNOSIS — O10919 Unspecified pre-existing hypertension complicating pregnancy, unspecified trimester: Secondary | ICD-10-CM

## 2019-09-05 DIAGNOSIS — Z3A38 38 weeks gestation of pregnancy: Secondary | ICD-10-CM

## 2019-09-05 NOTE — Progress Notes (Signed)
I connected with  Kathleen [name] on 09/05/19 at 11:15 AM EDT by telephone and verified that I am speaking with the correct person using two identifiers.   I discussed the limitations, risks, security and privacy concerns of performing an evaluation and management service by telephone and the availability of in person appointments. I also discussed with the patient that there may be a patient responsible charge related to this service. The patient expressed understanding and agreed to proceed.  Kathleen [name], CMA 09/05/2019  11:36 AM

## 2019-09-05 NOTE — Progress Notes (Signed)
TELEHEALTH OBSTETRICS PRENATAL VIRTUAL VIDEO VISIT ENCOUNTER NOTE  Provider location: [facility] [place]   I connected with [name] on 09/05/19 at 11:15 AM EDT by MyChart Video Encounter at home and verified that I am speaking with the correct person using two identifiers.   I discussed the limitations, risks, security and privacy concerns of performing an evaluation and management service virtually and the availability of in person appointments. I also discussed with the patient that there may be a patient responsible charge related to this service. The patient expressed understanding and agreed to proceed. Subjective:  Destane Speas is a 29 y.o. G3P0020 at [redacted]w[redacted]d being seen today for ongoing prenatal care.  She is currently monitored for the following issues for this high-risk pregnancy and has Supervision of high risk pregnancy, antepartum; Chronic hypertension affecting pregnancy; Asthma affecting pregnancy in first trimester; Sickle cell trait (HCC); Hyperthyroidism affecting pregnancy; and GBS (group B Streptococcus carrier), +RV culture, currently pregnant on their problem list.  Patient reports general discomforts of pregnancy.  Contractions: Irritability. Vag. Bleeding: None.  Movement: Present. Denies any leaking of fluid.   The following portions of the patient's history were reviewed and updated as appropriate: allergies, current medications, past family history, past medical history, past social history, past surgical history and problem list.   Objective:   Vitals:   09/05/19 1139  BP: (!) 106/57  Pulse: 96    Fetal Status:     Movement: Present     General:  Alert, oriented and cooperative. Patient is in no acute distress.  Respiratory: Normal respiratory effort, no problems with respiration noted  Mental Status: Normal mood and affect. Normal behavior. Normal judgment and thought content.  Rest of physical exam deferred due to type of  encounter  Imaging: [place] MFM FETAL BPP WO NON STRESS  Result Date: 08/30/2019 ----------------------------------------------------------------------  OBSTETRICS REPORT                       (Signed Final 08/30/2019 12:30 pm) ---------------------------------------------------------------------- Patient Info  ID #:       [id number]                          D.O.B.:  25-Apr-1991 (28 yrs)  Name:       [facility]                  Visit Date: 08/30/2019 09:59 am ---------------------------------------------------------------------- Performed By  Performed By:     Percell Boston          Ref. Address:     [address]                                                             [place], [place]  [address]  Attending:        Lin Landsman      [facility]                    MD                                       [facility]  Referred By:      Hermina Staggers                    MD ---------------------------------------------------------------------- Orders   #  Description                          Code         Ordered By   1  [place] MFM FETAL BPP WO NON              76819.01     RAVI [facility]      STRESS  ----------------------------------------------------------------------   #  Order #                    Accession #                 Episode #   1  [id number]                  [id number]                  [id number]  ---------------------------------------------------------------------- Indications   [redacted] weeks gestation of pregnancy                Z3A.37   Hypertension - Chronic/Pre-existing            O10.019   (labetalol)   History of sickle cell trait (abnormal Natera -Z86.2   carrier)   Obesity complicating pregnancy, second         O99.212   trimester (BMI 34)   Hyperthyroid                                   O99.280 E05.90   Asthma - (albuterol only)                       O99.89 j45.909   Low Risk NIPS  ---------------------------------------------------------------------- Vital Signs                                                 Height:        5'7" ---------------------------------------------------------------------- Fetal Evaluation  Num Of Fetuses:         1  Fetal Heart Rate(bpm):  151  Cardiac Activity:       Observed  Presentation:           Cephalic  Amniotic Fluid  AFI FV:      Within normal limits  AFI Sum(cm)     %Tile       Largest Pocket(cm)  18.24           70          7.42  RUQ(cm)       RLQ(cm)       LUQ(cm)        LLQ(cm)  0             7.42          4.05           6.77 ---------------------------------------------------------------------- Biophysical Evaluation  Amniotic F.V:   Within normal limits       F. Tone:        Observed  F. Movement:    Observed                   Score:          8/8  F. Breathing:   Observed ---------------------------------------------------------------------- OB History  Gravidity:    3         Term:   0 ---------------------------------------------------------------------- Gestational Age  Best:          37w 1d     Det. ByMarcella Dubs         EDD:   09/19/19                                      (02/17/19) ---------------------------------------------------------------------- Anatomy  Thoracic:              Appears normal         Bladder:                Appears normal  Stomach:               Appears normal, left                         sided ---------------------------------------------------------------------- Cervix Uterus Adnexa  Cervix  Not visualized (advanced GA >24wks) ---------------------------------------------------------------------- Impression  Follow up antenatal testing due to maternal hypertension  Biophysical profile 8/8  Chronic hypertension  Known hyperthyroidism  Low risk NIPS  Ms. Frymire shared that she is scheduled or IOL of 09/11/19  ---------------------------------------------------------------------- Recommendations  Follow up BPP scheduled in 1 week ----------------------------------------------------------------------               Lin Landsman, MD Electronically Signed Final Report   08/30/2019 12:30 pm ----------------------------------------------------------------------  [place] MFM FETAL BPP WO NON STRESS  Result Date: 08/23/2019 ----------------------------------------------------------------------  OBSTETRICS REPORT                       (Signed Final 08/23/2019 11:34 am) ---------------------------------------------------------------------- Patient Info  ID #:       [id number]                          D.O.B.:  [birth year] (28 yrs)  Name:       [facility]                  Visit Date: 08/23/2019 10:59 am ---------------------------------------------------------------------- Performed By  Performed By:     Lenise Arena,       Ref. Address:     [address]                    RDMS  [address]                                                             [place], [place]                                                             [address]  Attending:        Ma Rings MD         [facility]                                                             [facility]  Referred By:      Hermina Staggers                    MD ---------------------------------------------------------------------- Orders   #  Description                          Code         Ordered By   1  [place] MFM FETAL BPP WO NON              76819.01     RAVI [facility]      STRESS   2  [place] MFM OB FOLLOW UP                  76816.01     RAVI [facility]  ----------------------------------------------------------------------   #  Order #                    Accession #                 Episode #   1  [id number]                  [id number]                  [id number]   2  [id number]                  [id number]                   [id number]  ---------------------------------------------------------------------- Indications   Hypertension - Chronic/Pre-existing            O10.019   (labetalol)   History of sickle cell trait (abnormal Natera -Z86.2   carrier)   Obesity complicating pregnancy, second         O99.212   trimester (BMI 34)   Hyperthyroid                                   O99.280 E05.90   Asthma - (albuterol only)  O99.89 j45.909   Low Risk NIPS   [redacted] weeks gestation of pregnancy                Z3A.36  ---------------------------------------------------------------------- Vital Signs  Weight (lb): 251                               Height:        5'7"  BMI:         39.31 ---------------------------------------------------------------------- Fetal Evaluation  Num Of Fetuses:         1  Fetal Heart Rate(bpm):  155  Cardiac Activity:       Observed  Presentation:           Cephalic  Placenta:               Anterior  P. Cord Insertion:      Previously Visualized  Amniotic Fluid  AFI FV:      Within normal limits  AFI Sum(cm)     %Tile       Largest Pocket(cm)  21.49           81          7.99  RUQ(cm)       RLQ(cm)       LUQ(cm)        LLQ(cm)  5.62          2.44          5.44           7.99 ---------------------------------------------------------------------- Biophysical Evaluation  Amniotic F.V:   Within normal limits       F. Tone:        Observed  F. Movement:    Observed                   Score:          8/8  F. Breathing:   Observed ---------------------------------------------------------------------- Biometry  BPD:      90.8  mm     G. Age:  36w 6d         77  %    CI:        77.42   %    70 - 86                                                          FL/HC:      21.8   %    20.1 - 22.1  HC:      326.7  mm     G. Age:  37w 0d         42  %    HC/AC:      0.95        0.93 - 1.11  AC:      343.9  mm     G. Age:  38w 2d         97  %    FL/BPD:     78.4   %    71 - 87  FL:       71.2  mm     G. Age:   36w 3d         55  %    FL/AC:  20.7   %    20 - 24  LV:        4.9  mm  Est. FW:    3244  gm      7 lb 2 oz     87  % ---------------------------------------------------------------------- OB History  Gravidity:    3         Term:   0 ---------------------------------------------------------------------- Gestational Age  U/S Today:     37w 1d                                        EDD:   09/12/19  Best:          36w 1d     Det. ByMarcella Dubs         EDD:   09/19/19                                      (02/17/19) ---------------------------------------------------------------------- Anatomy  Cranium:               Appears normal         Aortic Arch:            Previously seen  Cavum:                 Appears normal         Ductal Arch:            Previously seen  Ventricles:            Appears normal         Diaphragm:              Appears normal  Choroid Plexus:        Previously seen        Stomach:                Appears normal, left                                                                        sided  Cerebellum:            Previously seen        Abdomen:                Appears normal  Posterior Fossa:       Previously seen        Abdominal Wall:         Previously seen  Nuchal Fold:           Previously seen        Cord Vessels:           Previously seen  Face:                  Orbits and profile     Kidneys:                Appear normal  previously seen  Lips:                  Previously seen        Bladder:                Appears normal  Thoracic:              Appears normal         Spine:                  Previously seen                         Previously seen  Heart:                 Appears normal         Upper Extremities:      Previously seen                         (4CH, axis, and                         situs)  RVOT:                  Previously seen        Lower Extremities:      Previously seen  LVOT:                  Previously seen  Other:  Female gender  Heels, 5th digit and Open hands previously          visualized. Technically difficult due to maternal habitus and fetal          position. ---------------------------------------------------------------------- Cervix Uterus Adnexa  Cervix  Not visualized (advanced GA >24wks) ---------------------------------------------------------------------- Comments  This patient was seen for a follow up growth scan due to a  history of chronic hypertension currently treated with labetalol  and history of hyperthyroidism.  She denies any problems  since her last exam.  She was informed that the fetal growth and amniotic fluid  level appears appropriate for her gestational age.  A biophysical profile performed today was 8 out of 8.  She will return next week for another biophysical profile. ----------------------------------------------------------------------                   Ma Rings, MD Electronically Signed Final Report   08/23/2019 11:34 am ----------------------------------------------------------------------  [place] MFM FETAL BPP WO NON STRESS  Result Date: 08/16/2019 ----------------------------------------------------------------------  OBSTETRICS REPORT                       (Signed Final 08/16/2019 11:44 am) ---------------------------------------------------------------------- Patient Info  ID #:       [id number]                          D.O.B.:  08-05-1990 (28 yrs)  Name:       [facility]                  Visit Date: 08/16/2019 11:30 am ---------------------------------------------------------------------- Performed By  Performed By:     Percell Boston          Ref. Address:     [address]                    RDMS  [address]                                                             [place], [place]                                                             [address]  Attending:        Ma Rings MD         [facility]  Referred By:      Hermina Staggers                    MD ---------------------------------------------------------------------- Orders   #  Description                          Code         Ordered By   1  [place] MFM FETAL BPP WO NON              76819.01     RAVI [facility]      STRESS  ----------------------------------------------------------------------   #  Order #                    Accession #                 Episode #   1  [id number]                  [id number]                  [id number]  ---------------------------------------------------------------------- Indications   Hypertension - Chronic/Pre-existing            O10.019   (labetalol)   [redacted] weeks gestation of pregnancy                Z3A.35   History of sickle cell trait (abnormal Natera -Z86.2   carrier)   Obesity complicating pregnancy, second         O99.212   trimester (BMI 34)   Hyperthyroid                                   O99.280 E05.90   Asthma - (albuterol only)                      O99.89 j45.909   Low Risk NIPS  ---------------------------------------------------------------------- Vital Signs  Height:        5'7" ---------------------------------------------------------------------- Fetal Evaluation  Num Of Fetuses:         1  Cardiac Activity:       Observed  Presentation:           Cephalic  Amniotic Fluid  AFI FV:      Subjectively increased  AFI Sum(cm)     %Tile       Largest Pocket(cm)  23.1            88          7.14  RUQ(cm)       RLQ(cm)       LUQ(cm)        LLQ(cm)  6.41          6.37          3.18           7.14 ---------------------------------------------------------------------- Biophysical Evaluation  Amniotic F.V:   Within normal limits       F. Tone:        Observed  F. Movement:    Observed                   Score:          8/8  F. Breathing:   Observed ---------------------------------------------------------------------- OB  History  Gravidity:    3         Term:   0 ---------------------------------------------------------------------- Gestational Age  Best:          35w 1d     Det. ByMarcella Dubs         EDD:   09/19/19                                      (02/17/19) ---------------------------------------------------------------------- Anatomy  Thoracic:              Appears normal         Spine:                  Appears normal  Stomach:               Appears normal, left                         sided ---------------------------------------------------------------------- Cervix Uterus Adnexa  Cervix  Not visualized (advanced GA >24wks) ---------------------------------------------------------------------- Comments  This patient was seen for a biophysical profile due to a history  of chronic hypertension currently treated with labetalol.  She  denies any problems since her last exam.  Her blood  pressure today was within normal limits (125/64).  A biophysical profile performed today was 8 out of 8.  There was normal amniotic fluid noted on today's ultrasound  exam.  Another biophysical profile was scheduled in 1 week. ----------------------------------------------------------------------                   Ma Rings, MD Electronically Signed Final Report   08/16/2019 11:44 am ----------------------------------------------------------------------  [place] MFM FETAL BPP WO NON STRESS  Result Date: 08/09/2019 ----------------------------------------------------------------------  OBSTETRICS REPORT                       (Signed Final 08/09/2019 03:08 pm) ---------------------------------------------------------------------- Patient Info  ID #:       [id number]  D.O.B.:  [birth year] (28 yrs)  Name:       [facility]                  Visit Date: 08/09/2019 09:46 am ---------------------------------------------------------------------- Performed By  Performed By:     Tomma Lightning             Ref. Address:     [address]                                                             [place], [place]                                                             [address]  Attending:        Lin Landsman      [facility]                    MD                                       Fetal Care  Referred By:      Hermina Staggers                    MD ---------------------------------------------------------------------- Orders   #  Description                          Code         Ordered By   1  [place] MFM FETAL BPP WO NON              60454.09     [facility]      STRESS                                            [facility]  ----------------------------------------------------------------------   #  Order #                    Accession #                 Episode #   1  [id number]                  [id number]  [id number]  ---------------------------------------------------------------------- Indications   Hypertension - Chronic/Pre-existing            O10.019   (labetalol)   [redacted] weeks gestation of pregnancy                Z3A.34   History of sickle cell trait (abnormal Natera -Z86.2   carrier)   Obesity complicating pregnancy, second         O99.212   trimester (BMI 34)   Hyperthyroid                                   O99.280 E05.90   Asthma - (albuterol only)                      O99.89 j45.909   Low Risk NIPS  ---------------------------------------------------------------------- Vital Signs                                                 Height:        5'7" ---------------------------------------------------------------------- Fetal Evaluation  Num Of Fetuses:         1  Fetal Heart Rate(bpm):  167  Cardiac Activity:       Observed  Presentation:           Cephalic  Placenta:               Anterior  P. Cord Insertion:      Previously Visualized  Amniotic Fluid  AFI FV:      Within normal limits  AFI Sum(cm)      %Tile       Largest Pocket(cm)  18.63           69          9.65  RUQ(cm)       RLQ(cm)       LUQ(cm)        LLQ(cm)  9.65          1.57          2.87           4.54 ---------------------------------------------------------------------- Biophysical Evaluation  Amniotic F.V:   Within normal limits       F. Tone:        Observed  F. Movement:    Observed                   Score:          8/8  F. Breathing:   Observed ---------------------------------------------------------------------- OB History  Gravidity:    3         Term:   0 ---------------------------------------------------------------------- Gestational Age  Best:          34w 1d     Det. ByMarcella Dubs         EDD:   09/19/19                                      (02/17/19) ---------------------------------------------------------------------- Cervix Uterus Adnexa  Cervix  Not visualized (advanced GA >24wks) ---------------------------------------------------------------------- Impression  Known hyperthyroism not well controlled  Biophysical profile 8/8  I spoke to Ms. Uvaldo Rising regarding the management of her  hyperthyroidism.  She shared that she is under the care of Dr.  Janese Banks an Endocrinologist at [facility]. Dr. Janese Banks notes that  Ms. Gullion had symptoms of maternal tachycardia and  tremors. Her TSH was not detectable and her FT3 and FT4  were elevated. Her tremors and tachycardia are being treated  with Labetalol 300 TID. Ms.Aloia is still experiencing mild  tachycardia and hand tremors, which she says has improved.  I suggested that clinical and laboratory findings are  indications for treatment with Methimazole or PTU, however,  there was a follow up plan for labs and treatment per Dr. Janese Banks  that I wanted to follow up on. I spoke with Dr. Jeral Fruit who was  her partner on call and he planned to message Dr. Janese Banks to call  me to secure a treatment plan for Ms. Marse.  I relayed this conversation with Dr. Tilden Dome and Dr. Jeneen Rinks. Dr.  Nehemiah Settle and I agreed to  have her seen again this week and  initiate treatment if we have not heard from Dr. Janese Banks.  I conveyed this plan to Ms. Keesey and gave her precautions  for thyroid storm.  Ms. Sole has negative TSH receptor stimulating antibodies. ---------------------------------------------------------------------- Recommendations  Continue weekly testing  Initiate methimazole, consider CBC and LFT's prior to  treatment. Without hold for neutrophil count <1000 or  elevated LFTs.  Start therapy at 5 to 15 mg daily. ----------------------------------------------------------------------               Sander Nephew, MD Electronically Signed Final Report   08/09/2019 03:08 pm ----------------------------------------------------------------------  [place] MFM OB FOLLOW UP  Result Date: 08/23/2019 ----------------------------------------------------------------------  OBSTETRICS REPORT                       (Signed Final 08/23/2019 11:34 am) ---------------------------------------------------------------------- Patient Info  ID #:       [id number]                          D.O.B.:  1991-01-23 (28 yrs)  Name:       [facility]                  Visit Date: 08/23/2019 10:59 am ---------------------------------------------------------------------- Performed By  Performed By:     Novella Rob,       Ref. Address:     [address]                    [place], [place]  [address]  Attending:        Ma Rings MD         [facility]  Referred By:      Hermina Staggers                    MD ---------------------------------------------------------------------- Orders   #  Description                          Code         Ordered By   1  [place] MFM FETAL BPP WO NON               76819.01     RAVI SHANKAR      STRESS   2  [place] MFM OB FOLLOW UP                  76816.01     RAVI [facility]  ----------------------------------------------------------------------   #  Order #                    Accession #                 Episode #   1  [id number]                  [id number]                  [id number]   2  [id number]                  [id number]                  [id number]  ---------------------------------------------------------------------- Indications   Hypertension - Chronic/Pre-existing            O10.019   (labetalol)   History of sickle cell trait (abnormal Natera -Z86.2   carrier)   Obesity complicating pregnancy, second         O99.212   trimester (BMI 34)   Hyperthyroid                                   O99.280 E05.90   Asthma - (albuterol only)                      O99.89 j45.909   Low Risk NIPS   [redacted] weeks gestation of pregnancy                Z3A.36  ---------------------------------------------------------------------- Vital Signs  Weight (lb): 251                               Height:        5'7"  BMI:         39.31 ---------------------------------------------------------------------- Fetal Evaluation  Num Of Fetuses:         1  Fetal Heart Rate(bpm):  155  Cardiac Activity:       Observed  Presentation:           Cephalic  Placenta:               Anterior  P. Cord Insertion:      Previously Visualized  Amniotic Fluid  AFI FV:      Within normal limits  AFI Sum(cm)     %Tile       Largest Pocket(cm)  21.49           81          7.99  RUQ(cm)       RLQ(cm)       LUQ(cm)        LLQ(cm)  5.62          2.44          5.44           7.99 ---------------------------------------------------------------------- Biophysical Evaluation  Amniotic F.V:   Within normal limits       F. Tone:        Observed  F. Movement:    Observed                   Score:          8/8  F. Breathing:   Observed ---------------------------------------------------------------------- Biometry  BPD:      90.8  mm      G. Age:  36w 6d         77  %    CI:        77.42   %    70 - 86                                                          FL/HC:      21.8   %    20.1 - 22.1  HC:      326.7  mm     G. Age:  37w 0d         42  %    HC/AC:      0.95        0.93 - 1.11  AC:      343.9  mm     G. Age:  38w 2d         97  %    FL/BPD:     78.4   %    71 - 87  FL:       71.2  mm     G. Age:  36w 3d         55  %    FL/AC:      20.7   %    20 - 24  LV:        4.9  mm  Est. FW:    3244  gm      7 lb 2 oz     87  % ---------------------------------------------------------------------- OB History  Gravidity:    3         Term:   0 ---------------------------------------------------------------------- Gestational Age  U/S Today:     37w 1d  EDD:   09/12/19  Best:          36w 1d     Det. ByMarcella Dubs:  Early Ultrasound         EDD:   09/19/19                                      (02/17/19) ---------------------------------------------------------------------- Anatomy  Cranium:               Appears normal         Aortic Arch:            Previously seen  Cavum:                 Appears normal         Ductal Arch:            Previously seen  Ventricles:            Appears normal         Diaphragm:              Appears normal  Choroid Plexus:        Previously seen        Stomach:                Appears normal, left                                                                        sided  Cerebellum:            Previously seen        Abdomen:                Appears normal  Posterior Fossa:       Previously seen        Abdominal Wall:         Previously seen  Nuchal Fold:           Previously seen        Cord Vessels:           Previously seen  Face:                  Orbits and profile     Kidneys:                Appear normal                         previously seen  Lips:                  Previously seen        Bladder:                Appears normal  Thoracic:              Appears normal         Spine:                   Previously seen  Previously seen  Heart:                 Appears normal         Upper Extremities:      Previously seen                         (4CH, axis, and                         situs)  RVOT:                  Previously seen        Lower Extremities:      Previously seen  LVOT:                  Previously seen  Other:  Female gender Heels, 5th digit and Open hands previously          visualized. Technically difficult due to maternal habitus and fetal          position. ---------------------------------------------------------------------- Cervix Uterus Adnexa  Cervix  Not visualized (advanced GA >24wks) ---------------------------------------------------------------------- Comments  This patient was seen for a follow up growth scan due to a  history of chronic hypertension currently treated with labetalol  and history of hyperthyroidism.  She denies any problems  since her last exam.  She was informed that the fetal growth and amniotic fluid  level appears appropriate for her gestational age.  A biophysical profile performed today was 8 out of 8.  She will return next week for another biophysical profile. ----------------------------------------------------------------------                   Ma Rings, MD Electronically Signed Final Report   08/23/2019 11:34 am ----------------------------------------------------------------------   Assessment and Plan:  Pregnancy: G3P0020 at [redacted]w[redacted]d 1. GBS (group B Streptococcus carrier), +RV culture, currently pregnant Tx while in labor  2. Supervision of high risk pregnancy, antepartum Stable Labor precautions  3. Chronic hypertension affecting pregnancy BP stable Continue with antenatal testing IOL scheduled for Sunday   4. Hyperthyroidism affecting pregnancy in third trimester Stable Continue with current treatment  Term labor symptoms and general obstetric precautions including but not limited to vaginal bleeding,  contractions, leaking of fluid and fetal movement were reviewed in detail with the patient. I discussed the assessment and treatment plan with the patient. The patient was provided an opportunity to ask questions and all were answered. The patient agreed with the plan and demonstrated an understanding of the instructions. The patient was advised to call back or seek an in-person office evaluation/go to [facility] at [facility] for any urgent or concerning symptoms. Please refer to After Visit Summary for other counseling recommendations.   I provided 8 minutes of face-to-face time during this encounter.  No follow-ups on file.  Future Appointments  Date Time Provider Department Center  09/06/2019  2:00 PM WH-MFC NURSE WH-MFC MFC-US  09/06/2019  2:00 PM WH-MFC [place] 3 WH-MFCUS MFC-US  09/09/2019  9:15 AM MC-SCREENING MC-SDSC None  09/11/2019 12:00 AM MC-LD SCHED ROOM MC-INDC None    Hermina Staggers, MD [facility], [facility]

## 2019-09-06 ENCOUNTER — Encounter (HOSPITAL_COMMUNITY): Payer: Self-pay | Admitting: *Deleted

## 2019-09-06 ENCOUNTER — Ambulatory Visit (HOSPITAL_COMMUNITY)
Admission: RE | Admit: 2019-09-06 | Discharge: 2019-09-06 | Disposition: A | Discharge status: Home / Self Care | Payer: Medicaid Other | Source: Ambulatory Visit | Attending: Maternal & Fetal Medicine | Admitting: Maternal & Fetal Medicine

## 2019-09-06 ENCOUNTER — Ambulatory Visit (HOSPITAL_COMMUNITY): Payer: Medicaid Other | Admitting: *Deleted

## 2019-09-06 ENCOUNTER — Other Ambulatory Visit: Payer: Self-pay

## 2019-09-06 DIAGNOSIS — Z862 Personal history of diseases of the blood and blood-forming organs and certain disorders involving the immune mechanism: Secondary | ICD-10-CM

## 2019-09-06 DIAGNOSIS — O99820 Streptococcus B carrier state complicating pregnancy: Secondary | ICD-10-CM

## 2019-09-06 DIAGNOSIS — O99212 Obesity complicating pregnancy, second trimester: Secondary | ICD-10-CM

## 2019-09-06 DIAGNOSIS — O99891 Other specified diseases and conditions complicating pregnancy: Secondary | ICD-10-CM

## 2019-09-06 DIAGNOSIS — O10019 Pre-existing essential hypertension complicating pregnancy, unspecified trimester: Secondary | ICD-10-CM

## 2019-09-06 DIAGNOSIS — E0590 Thyrotoxicosis, unspecified without thyrotoxic crisis or storm: Secondary | ICD-10-CM

## 2019-09-06 DIAGNOSIS — O0990 Supervision of high risk pregnancy, unspecified, unspecified trimester: Secondary | ICD-10-CM | POA: Diagnosis present

## 2019-09-06 DIAGNOSIS — O10913 Unspecified pre-existing hypertension complicating pregnancy, third trimester: Secondary | ICD-10-CM | POA: Insufficient documentation

## 2019-09-06 DIAGNOSIS — O99280 Endocrine, nutritional and metabolic diseases complicating pregnancy, unspecified trimester: Secondary | ICD-10-CM

## 2019-09-06 DIAGNOSIS — Z3A38 38 weeks gestation of pregnancy: Secondary | ICD-10-CM

## 2019-09-06 DIAGNOSIS — J45909 Unspecified asthma, uncomplicated: Secondary | ICD-10-CM

## 2019-09-07 ENCOUNTER — Other Ambulatory Visit: Payer: Self-pay | Admitting: Advanced Practice Midwife

## 2019-09-09 ENCOUNTER — Other Ambulatory Visit (HOSPITAL_COMMUNITY)
Admission: RE | Admit: 2019-09-09 | Discharge: 2019-09-09 | Disposition: A | Discharge status: Home / Self Care | Payer: Medicaid Other | Source: Ambulatory Visit | Attending: Family Medicine | Admitting: Family Medicine

## 2019-09-09 DIAGNOSIS — Z01812 Encounter for preprocedural laboratory examination: Secondary | ICD-10-CM | POA: Diagnosis not present

## 2019-09-09 DIAGNOSIS — Z20822 Contact with and (suspected) exposure to covid-19: Secondary | ICD-10-CM | POA: Diagnosis not present

## 2019-09-09 LAB — SARS CORONAVIRUS 2 (TAT 6-24 HRS): SARS Coronavirus 2: NEGATIVE

## 2019-09-10 NOTE — H&P (Addendum)
OBSTETRIC ADMISSION HISTORY AND PHYSICAL  Kathleen [name] is a 29 y.o. female G3P0020 with IUP at [redacted]w[redacted]d by early u/s presenting for IOL for cHTN. She reports +FMs, No LOF, no VB, no blurry vision, headaches or peripheral edema, and RUQ pain.  She plans on breast feeding. She request pills for birth control. She received her prenatal [facility] at [facility]  Dating: By early u/s --->  Estimated Date of Delivery: 09/19/19  Sono:    @[redacted]w[redacted]d , CWD, normal anatomy, cephalic presentation, 3244g, EFW   Prenatal History/Complications: cHTN GBS carrier Hyperthyroidism Sickle cell trait Asthma  Past Medical History: Past Medical History:  Diagnosis Date  . Asthma   . Hypertension   . Hyperthyroidism     Past Surgical History: Past Surgical History:  Procedure Laterality Date  . DILATION AND EVACUATION N/A 12/16/2012   Procedure: DILATATION AND EVACUATION;  Surgeon: 12/18/2012, MD;  Location: [facility];  Service: Gynecology;  Laterality: N/A;  . WISDOM TOOTH EXTRACTION  2009    Obstetrical History: OB History     Gravida  3   Para      Term      Preterm      AB  2   Living  0      SAB      TAB      Ectopic      Multiple      Live Births              Social History Social History   Socioeconomic History  . Marital status: Single    Spouse name: Not on file  . Number of children: Not on file  . Years of education: Not on file  . Highest education level: Not on file  Occupational History  . Not on file  Tobacco Use  . Smoking status: Former 2010  . Smokeless tobacco: Never Used  Substance and Sexual Activity  . Alcohol use: No  . Drug use: Not Currently    Types: Marijuana    Comment: last smoked over a yr ago  . Sexual activity: Yes    Birth control/protection: None  Other Topics Concern  . Not on file  Social History Narrative  . Not on file   Social Determinants of Health   Financial Resource Strain:   . Difficulty of Paying Living  Expenses:   Food Insecurity:   . Worried About Games developer in the Last Year:   . Programme researcher, broadcasting/film/video in the Last Year:   Transportation Needs:   . Barista (Medical):   Freight forwarder Lack of Transportation (Non-Medical):   Physical Activity:   . Days of Exercise per Week:   . Minutes of Exercise per Session:   Stress:   . Feeling of Stress :   Social Connections:   . Frequency of Communication with Friends and Family:   . Frequency of Social Gatherings with Friends and Family:   . Attends Religious Services:   . Active Member of Clubs or Organizations:   . Attends [employer] Meetings:   Banker Marital Status:     Family History: Family History  Problem Relation Age of Onset  . Cancer Mother   . Hypertension Mother     Allergies: No Known Allergies  Medications Prior to Admission  Medication Sig Dispense Refill Last Dose  . albuterol (VENTOLIN HFA) 108 (90 Base) MCG/ACT inhaler Inhale 1-2 puffs into the lungs every 6 (six) hours as needed  for wheezing. For wheezing and shortness of breath. 1 g 1   . aspirin EC 81 MG tablet Take 1 tablet (81 mg total) by mouth daily. Take after 12 weeks for prevention of preeclampsia later in pregnancy 300 tablet 2   . Blood Pressure Monitoring DEVI 1 Device by Does not apply route once a week. ICD CODE; O09.90 1 Device 0   . labetalol (NORMODYNE) 100 MG tablet Take 3 tablets (300 mg total) by mouth 2 (two) times daily. 180 tablet 2   . methimazole (TAPAZOLE) 5 MG tablet Take 5 mg by mouth daily.     . mupirocin ointment (BACTROBAN) 2 % Place 1 application into the nose 2 (two) times daily. (Patient not taking: Reported on 06/28/2019) 22 g 0   . Prenatal Vit-Fe Fumarate-FA (PRENATAL MULTIVITAMIN) TABS tablet Take 1 tablet by mouth daily at 12 noon.        Review of Systems   All systems reviewed and negative except as stated in HPI  Blood pressure (!) 148/76, pulse (!) 101, temperature 98.2 F (36.8 C), temperature source Oral,  resp. rate 18, height 5\' 7"  (1.702 m), weight 117.1 kg, last menstrual period 12/09/2018, unknown if currently breastfeeding. General appearance: alert, cooperative and no distress Lungs: clear to auscultation bilaterally Heart: regular rate and rhythm Abdomen: soft, non-tender; bowel sounds normal Pelvic: Deferred Extremities: Homans sign is negative, no sign of DVT Presentation: cephalic Fetal monitoringBaseline: 150 bpm, Variability: Good {> 6 bpm) and Accelerations: Reactive Uterine activityNone Dilation: Fingertip Effacement (%): Thick Station: -3 Exam by:: Dr. [email or web address]   Prenatal labs: ABO, Rh: --/--/O POS, O POS Performed at [facility], [address], [place], [place]  [phone]/21 0027) Antibody: NEG (03/21 0027) Rubella:   RPR: Non Reactive (01/05 0838)  HBsAg:    HIV: Non Reactive (01/05 07-[birth year]-1981)  GBS: Positive/-- (03/02 1154)  2 hr Glucola wnl Genetic screening  NIPS neg, sickle cell carrier Anatomy 04-30-1992 normal  Prenatal Transfer Tool  Maternal Diabetes: No Genetic Screening: Sickle cell carrier, NIPS neg Maternal Ultrasounds/Referrals: Normal Fetal Ultrasounds or other Referrals:  None Maternal Substance Abuse:  No Significant Maternal Medications:  Meds include: Other:  Significant Maternal Lab Results: Group B Strep positive  Results for orders placed or performed during the hospital encounter of 09/11/19 (from the past 24 hour(s))  CBC   Collection Time: 09/11/19 12:27 AM  Result Value Ref Range   WBC 9.0 4.0 - 10.5 K/uL   RBC 4.24 3.87 - 5.11 MIL/uL   Hemoglobin 12.7 12.0 - 15.0 g/dL   HCT 09/13/19 23.5 - 57.3 %   MCV 87.0 80.0 - 100.0 fL   MCH 30.0 26.0 - 34.0 pg   MCHC 34.4 30.0 - 36.0 g/dL   RDW 22.0 25.4 - 27.0 %   Platelets 238 150 - 400 K/uL   nRBC 0.0 0.0 - 0.2 %  Comprehensive metabolic panel   Collection Time: 09/11/19 12:27 AM  Result Value Ref Range   Sodium 136 135 - 145 mmol/L   Potassium 3.9 3.5 - 5.1 mmol/L   Chloride 106  98 - 111 mmol/L   CO2 22 22 - 32 mmol/L   Glucose, Bld 91 70 - 99 mg/dL   BUN 6 6 - 20 mg/dL   Creatinine, Ser 09/13/19 0.44 - 1.00 mg/dL   Calcium 9.3 8.9 - 7.62 mg/dL   Total Protein 6.6 6.5 - 8.1 g/dL   Albumin 2.9 (L) 3.5 - 5.0 g/dL   AST  24 15 - 41 U/L   ALT 20 0 - 44 U/L   Alkaline Phosphatase 103 38 - 126 U/L   Total Bilirubin 0.9 0.3 - 1.2 mg/dL   GFR calc non Af Amer >60 >60 mL/min   GFR calc Af Amer >60 >60 mL/min   Anion gap 8 5 - 15  Type and screen   Collection Time: 09/11/19 12:27 AM  Result Value Ref Range   ABO/RH(D) O POS    Antibody Screen NEG    Sample Expiration      09/14/2019,2359 Performed at [facility], [place] [address], [place], [place] [address]   ABO/Rh   Collection Time: 09/11/19 12:27 AM  Result Value Ref Range   ABO/RH(D)      O POS Performed at [facility] [address] [place], [place] [address]     Patient Active Problem List   Diagnosis Date Noted  . GBS (group B Streptococcus carrier), +RV culture, currently pregnant 08/26/2019  . Hyperthyroidism affecting pregnancy 04/04/2019  . Sickle cell trait ([place]) 03/21/2019  . Supervision of high risk pregnancy, antepartum 03/07/2019  . Chronic hypertension affecting pregnancy 03/07/2019  . Asthma affecting pregnancy in first trimester 03/07/2019    Assessment/Plan:  Kathleen [name] is a 29 y.o. G3P0020 at [redacted]w[redacted]d here for IOL for cHTN.  #Labor: Vaginal cytotec placed. Will evaluate in 4 hours for FB vs repeat cytotec. #Pain: Analgesia PRN #FWB: Cat I, EFW 7#8 by Leopold's #ID: GBS pos - PCN #MOF: Breast #MOC: POPs #Circ:  N/A #Hyperthyroidism: On Methimazole 5mg . Will continue. #Asthma: Albuterol prn. #cHTN: Home medications include Labetalol 300mg  BID. Labs pending. Most recent BP 148/76. No severe range pressures since admission, no severe features.  Lurline Del, DO  09/11/2019, 1:35 AM  GME ATTESTATION:  I saw and evaluated the patient. I agree with the findings and  the plan of care as documented in the resident's note.  Merilyn Baba, DO OB Fellow, [place] [facility] 09/11/2019 1:40 AM

## 2019-09-11 ENCOUNTER — Encounter (HOSPITAL_COMMUNITY): Payer: Self-pay | Admitting: Obstetrics and Gynecology

## 2019-09-11 ENCOUNTER — Inpatient Hospital Stay (HOSPITAL_COMMUNITY): Payer: Medicaid Other

## 2019-09-11 ENCOUNTER — Inpatient Hospital Stay (HOSPITAL_COMMUNITY)
Admission: AD | Admit: 2019-09-11 | Discharge: 2019-09-13 | DRG: 807 | Disposition: A | Discharge status: Home / Self Care | Payer: Medicaid Other | Attending: Obstetrics and Gynecology | Admitting: Obstetrics and Gynecology

## 2019-09-11 ENCOUNTER — Inpatient Hospital Stay (HOSPITAL_COMMUNITY): Payer: Medicaid Other | Admitting: Anesthesiology

## 2019-09-11 ENCOUNTER — Other Ambulatory Visit: Payer: Self-pay

## 2019-09-11 DIAGNOSIS — O1002 Pre-existing essential hypertension complicating childbirth: Principal | ICD-10-CM | POA: Diagnosis present

## 2019-09-11 DIAGNOSIS — O99824 Streptococcus B carrier state complicating childbirth: Secondary | ICD-10-CM | POA: Diagnosis present

## 2019-09-11 DIAGNOSIS — D573 Sickle-cell trait: Secondary | ICD-10-CM | POA: Diagnosis present

## 2019-09-11 DIAGNOSIS — O99284 Endocrine, nutritional and metabolic diseases complicating childbirth: Secondary | ICD-10-CM | POA: Diagnosis present

## 2019-09-11 DIAGNOSIS — O99820 Streptococcus B carrier state complicating pregnancy: Secondary | ICD-10-CM | POA: Diagnosis not present

## 2019-09-11 DIAGNOSIS — O99013 Anemia complicating pregnancy, third trimester: Secondary | ICD-10-CM | POA: Diagnosis not present

## 2019-09-11 DIAGNOSIS — E0590 Thyrotoxicosis, unspecified without thyrotoxic crisis or storm: Secondary | ICD-10-CM | POA: Diagnosis present

## 2019-09-11 DIAGNOSIS — O9902 Anemia complicating childbirth: Secondary | ICD-10-CM | POA: Diagnosis present

## 2019-09-11 DIAGNOSIS — O99511 Diseases of the respiratory system complicating pregnancy, first trimester: Secondary | ICD-10-CM | POA: Diagnosis present

## 2019-09-11 DIAGNOSIS — Z87891 Personal history of nicotine dependence: Secondary | ICD-10-CM

## 2019-09-11 DIAGNOSIS — J45909 Unspecified asthma, uncomplicated: Secondary | ICD-10-CM | POA: Diagnosis present

## 2019-09-11 DIAGNOSIS — O10919 Unspecified pre-existing hypertension complicating pregnancy, unspecified trimester: Secondary | ICD-10-CM | POA: Diagnosis present

## 2019-09-11 DIAGNOSIS — Z3A38 38 weeks gestation of pregnancy: Secondary | ICD-10-CM

## 2019-09-11 DIAGNOSIS — O9952 Diseases of the respiratory system complicating childbirth: Secondary | ICD-10-CM | POA: Diagnosis present

## 2019-09-11 DIAGNOSIS — O10013 Pre-existing essential hypertension complicating pregnancy, third trimester: Secondary | ICD-10-CM | POA: Diagnosis not present

## 2019-09-11 DIAGNOSIS — O0990 Supervision of high risk pregnancy, unspecified, unspecified trimester: Secondary | ICD-10-CM

## 2019-09-11 LAB — COMPREHENSIVE METABOLIC PANEL
ALT: 20 U/L (ref 0–44)
AST: 24 U/L (ref 15–41)
Albumin: 2.9 g/dL — ABNORMAL LOW (ref 3.5–5.0)
Alkaline Phosphatase: 103 U/L (ref 38–126)
Anion gap: 8 (ref 5–15)
BUN: 6 mg/dL (ref 6–20)
CO2: 22 mmol/L (ref 22–32)
Calcium: 9.3 mg/dL (ref 8.9–10.3)
Chloride: 106 mmol/L (ref 98–111)
Creatinine, Ser: 0.6 mg/dL (ref 0.44–1.00)
GFR calc Af Amer: >60 mL/min (ref >60)
GFR calc non Af Amer: >60 mL/min (ref >60)
Glucose, Bld: 91 mg/dL (ref 70–99)
Potassium: 3.9 mmol/L (ref 3.5–5.1)
Sodium: 136 mmol/L (ref 135–145)
Total Bilirubin: 0.9 mg/dL (ref 0.3–1.2)
Total Protein: 6.6 g/dL (ref 6.5–8.1)

## 2019-09-11 LAB — ABO/RH: ABO/RH(D): O POS

## 2019-09-11 LAB — CBC
HCT: 36.9 % (ref 36.0–46.0)
Hemoglobin: 12.7 g/dL (ref 12.0–15.0)
MCH: 30 pg (ref 26.0–34.0)
MCHC: 34.4 g/dL (ref 30.0–36.0)
MCV: 87 fL (ref 80.0–100.0)
Platelets: 238 10*3/uL (ref 150–400)
RBC: 4.24 MIL/uL (ref 3.87–5.11)
RDW: 13.2 % (ref 11.5–15.5)
WBC: 9 10*3/uL (ref 4.0–10.5)
nRBC: 0 % (ref 0.0–0.2)

## 2019-09-11 LAB — TYPE AND SCREEN
ABO/RH(D): O POS
Antibody Screen: NEGATIVE

## 2019-09-11 LAB — PROTEIN / CREATININE RATIO, URINE
Creatinine, Urine: 94.92 mg/dL
Protein Creatinine Ratio: 0.07 mg/mg{Cre} (ref 0.00–0.15)
Total Protein, Urine: 7 mg/dL

## 2019-09-11 LAB — RPR: RPR Ser Ql: NONREACTIVE

## 2019-09-11 LAB — HEPATITIS B SURFACE ANTIGEN: Hepatitis B Surface Ag: NONREACTIVE

## 2019-09-11 MED ORDER — METHIMAZOLE 5 MG PO TABS
5.0000 mg | ORAL_TABLET | Freq: Every day | ORAL | Status: DC
  Administered 2019-09-11: 5 mg via ORAL
  Filled 2019-09-11 (×3): qty 1

## 2019-09-11 MED ORDER — ALBUTEROL SULFATE (2.5 MG/3ML) 0.083% IN NEBU: 2.5000 mg | INHALATION_SOLUTION | Freq: Four times a day (QID) | RESPIRATORY_TRACT | Status: DC | PRN

## 2019-09-11 MED ORDER — MISOPROSTOL 50MCG HALF TABLET
50.0000 ug | ORAL_TABLET | ORAL | Status: DC | PRN
  Administered 2019-09-11: 09:00:00 50 ug via ORAL
  Filled 2019-09-11: qty 1

## 2019-09-11 MED ORDER — TERBUTALINE SULFATE 1 MG/ML IJ SOLN: 0.2500 mg | Freq: Once | INTRAMUSCULAR | Status: DC | PRN

## 2019-09-11 MED ORDER — LIDOCAINE HCL (PF) 1 % IJ SOLN
INTRAMUSCULAR | Status: DC | PRN
  Administered 2019-09-11: 6 mL via EPIDURAL

## 2019-09-11 MED ORDER — LACTATED RINGERS IV SOLN
500.0000 mL | INTRAVENOUS | Status: DC | PRN
  Administered 2019-09-11 – 2019-09-12 (×4): 500 mL via INTRAVENOUS

## 2019-09-11 MED ORDER — MISOPROSTOL 25 MCG QUARTER TABLET
25.0000 ug | ORAL_TABLET | ORAL | Status: DC | PRN
  Administered 2019-09-11: 01:00:00 25 ug via VAGINAL
  Filled 2019-09-11: qty 1

## 2019-09-11 MED ORDER — OXYTOCIN 40 UNITS IN NORMAL SALINE INFUSION - SIMPLE MED
1.0000 m[IU]/min | INTRAVENOUS | Status: DC
  Administered 2019-09-11: 19:00:00 2 m[IU]/min via INTRAVENOUS
  Filled 2019-09-11: qty 1000

## 2019-09-11 MED ORDER — SODIUM CHLORIDE 0.9 % IV SOLN
5.0000 10*6.[IU] | Freq: Once | INTRAVENOUS | Status: AC
  Administered 2019-09-11: 01:00:00 5 10*6.[IU] via INTRAVENOUS
  Filled 2019-09-11: qty 5

## 2019-09-11 MED ORDER — OXYTOCIN BOLUS FROM INFUSION
500.0000 mL | Freq: Once | INTRAVENOUS | Status: AC
  Administered 2019-09-12: 500 mL via INTRAVENOUS

## 2019-09-11 MED ORDER — LACTATED RINGERS IV SOLN: 500.0000 mL | Freq: Once | INTRAVENOUS | Status: DC

## 2019-09-11 MED ORDER — DIPHENHYDRAMINE HCL 50 MG/ML IJ SOLN: 12.5000 mg | INTRAMUSCULAR | Status: DC | PRN

## 2019-09-11 MED ORDER — LABETALOL HCL 200 MG PO TABS
300.0000 mg | ORAL_TABLET | Freq: Two times a day (BID) | ORAL | Status: DC
  Administered 2019-09-11: 300 mg via ORAL
  Filled 2019-09-11: qty 1

## 2019-09-11 MED ORDER — MISOPROSTOL 25 MCG QUARTER TABLET
ORAL_TABLET | ORAL | Status: AC
  Filled 2019-09-11: qty 1

## 2019-09-11 MED ORDER — MISOPROSTOL 25 MCG QUARTER TABLET
25.0000 ug | ORAL_TABLET | ORAL | Status: DC | PRN
  Administered 2019-09-11: 25 ug via VAGINAL

## 2019-09-11 MED ORDER — LIDOCAINE HCL (PF) 1 % IJ SOLN: 30.0000 mL | INTRAMUSCULAR | Status: DC | PRN

## 2019-09-11 MED ORDER — FENTANYL CITRATE (PF) 100 MCG/2ML IJ SOLN
INTRAMUSCULAR | Status: AC
  Filled 2019-09-11: qty 2

## 2019-09-11 MED ORDER — PHENYLEPHRINE 40 MCG/ML (10ML) SYRINGE FOR IV PUSH (FOR BLOOD PRESSURE SUPPORT)
80.0000 ug | PREFILLED_SYRINGE | INTRAVENOUS | Status: DC | PRN
  Filled 2019-09-11: qty 10

## 2019-09-11 MED ORDER — SOD CITRATE-CITRIC ACID 500-334 MG/5ML PO SOLN: 30.0000 mL | ORAL | Status: DC | PRN

## 2019-09-11 MED ORDER — SODIUM CHLORIDE (PF) 0.9 % IJ SOLN
INTRAMUSCULAR | Status: DC | PRN
  Administered 2019-09-11: 12 mL/h via EPIDURAL

## 2019-09-11 MED ORDER — EPHEDRINE 5 MG/ML INJ: 10.0000 mg | INTRAVENOUS | Status: DC | PRN

## 2019-09-11 MED ORDER — PENICILLIN G POT IN DEXTROSE 60000 UNIT/ML IV SOLN
3.0000 10*6.[IU] | INTRAVENOUS | Status: DC
  Administered 2019-09-11 – 2019-09-12 (×6): 3 10*6.[IU] via INTRAVENOUS
  Filled 2019-09-11 (×6): qty 50

## 2019-09-11 MED ORDER — ONDANSETRON HCL 4 MG/2ML IJ SOLN
4.0000 mg | Freq: Four times a day (QID) | INTRAMUSCULAR | Status: DC | PRN
  Administered 2019-09-12: 4 mg via INTRAVENOUS
  Filled 2019-09-11: qty 2

## 2019-09-11 MED ORDER — MISOPROSTOL 50MCG HALF TABLET
50.0000 ug | ORAL_TABLET | Freq: Once | ORAL | Status: AC
  Administered 2019-09-11: 50 ug via BUCCAL
  Filled 2019-09-11: qty 1

## 2019-09-11 MED ORDER — ACETAMINOPHEN 325 MG PO TABS
650.0000 mg | ORAL_TABLET | ORAL | Status: DC | PRN
  Administered 2019-09-12: 650 mg via ORAL
  Filled 2019-09-11: qty 2

## 2019-09-11 MED ORDER — OXYTOCIN 40 UNITS IN NORMAL SALINE INFUSION - SIMPLE MED: 2.5000 [IU]/h | INTRAVENOUS | Status: DC

## 2019-09-11 MED ORDER — FENTANYL CITRATE (PF) 100 MCG/2ML IJ SOLN
100.0000 ug | INTRAMUSCULAR | Status: DC | PRN
  Administered 2019-09-11: 17:00:00 100 ug via INTRAVENOUS

## 2019-09-11 MED ORDER — LACTATED RINGERS IV SOLN: INTRAVENOUS | Status: DC

## 2019-09-11 MED ORDER — FENTANYL CITRATE (PF) 100 MCG/2ML IJ SOLN: 50.0000 ug | INTRAMUSCULAR | Status: DC | PRN

## 2019-09-11 MED ORDER — PHENYLEPHRINE 40 MCG/ML (10ML) SYRINGE FOR IV PUSH (FOR BLOOD PRESSURE SUPPORT): 80.0000 ug | PREFILLED_SYRINGE | INTRAVENOUS | Status: DC | PRN

## 2019-09-11 MED ORDER — FENTANYL-BUPIVACAINE-NACL 0.5-0.125-0.9 MG/250ML-% EP SOLN
12.0000 mL/h | EPIDURAL | Status: DC | PRN
  Filled 2019-09-11: qty 250

## 2019-09-11 NOTE — Anesthesia Procedure Notes (Signed)
Epidural Patient location during procedure: OB Start time: 09/11/2019 5:51 PM End time: 09/11/2019 5:56 PM  Staffing Anesthesiologist: Bethena Midget, MD  Preanesthetic Checklist Completed: patient identified, IV checked, site marked, risks and benefits discussed, surgical consent, monitors and equipment checked, pre-op evaluation and timeout performed  Epidural Patient position: sitting Prep: DuraPrep and site prepped and draped Patient monitoring: continuous pulse ox and blood pressure Approach: midline Location: L3-L4 Injection technique: LOR air  Needle:  Needle type: Tuohy  Needle gauge: 17 G Needle length: 9 cm and 9 Needle insertion depth: 8 cm Catheter type: closed end flexible Catheter size: 19 Gauge Catheter at skin depth: 13 cm Test dose: negative  Assessment Events: blood not aspirated, injection not painful, no injection resistance, no paresthesia and negative IV test

## 2019-09-11 NOTE — Progress Notes (Signed)
Labor Progress Note Kathleen [name] is a 29 y.o. G3P0020 at [redacted]w[redacted]d presented for IOL for cHTN. S:  No complaints O:  BP 137/68   Pulse 88   Temp 98.1 F (36.7 C) (Oral)   Resp 18   Ht 5\' 7"  (1.702 m)   Wt 117.1 kg   LMP 12/09/2018   BMI 40.44 kg/m  EFM: 140/moderate/acels present  CVE: Dilation: Fingertip Effacement (%): Thick Station: -3 Presentation: Vertex Exam by:: Dr. [email or web address]   A&P: 29 y.o. 26 [redacted]w[redacted]d here for IOL for cHTN. #Labor: S/p cytotec x1, FB was attempted but unsuccessful, will give 2nd cytotec and reevaluate in 4 hours. #Pain: Analgesia PRN #FWB: Cat I #GBS positive - PCN #Hyperthyroidism: Continue methimazole 5mg /day #cHTN: Continue home dose of labetalol 300mg  BID. No severe range pressures, PreE labs negative. Most recent pressures 137-138/67-73.  [redacted]w[redacted]d, DO 4:57 AM

## 2019-09-11 NOTE — Progress Notes (Signed)
Labor Progress Note Kathleen [name] is a 29 y.o. G3P0020 at [redacted]w[redacted]d presented for IOL for cHTN.  S:  No complaints at this time. Not feeling contractions frequently.   O:  BP (!) 142/79   Pulse 93   Temp 98.1 F (36.7 C) (Axillary)   Resp 18   Ht 5\' 7"  (1.702 m)   Wt 117.1 kg   LMP 12/09/2018   BMI 40.44 kg/m  EFM: 140/moderate/acels present/absent decels Toco: q2-5 min  CVE: Dilation: 1 Effacement (%): Thick Station: -3 Presentation: Vertex Exam by:: [email or web address], RN   A&P: 29 y.o. 26 [redacted]w[redacted]d here for IOL for cHTN. #Labor: S/p cytotec x2. FB was attempted earlier but unsuccessful. Will give another cytotec. Will consider FB at the next check and will consider using a speculum if needed. AROM/pitocin as indicated. #Pain: Analgesia PRN #FWB: Cat I #GBS positive - PCN #Hyperthyroidism: Continue methimazole 5mg /day. Last T4 was normal. #cHTN: Continue home dose of labetalol 300mg  BID. No severe range pressures, PreE labs negative. Most recent pressure 142/79.  [redacted]w[redacted]d, MD 9:14 AM

## 2019-09-11 NOTE — Progress Notes (Signed)
Labor Progress Note Kathleen [name] is a 29 y.o. G3P0020 at [redacted]w[redacted]d presented for IOL for cHTN.  S:  Feeling lots of cramping pain  O:  BP 135/75   Pulse 96   Temp 98.2 F (36.8 C) (Oral)   Resp 18   Ht 5\' 7"  (1.702 m)   Wt 117.1 kg   LMP 12/09/2018   BMI 40.44 kg/m  EFM: 150/moderate/acels present/single late decel Toco: q1-5 min   CVE: Dilation: 4 Effacement (%): 60 Station: -3, -2 Presentation: Vertex Exam by:: [email or web address], RN   A&P: 29 y.o. 26 [redacted]w[redacted]d here for IOL for cHTN. #Labor: S/p cytotec x3 and FB. Irregular contraction pattern, recommended initiating pitocin and patient is amenable after placement of epidural. #Pain: epidural #FWB: Cat II for single late decel but overall reassuring with moderate variability and accels present, ctm closely #GBS positive - PCN #Hyperthyroidism: Continue methimazole 5mg /day. Last T4 was normal. #cHTN: Continue home dose of labetalol 300mg  BID. No severe range pressures, PreE labs negative.  [redacted]w[redacted]d, MD 5:28 PM

## 2019-09-11 NOTE — Progress Notes (Signed)
Labor Progress Note Kathleen [name] is a 29 y.o. G3P0020 at [redacted]w[redacted]d presented for IOL for cHTN.  S:  No complaints at this time. Not feeling contractions frequently.   O:  BP (!) 121/58   Pulse 91   Temp 98.6 F (37 C) (Oral)   Resp 18   Ht 5\' 7"  (1.702 m)   Wt 117.1 kg   LMP 12/09/2018   BMI 40.44 kg/m  EFM: 150/moderate/acels present/absent decels Toco: q2-4 min  FB placed by Dr 12/11/2018 and filled with 60cc of water. Patient tolerated the procedure well.  CVE: Dilation: 1 Effacement (%): Thick Station: -3 Presentation: Vertex Exam by:: Eckstat   A&P: 29 y.o. 26 [redacted]w[redacted]d here for IOL for cHTN. #Labor: S/p cytotec x3. FB placed. Will give another cytotec vaginally as patient started having increased contractions with the last buccal cytotec. AROM/pitocin as indicated. #Pain: Analgesia PRN #FWB: Cat I #GBS positive - PCN #Hyperthyroidism: Continue methimazole 5mg /day. Last T4 was normal. #cHTN: Continue home dose of labetalol 300mg  BID. No severe range pressures, PreE labs negative. Most recent pressure 121/58.  [redacted]w[redacted]d, MD 1:16 PM

## 2019-09-11 NOTE — Anesthesia Preprocedure Evaluation (Signed)
Anesthesia Evaluation  Patient identified by MRN, date of birth, ID band Patient awake    Reviewed: Allergy & Precautions, H&P , NPO status , Patient's Chart, lab work & pertinent test results, reviewed documented beta blocker date and time   Airway Mallampati: II  TM Distance: >3 FB Neck ROM: full    Dental no notable dental hx.    Pulmonary neg pulmonary ROS, former smoker,    Pulmonary exam normal breath sounds clear to auscultation       Cardiovascular hypertension, Pt. on medications negative cardio ROS Normal cardiovascular exam Rhythm:regular Rate:Normal     Neuro/Psych negative neurological ROS  negative psych ROS   GI/Hepatic negative GI ROS, Neg liver ROS,   Endo/Other  negative endocrine ROS  Renal/GU negative Renal ROS  negative genitourinary   Musculoskeletal   Abdominal   Peds  Hematology negative hematology ROS (+)   Anesthesia Other Findings   Reproductive/Obstetrics (+) Pregnancy                             Anesthesia Physical Anesthesia Plan  ASA: III  Anesthesia Plan: Epidural   Post-op Pain Management:    Induction:   PONV Risk Score and Plan:   Airway Management Planned:   Additional Equipment:   Intra-op Plan:   Post-operative Plan:   Informed Consent: I have reviewed the patients History and Physical, chart, labs and discussed the procedure including the risks, benefits and alternatives for the proposed anesthesia with the patient or authorized representative who has indicated his/her understanding and acceptance.       Plan Discussed with: Anesthesiologist  Anesthesia Plan Comments:         Anesthesia Quick Evaluation

## 2019-09-11 NOTE — Progress Notes (Signed)
Labor Progress Note Kathleen [name] is a 29 y.o. G3P0020 at 105w6d presented for IOL for CHTN  S:  Patient comfortable with epidural  O:  BP 117/73   Pulse (!) 101   Temp 98.5 F (36.9 C) (Oral)   Resp 18   Ht 5\' 7"  (1.702 m)   Wt 117.1 kg   LMP 12/09/2018   SpO2 100%   BMI 40.44 kg/m   Fetal Tracing:  Baseline: 135 Variability: moderate Accels: 15x15 Decels: one variable, otherwise none  Toco: 2-7   CVE: Dilation: 5 Effacement (%): 80 Station: 0 Presentation: Vertex Exam by:: [email or web address] CNM   A&P: 29 y.o. G3P0020 [redacted]w[redacted]d IOL CHTN #Labor: Progressing well. BBOW palpated. Will wait to AROM until in better contraction pattern. Continue to titrate pitocin and recheck in 3-4 hours. #Pain: epidural #FWB: Cat 1 #GBS positive  [redacted]w[redacted]d, CNM 8:24 PM

## 2019-09-12 ENCOUNTER — Telehealth: Payer: Medicaid Other | Admitting: Family Medicine

## 2019-09-12 ENCOUNTER — Encounter (HOSPITAL_COMMUNITY): Payer: Self-pay | Admitting: Obstetrics and Gynecology

## 2019-09-12 DIAGNOSIS — O99820 Streptococcus B carrier state complicating pregnancy: Secondary | ICD-10-CM

## 2019-09-12 DIAGNOSIS — O99013 Anemia complicating pregnancy, third trimester: Secondary | ICD-10-CM

## 2019-09-12 DIAGNOSIS — D573 Sickle-cell trait: Secondary | ICD-10-CM

## 2019-09-12 DIAGNOSIS — Z3A38 38 weeks gestation of pregnancy: Secondary | ICD-10-CM

## 2019-09-12 DIAGNOSIS — O10013 Pre-existing essential hypertension complicating pregnancy, third trimester: Secondary | ICD-10-CM

## 2019-09-12 LAB — CBC
HCT: 35.4 % — ABNORMAL LOW (ref 36.0–46.0)
Hemoglobin: 12 g/dL (ref 12.0–15.0)
MCH: 29.2 pg (ref 26.0–34.0)
MCHC: 33.9 g/dL (ref 30.0–36.0)
MCV: 86.1 fL (ref 80.0–100.0)
Platelets: 234 10*3/uL (ref 150–400)
RBC: 4.11 MIL/uL (ref 3.87–5.11)
RDW: 13.3 % (ref 11.5–15.5)
WBC: 13.1 10*3/uL — ABNORMAL HIGH (ref 4.0–10.5)
nRBC: 0 % (ref 0.0–0.2)

## 2019-09-12 LAB — RUBELLA SCREEN: Rubella: 1.06 index (ref >0.99)

## 2019-09-12 MED ORDER — METHYLERGONOVINE MALEATE 0.2 MG/ML IJ SOLN
INTRAMUSCULAR | Status: AC
  Administered 2019-09-12: 0.2 mg via INTRAMUSCULAR
  Filled 2019-09-12: qty 1

## 2019-09-12 MED ORDER — TETANUS-DIPHTH-ACELL PERTUSSIS 5-2.5-18.5 LF-MCG/0.5 IM SUSP: 0.5000 mL | Freq: Once | INTRAMUSCULAR | Status: DC

## 2019-09-12 MED ORDER — ZOLPIDEM TARTRATE 5 MG PO TABS: 5.0000 mg | ORAL_TABLET | Freq: Every evening | ORAL | Status: DC | PRN

## 2019-09-12 MED ORDER — IBUPROFEN 800 MG PO TABS
800.0000 mg | ORAL_TABLET | Freq: Once | ORAL | Status: AC
  Administered 2019-09-12: 06:00:00 800 mg via ORAL
  Filled 2019-09-12: qty 1

## 2019-09-12 MED ORDER — SENNOSIDES-DOCUSATE SODIUM 8.6-50 MG PO TABS
2.0000 | ORAL_TABLET | ORAL | Status: DC
  Administered 2019-09-12: 2 via ORAL
  Filled 2019-09-12: qty 2

## 2019-09-12 MED ORDER — DIPHENHYDRAMINE HCL 25 MG PO CAPS: 25.0000 mg | ORAL_CAPSULE | Freq: Four times a day (QID) | ORAL | Status: DC | PRN

## 2019-09-12 MED ORDER — MISOPROSTOL 200 MCG PO TABS: 800.0000 ug | ORAL_TABLET | Freq: Once | ORAL | Status: AC

## 2019-09-12 MED ORDER — TRANEXAMIC ACID-NACL 1000-0.7 MG/100ML-% IV SOLN
1000.0000 mg | INTRAVENOUS | Status: AC
  Administered 2019-09-12: 1000 mg via INTRAVENOUS

## 2019-09-12 MED ORDER — MISOPROSTOL 200 MCG PO TABS
ORAL_TABLET | ORAL | Status: AC
  Administered 2019-09-12: 800 ug via ORAL
  Filled 2019-09-12: qty 4

## 2019-09-12 MED ORDER — BENZOCAINE-MENTHOL 20-0.5 % EX AERO: 1.0000 "application " | INHALATION_SPRAY | CUTANEOUS | Status: DC | PRN

## 2019-09-12 MED ORDER — DIBUCAINE (PERIANAL) 1 % EX OINT: 1.0000 "application " | TOPICAL_OINTMENT | CUTANEOUS | Status: DC | PRN

## 2019-09-12 MED ORDER — PRENATAL MULTIVITAMIN CH
1.0000 | ORAL_TABLET | Freq: Every day | ORAL | Status: DC
  Administered 2019-09-12: 12:00:00 1 via ORAL
  Filled 2019-09-12: qty 1

## 2019-09-12 MED ORDER — WITCH HAZEL-GLYCERIN EX PADS: 1.0000 "application " | MEDICATED_PAD | CUTANEOUS | Status: DC | PRN

## 2019-09-12 MED ORDER — IBUPROFEN 600 MG PO TABS
600.0000 mg | ORAL_TABLET | Freq: Four times a day (QID) | ORAL | Status: DC
  Administered 2019-09-12 – 2019-09-13 (×5): 600 mg via ORAL
  Filled 2019-09-12 (×4): qty 1

## 2019-09-12 MED ORDER — ONDANSETRON HCL 4 MG PO TABS: 4.0000 mg | ORAL_TABLET | ORAL | Status: DC | PRN

## 2019-09-12 MED ORDER — ONDANSETRON HCL 4 MG/2ML IJ SOLN: 4.0000 mg | INTRAMUSCULAR | Status: DC | PRN

## 2019-09-12 MED ORDER — SIMETHICONE 80 MG PO CHEW: 80.0000 mg | CHEWABLE_TABLET | ORAL | Status: DC | PRN

## 2019-09-12 MED ORDER — ACETAMINOPHEN 325 MG PO TABS: 650.0000 mg | ORAL_TABLET | ORAL | Status: DC | PRN

## 2019-09-12 MED ORDER — TRANEXAMIC ACID-NACL 1000-0.7 MG/100ML-% IV SOLN
INTRAVENOUS | Status: AC
  Filled 2019-09-12: qty 100

## 2019-09-12 MED ORDER — METHYLERGONOVINE MALEATE 0.2 MG/ML IJ SOLN
INTRAMUSCULAR | Status: AC
  Filled 2019-09-12: qty 1

## 2019-09-12 MED ORDER — COCONUT OIL OIL: 1.0000 "application " | TOPICAL_OIL | Status: DC | PRN

## 2019-09-12 MED ORDER — METHYLERGONOVINE MALEATE 0.2 MG/ML IJ SOLN: 0.2000 mg | Freq: Once | INTRAMUSCULAR | Status: AC

## 2019-09-12 NOTE — Lactation Note (Signed)
This note was copied from a baby's chart. Lactation Consultation Note  Patient Name: Kathleen [name] DKEUV'H Date: 09/12/2019   Initial visit at 7 hours of life. Mom is a P1 who denies breast changes w/pregnancy except for darkening of nipple/areola. Infant has been to the breast 3 times thus far.  Mom attended a virtual breastfeeding class through [facility]. Mom says she is having difficulty with positioning and she is wondering if she should just pump, instead. I encouraged Mom to give me a call next time infant shows feeding cues. I explained to Mom to use her call bell button to ask for Lactation.  Mom was made aware of O/P services, breastfeeding support groups, & our phone # for post-discharge questions.   [name] [facility] 09/12/2019, 11:25 AM

## 2019-09-12 NOTE — Lactation Note (Signed)
This note was copied from a baby's chart. Lactation Consultation Note  Patient Name: Kathleen [name] OIZTI'W Date: 09/12/2019   [facility] visit attempted at 5 hrs of life, but Mom was sleeping. Infant noted to be in bassinet.   Mom noted to be on methimazole 5 mg (L2).   [name] [facility] 09/12/2019, 9:26 AM

## 2019-09-12 NOTE — Progress Notes (Signed)
Labor Progress Note Kathleen [name] is a 29 y.o. G3P0020 at [redacted]w[redacted]d presented for IOL for CHTN  S:  Patient sleeping  O:  BP 131/67   Pulse 96   Temp 98.5 F (36.9 C) (Oral)   Resp 16   Ht 5\' 7"  (1.702 m)   Wt 117.1 kg   LMP 12/09/2018   SpO2 100%   BMI 40.44 kg/m   Fetal Tracing:  Baseline: 135 Variability: moderate Accels: 15x15 Decels: none  Toco: 2-4   CVE: Dilation: 6 Effacement (%): 100 Station: 0, Plus 1 Presentation: Vertex Exam by:: [email or web address], CNM   A&P: 29 y.o. G3P0020 [redacted]w[redacted]d IOL CHTN #Labor: Progressing well. Discussed with patient risks and benefits of AROM for augmentation of labor. Patient agreeable to plan of care. AROM with small amount of clear fluid. Patient and FHR tolerated procedure well.  #Pain: epidural #FWB: Cat 1 #GBS positive  [redacted]w[redacted]d, CNM 12:05 AM

## 2019-09-12 NOTE — Discharge Summary (Addendum)
Postpartum Discharge Summary    Patient Name: Kathleen [name] DOB: 09-24-1990 MRN: [id number]  Date of admission: 09/11/2019 Delivering Provider: Wende Mott   Date of discharge: 09/13/2019  Admitting diagnosis: Chronic hypertension affecting pregnancy [O10.919] Intrauterine pregnancy: [redacted]w[redacted]d    Secondary diagnosis:  Active Problems:   Supervision of high risk pregnancy, antepartum   Chronic hypertension affecting pregnancy   Asthma affecting pregnancy in first trimester   Sickle cell trait ([place]   Hyperthyroidism affecting pregnancy   GBS (group B Streptococcus carrier), +RV culture, currently pregnant  Additional problems: Hyperthyroidism     Discharge diagnosis: Term Pregnancy Delivered and CHTN                                                                                                Post partum procedures:None  Augmentation: AROM, Pitocin, Cytotec and Foley Balloon  Complications: None  Hospital course:  Induction of Labor With Vaginal Delivery   29y.o. yo G3P0020 at 320w0das admitted to the hospital 09/11/2019 for induction of labor.  Indication for induction: chronic hypertension.  Patient had an uncomplicated labor course as follows: induced with FB, cytotec, and pitocin. Membrane Rupture Time/Date: 12:00 AM ,09/12/2019   Intrapartum Procedures: Episiotomy: None [1]                                         Lacerations:  Periurethral [8]  Patient had delivery of a Viable infant.  Information for the patient's newborn:  McRhen, Dossantos0[[id number]]Delivery Method: Vag-Spont    09/12/2019  Details of delivery can be found in separate delivery note.  Patient had a routine postpartum course. BP's were well controlled, switched from labetalol to amlodipine at discharge. Patient is discharged home 09/13/19. Delivery time: 3:32 AM    Magnesium Sulfate received: No BMZ received: No Rhophylac:N/A MMR:N/A Transfusion:No  Physical exam  Vitals:    09/12/19 1545 09/12/19 2005 09/13/19 1010 09/13/19 1156  BP: 130/80 134/82 138/71 124/71  Pulse: 98 97 93 98  Resp: _0 Temp: 98.1 F (36.7 C) 98.1 F (36.7 C) 98.2 F (36.8 C)   TempSrc: Oral Oral Oral   SpO2:   100% 100%  Weight:      Height:       General: alert, cooperative and no distress Chest: no respiratory distress Heart:regular rate, distal pulses intact Abdomen: soft, nontender,  Uterine Fundus: firm, appropriately tender DVT Evaluation: No calf swelling or tenderness Extremities: no LE edema Skin: warm, dry  Labs: Lab Results  Component Value Date   WBC 13.1 (H) 09/12/2019   HGB 12.0 09/12/2019   HCT 35.4 (L) 09/12/2019   MCV 86.1 09/12/2019   PLT 234 09/12/2019   CMP Latest Ref Rng & Units 09/11/2019  Glucose 70 - 99 mg/dL 91  BUN 6 - 20 mg/dL 6  Creatinine 0.44 - 1.00 mg/dL 0.60  Sodium 135 - 145 mmol/L 136  Potassium 3.5 - 5.1 mmol/L 3.9  Chloride 98 -  111 mmol/L 106  CO2 22 - 32 mmol/L 22  Calcium 8.9 - 10.3 mg/dL 9.3  Total Protein 6.5 - 8.1 g/dL 6.6  Total Bilirubin 0.3 - 1.2 mg/dL 0.9  Alkaline Phos 38 - 126 U/L 103  AST 15 - 41 U/L 24  ALT 0 - 44 U/L 20   Edinburgh Score: Edinburgh Postnatal Depression Scale Screening Tool 09/12/2019  I have been able to laugh and see the funny side of things. 0  I have looked forward with enjoyment to things. 0  I have blamed myself unnecessarily when things went wrong. 0  I have been anxious or worried for no good reason. 0  I have felt scared or panicky for no good reason. 0  Things have been getting on top of me. 0  I have been so unhappy that I have had difficulty sleeping. 0  I have felt sad or miserable. 0  I have been so unhappy that I have been crying. 0  The thought of harming myself has occurred to me. 0  Edinburgh Postnatal Depression Scale Total 0    Discharge instruction: per After Visit Summary and "Baby and Me Booklet".  After visit meds:  Allergies as of 09/13/2019   No Known  Allergies     Medication List    STOP taking these medications   aspirin EC 81 MG tablet   Blood Pressure Monitoring Devi   labetalol 100 MG tablet Commonly known as: NORMODYNE   mupirocin ointment 2 % Commonly known as: Bactroban     TAKE these medications   acetaminophen 325 MG tablet Commonly known as: Tylenol Take 2 tablets (650 mg total) by mouth every 4 (four) hours as needed (for pain scale < 4).   albuterol 108 (90 Base) MCG/ACT inhaler Commonly known as: VENTOLIN HFA Inhale 1-2 puffs into the lungs every 6 (six) hours as needed for wheezing. For wheezing and shortness of breath.   amLODipine 5 MG tablet Commonly known as: NORVASC Take 1 tablet (5 mg total) by mouth daily.   ibuprofen 600 MG tablet Commonly known as: ADVIL Take 1 tablet (600 mg total) by mouth every 6 (six) hours.   methimazole 5 MG tablet Commonly known as: TAPAZOLE Take 5 mg by mouth daily.   prenatal multivitamin Tabs tablet Take 1 tablet by mouth daily at 12 noon.   Symbicort 80-4.5 MCG/ACT inhaler Generic drug: budesonide-formoterol Inhale 2 puffs into the lungs in the morning and at bedtime.       Diet: routine diet  Activity: Advance as tolerated. Pelvic rest for 6 weeks.   Outpatient follow up:4 weeks Follow up Appt: Future Appointments  Date Time Provider [place]  09/19/2019  8:30 AM [place]  10/17/2019  8:55 AM Laury Deep, CNM [facility]   Follow up Visit:   Please schedule this patient for Postpartum visit in: 4 weeks with the following provider: MD In-Person For C/S patients schedule nurse incision check in weeks 2 weeks: no High risk pregnancy complicated by: HTN Delivery mode:  SVD Anticipated Birth Control:  POPs PP Procedures needed: BP check  Schedule Integrated BH visit: no   Newborn Data: Live born female  Birth Weight:  3695 grams APGAR: 31, 32  Newborn Delivery   Birth date/time: 09/12/2019 03:32:00 Delivery  type: Vaginal, Spontaneous      Baby Feeding: Breast Disposition:home with mother    OB FELLOW ATTESTATION  I have seen and examined this patient and edited the above documentation  in the resident's note to reflect any changes or updates.  Augustin Coupe, MD/MP[facility] OB Fellow  09/13/2019, 8:21 PM

## 2019-09-12 NOTE — Lactation Note (Signed)
This note was copied from a baby's chart. Lactation Consultation Note  Patient Name: Kathleen [name] [id number] Date: 09/12/2019 Reason for consult: Initial assessment  Infant is 9 hrs old. Mom called out for assist. I helped Mom get into a side-lying position. Infant latched w/ease. Mom was comfortable with latch.   [name] [facility] 09/12/2019, 12:33 PM

## 2019-09-12 NOTE — Anesthesia Postprocedure Evaluation (Signed)
Anesthesia Post Note  Patient: Kathleen [name]  Procedure(s) Performed: AN AD HOC LABOR EPIDURAL     Patient location during evaluation: Mother Baby Anesthesia Type: Epidural Level of consciousness: awake and alert and oriented Pain management: satisfactory to patient Vital Signs Assessment: post-procedure vital signs reviewed and stable Respiratory status: respiratory function stable Cardiovascular status: stable Postop Assessment: no headache, no backache, epidural receding, patient able to bend at knees, no signs of nausea or vomiting and adequate PO intake Anesthetic complications: no    Last Vitals:  Vitals:   09/12/19 0623 09/12/19 0730  BP: 137/80 126/77  Pulse: (!) 106 100  Resp: 18 20  Temp: (!) 39.4 C 37.3 C  SpO2: 100% 99%    Last Pain:  Vitals:   09/12/19 1000  TempSrc:   PainSc: 0-No pain   Pain Goal:                   [name]

## 2019-09-13 MED ORDER — IBUPROFEN 600 MG PO TABS: 600.0000 mg | ORAL_TABLET | Freq: Four times a day (QID) | ORAL | 0 refills | Status: DC

## 2019-09-13 MED ORDER — AMLODIPINE BESYLATE 5 MG PO TABS: 5.0000 mg | ORAL_TABLET | Freq: Every day | ORAL | 11 refills | Status: DC

## 2019-09-13 MED ORDER — ACETAMINOPHEN 325 MG PO TABS: 650.0000 mg | ORAL_TABLET | ORAL | 0 refills | Status: DC | PRN

## 2019-09-13 MED FILL — AMLODIPINE BESYLATE 5 MG TA: 5 | 30 days supply | Qty: 30 | Fill #0

## 2019-09-13 MED FILL — IBUPROFEN 600 MG TABLET: 600 | 7 days supply | Qty: 30 | Fill #0

## 2019-09-13 MED FILL — ACETAMINOPHEN 325 MG TABS: 325 | 3 days supply | Qty: 30 | Fill #0

## 2019-09-13 NOTE — Progress Notes (Signed)
Nursing student notified RN that pt stated she had shortness of breath.  VS were assessed and pt's O2 saturation is 100%.  Patient states she has had shortness of breath on other occasions due to her asthma.  She did use her personal inhaler this time.  Patient is sitting in a chair and states she is not short of breath at this time.  Patient is awaiting discharge after her infant has a blood test.  Will continue to monitor while patient is still here.  Delice Bison [employer] Charity fundraiser

## 2019-09-13 NOTE — Progress Notes (Signed)
POSTPARTUM PROGRESS NOTE  Post Partum Day 1  Subjective:  Kathleen [name] is a 29 y.o. [id number] s/p NSVD at [redacted]w[redacted]d.  She reports she is doing well. No acute events overnight. She denies any problems with ambulating, voiding or po intake. Denies nausea or vomiting.  Pain is well controlled.  Lochia is appropriate.  Objective: Blood pressure 134/82, pulse 97, temperature 98.1 F (36.7 C), temperature source Oral, resp. rate 18, height 5\' 7"  (1.702 m), weight 117.1 kg, last menstrual period 12/09/2018, SpO2 100 %, unknown if currently breastfeeding.  Physical Exam:  General: alert, cooperative and no distress Chest: no respiratory distress Heart:regular rate, distal pulses intact Abdomen: soft, nontender,  Uterine Fundus: firm, appropriately tender DVT Evaluation: No calf swelling or tenderness Extremities: no LE edema Skin: warm, dry  Recent Labs    09/11/19 0027 09/12/19 0720  HGB 12.7 12.0  HCT 36.9 35.4*    Assessment/Plan: Kathleen [name] is a 29 y.o. 26 s/p NSVD at [redacted]w[redacted]d   PPD#1 - Doing well  Routine postpartum care  Contraception: undecided, looking at [redacted]w[redacted]d Feeding: breast Dispo: Plan for discharge PPD#2 per patient preference.   LOS: 2 days   [employer], MD/[facility] OB Fellow  09/13/2019, 7:55 AM

## 2019-09-13 NOTE — Discharge Instructions (Signed)

## 2019-09-13 NOTE — Progress Notes (Signed)
Patient discharged in stable condition.  Kathleen [name] [name] Charity fundraiser

## 2019-09-13 NOTE — Lactation Note (Signed)
This note was copied from a baby's chart. Lactation Consultation Note  Patient Name: Kathleen [name] [id number] Date: 09/13/2019 Reason for consult: Follow-up assessment;Primapara;1st time breastfeeding;Term;Infant weight loss;Other (Comment)(3 % weight loss /early D/C) Baby is 53 hours old .  Per mom  baby last fed at 4:30 am .  LC reviewed basics and the importance of feeding with feeding cues and 8-12 times day. LC stressed its important not to allow the baby to lie in a crib for more than 4 hours. LC stressed the importance of STS feedings until the baby is back to birth weight, gaining steadily and can stay awake for majority of feeding.  LC checked diaper and it was dry.  LC offered to assist to latch and mom receptive.  Football position on the right breast /. Depth achieved and baby opened wide.  Swallows increased with moist heat and breast compressions. Baby still feeding at 22 minutes and per mom  comfortable.  Sore nipple and engorgement prevention , storage of breast milk, reviewed .  LC provided and instructed the use of the hand pump and the #24 F a good fit.  #27 F provided for when the milk comes in.  [facility] encouraged mom to call if breast feeding questions and has the pamphlet.  Mom aware of the virtual support breast feeding support group.    Maternal Data Has patient been taught Hand Expression?: Yes Does the patient have breastfeeding experience prior to this delivery?: No  Feeding Feeding Type: Breast Fed  LATCH Score Latch: Grasps breast easily, tongue down, lips flanged, rhythmical sucking.  Audible Swallowing: A few with stimulation(increased with moist heat and compressions)  Type of Nipple: Everted at rest and after stimulation  Comfort (Breast/Nipple): Soft / non-tender  Hold (Positioning): Assistance needed to correctly position infant at breast and maintain latch.  LATCH Score: 8  Interventions Interventions: Breast feeding basics reviewed;Assisted  with latch;Skin to skin;Breast massage;Hand express;Breast compression;Adjust position;Support pillows;Hand pump  Lactation Tools Discussed/Used Tools: Pump;Flanges Flange Size: 24;27 Breast pump type: Manual Pump Review: Milk Storage;Setup, frequency, and cleaning Initiated by:: MAI Date initiated:: 09/13/19   Consult Status Consult Status: Complete Date: 09/13/19    [name] 09/13/2019, 11:14 AM

## 2019-09-14 LAB — RUBELLA ANTIBODY, IGM: Rubella IgM: <20 AU/mL (ref 0.0–19.9)

## 2019-09-19 ENCOUNTER — Ambulatory Visit: Payer: Medicaid Other

## 2019-10-17 ENCOUNTER — Telehealth (INDEPENDENT_AMBULATORY_CARE_PROVIDER_SITE_OTHER): Payer: Medicaid Other | Admitting: Obstetrics and Gynecology

## 2019-10-17 ENCOUNTER — Other Ambulatory Visit: Payer: Self-pay

## 2019-10-17 NOTE — Progress Notes (Signed)
9:12a- Called pt for virtual visit, pt states had another call on the other line & asked if appt can be rescheduled for later this afternoon, advised will have to ask front desk/. Pt verbalized understanding.

## 2019-10-18 ENCOUNTER — Other Ambulatory Visit: Payer: Self-pay | Admitting: Obstetrics and Gynecology

## 2019-10-18 DIAGNOSIS — J45909 Unspecified asthma, uncomplicated: Secondary | ICD-10-CM

## 2019-10-19 ENCOUNTER — Telehealth: Payer: Self-pay | Admitting: Family Medicine

## 2019-10-19 DIAGNOSIS — J45909 Unspecified asthma, uncomplicated: Secondary | ICD-10-CM

## 2019-10-19 MED ORDER — ALBUTEROL SULFATE HFA 108 (90 BASE) MCG/ACT IN AERS: 1.0000 | INHALATION_SPRAY | Freq: Four times a day (QID) | RESPIRATORY_TRACT | 0 refills | Status: DC | PRN

## 2019-10-19 NOTE — Telephone Encounter (Signed)
Patient called in stating that she needs a refill on her albuterol inhaler. Patient stated the pharmacy has been trying to get in touch with the office but has not be able to reach anyone. Patient instructed that a message will be sent to the nurses and they will reach out to her as soon as they can. Patient instructed that the office is closed tomorrow and Friday due to the move of the office so if she does not hear anything by today then she should hear something by next week. Patient verbalized understanding, message sent to clinical pool.

## 2019-10-19 NOTE — Addendum Note (Signed)
Addended by: Ernestina Patches on: 10/19/2019 02:36 PM   Modules accepted: Orders

## 2019-10-19 NOTE — Telephone Encounter (Signed)
Patient requested inhaler refill. Per dr Vergie Living sent in 1 and will discuss at rescheduled post partum visit in may.

## 2019-11-07 ENCOUNTER — Telehealth (INDEPENDENT_AMBULATORY_CARE_PROVIDER_SITE_OTHER): Payer: Medicaid Other | Admitting: Obstetrics and Gynecology

## 2019-11-07 ENCOUNTER — Other Ambulatory Visit: Payer: Self-pay

## 2019-11-07 DIAGNOSIS — O0990 Supervision of high risk pregnancy, unspecified, unspecified trimester: Secondary | ICD-10-CM

## 2019-11-07 NOTE — Progress Notes (Signed)
2:41p-Called Pt for Virtual visit, she asked to be rescheduled.

## 2019-11-09 ENCOUNTER — Encounter: Payer: Self-pay | Admitting: Obstetrics and Gynecology

## 2019-11-09 NOTE — Progress Notes (Signed)
Patient ID: Kathleen [name], female   DOB: Dec 26, 1990, 29 y.o.   MRN: [id number] Pt unable to do virtual appt. Will reschedule.

## 2019-11-15 ENCOUNTER — Telehealth: Payer: Self-pay | Admitting: Lactation Services

## 2019-11-15 MED ORDER — TERCONAZOLE 0.4 % VA CREA: 1.0000 | TOPICAL_CREAM | Freq: Every day | VAGINAL | 0 refills | Status: DC

## 2019-11-15 NOTE — Telephone Encounter (Signed)
Returned patients call.   Patient reports she has had itchiness and discomfort in her vagina with white thick creamy discharge. She has had yeast infections in the past.   Patient is concerned she may be pregnant. She has been sexually active. She has not resumed her periods.  Terazole cream sent in as patient concerned she may be pregnant.

## 2019-12-22 ENCOUNTER — Ambulatory Visit (INDEPENDENT_AMBULATORY_CARE_PROVIDER_SITE_OTHER): Payer: Medicaid Other | Admitting: *Deleted

## 2019-12-22 ENCOUNTER — Other Ambulatory Visit: Payer: Self-pay

## 2019-12-22 DIAGNOSIS — Z3A01 Less than 8 weeks gestation of pregnancy: Secondary | ICD-10-CM

## 2019-12-22 DIAGNOSIS — K5900 Constipation, unspecified: Secondary | ICD-10-CM

## 2019-12-22 DIAGNOSIS — O99891 Other specified diseases and conditions complicating pregnancy: Secondary | ICD-10-CM

## 2019-12-22 DIAGNOSIS — Z3201 Encounter for pregnancy test, result positive: Secondary | ICD-10-CM

## 2019-12-22 DIAGNOSIS — Z3200 Encounter for pregnancy test, result unknown: Secondary | ICD-10-CM

## 2019-12-22 DIAGNOSIS — Z419 Encounter for procedure for purposes other than remedying health state, unspecified: Secondary | ICD-10-CM | POA: Diagnosis not present

## 2019-12-22 LAB — POCT PREGNANCY, URINE: Preg Test, Ur: POSITIVE — AB

## 2019-12-22 NOTE — Progress Notes (Signed)
Pt left urine specimen for UPT testing earlier today. I called her and advised that the test is positive. She reports sure LMP 11/21/19. This would give EDD 08/27/20 making her [redacted]w[redacted]d today. Pt denies having vaginal bleeding. She endorses mild abdominal cramping last week - none today. Pt requested to know if she should continue taking Amlodipine and Methimazole and I responded that I will confirm with the doctor and send a MyChart message with the answer.  She stated that she is constipated. I advised that I will send in prescription for Colace - dosage discussed. Pt was advised that she should go to [facility] if she develops heavy vaginal bleeding or severe abdominal cramping/pain. She voiced understanding of all information and instructions given.   1820  MyChart message sent to pt instructing her to contact her endocrinologist in order to get alternate prescription in place of Methimazole. She was advised she can take Amlodipine.

## 2019-12-23 NOTE — Progress Notes (Signed)
Patient seen and assessed by nursing staff.  Agree with documentation and plan.  

## 2020-01-19 ENCOUNTER — Ambulatory Visit: Payer: Self-pay

## 2020-01-19 ENCOUNTER — Other Ambulatory Visit: Payer: Self-pay

## 2020-01-19 ENCOUNTER — Ambulatory Visit (INDEPENDENT_AMBULATORY_CARE_PROVIDER_SITE_OTHER): Payer: Medicaid Other | Admitting: Obstetrics & Gynecology

## 2020-01-19 ENCOUNTER — Encounter: Payer: Self-pay | Admitting: Obstetrics & Gynecology

## 2020-01-19 VITALS — BP 130/69 | HR 98 | Wt 229.4 lb

## 2020-01-19 DIAGNOSIS — O3680X0 Pregnancy with inconclusive fetal viability, not applicable or unspecified: Secondary | ICD-10-CM

## 2020-01-19 DIAGNOSIS — E0590 Thyrotoxicosis, unspecified without thyrotoxic crisis or storm: Secondary | ICD-10-CM

## 2020-01-19 DIAGNOSIS — O0990 Supervision of high risk pregnancy, unspecified, unspecified trimester: Secondary | ICD-10-CM

## 2020-01-19 DIAGNOSIS — D573 Sickle-cell trait: Secondary | ICD-10-CM

## 2020-01-19 DIAGNOSIS — O10011 Pre-existing essential hypertension complicating pregnancy, first trimester: Secondary | ICD-10-CM

## 2020-01-19 DIAGNOSIS — O10919 Unspecified pre-existing hypertension complicating pregnancy, unspecified trimester: Secondary | ICD-10-CM

## 2020-01-19 DIAGNOSIS — O99281 Endocrine, nutritional and metabolic diseases complicating pregnancy, first trimester: Secondary | ICD-10-CM | POA: Diagnosis not present

## 2020-01-19 DIAGNOSIS — O99011 Anemia complicating pregnancy, first trimester: Secondary | ICD-10-CM

## 2020-01-19 DIAGNOSIS — O09891 Supervision of other high risk pregnancies, first trimester: Secondary | ICD-10-CM | POA: Insufficient documentation

## 2020-01-19 DIAGNOSIS — Z3A08 8 weeks gestation of pregnancy: Secondary | ICD-10-CM

## 2020-01-19 LAB — POCT URINALYSIS DIP (DEVICE)
Bilirubin Urine: NEGATIVE
Glucose, UA: NEGATIVE mg/dL
Hgb urine dipstick: NEGATIVE
Ketones, ur: NEGATIVE mg/dL
Leukocytes,Ua: NEGATIVE
Nitrite: NEGATIVE
Protein, ur: NEGATIVE mg/dL
Specific Gravity, Urine: 1.01 (ref 1.005–1.030)
Urobilinogen, UA: 0.2 mg/dL (ref 0.0–1.0)
pH: 5.5 (ref 5.0–8.0)

## 2020-01-19 NOTE — Progress Notes (Addendum)
NEW OB packet given  Home Medicaid Form completed  

## 2020-01-19 NOTE — Progress Notes (Addendum)
Subjective:    Kathleen [name] is a W2H8527 at [redacted]w[redacted]d by LMP being seen today for her first obstetrical visit.  Her obstetrical history is significant for chronic hypertension, hyperthyroidism on methimazole, short interval pregnancy, BMI 36, h/x asthma. Patient does intend to breast feed. Pregnancy history fully reviewed.  Had baby in March.   CHTN on amlodipine. Has BP cuff at home. Had been on labetalol in prior pregnancy.  Hyperthyroidism - has been off methimazole for one month. Follows w endocrinology [facility]). Had been started in third trimester of last pregnancy.   FOB not involved. Same FOB of both children.   LMP 5/31  Pap 2020 NILM    Patient reports lower abdominal pain, fatigue.   Vitals:   01/19/20 1013 01/19/20 1021  BP: (!) 141/85 (!) 130/69  Pulse: 104 98  Weight: (!) 229 lb 6.4 oz (104.1 kg)     HISTORY: OB History  Gravida Para Term Preterm AB Living  4 1 1   2 1   SAB TAB Ectopic Multiple Live Births  2     0 1    # Outcome Date GA Lbr Len/2nd Weight Sex Delivery Anes PTL Lv  4 Current           3 Term 09/12/19 [redacted]w[redacted]d / 00:39 8 lb 2.3 oz (3.695 kg) F Vag-Spont EPI  LIV  2 SAB 2016             Birth Comments: System Generated. Please review and update pregnancy details.  1 SAB 2014           Past Medical History:  Diagnosis Date   Asthma    Hypertension    Hyperthyroidism    Past Surgical History:  Procedure Laterality Date   DILATION AND EVACUATION N/A 12/16/2012   Procedure: DILATATION AND EVACUATION;  Surgeon: 12/18/2012, MD;  Location: [facility];  Service: Gynecology;  Laterality: N/A;   WISDOM TOOTH EXTRACTION  2009   Family History  Problem Relation Age of Onset   Cancer Mother    Hypertension Mother      Exam     Skin: normal coloration and turgor, no rashes    Neurologic: oriented, normal   Extremities: normal strength, tone, and muscle mass, no deformities   HEENT PERRLA, extra ocular movement intact and sclera  clear, anicteric   Mouth/Teeth mucous membranes moist, pharynx normal without lesions   Neck Supple, thyroid is mildly enlarged, no palpable nodules    Cardiovascular: regular rate and rhythm, no murmurs or gallops   Respiratory:  appears well, vitals normal, no respiratory distress, acyanotic, normal RR, ear and throat exam is normal, neck free of mass or lymphadenopathy, chest clear, no wheezing, crepitations, rhonchi, normal symmetric air entry   Abdomen: soft, non-tender; bowel sounds normal; no masses,  no organomegaly   Urinary: bladder not palpable      Assessment:    Pregnancy: 2010 Patient Active Problem List   Diagnosis Date Noted   GBS (group B Streptococcus carrier), +RV culture, currently pregnant 08/26/2019   Hyperthyroidism affecting pregnancy 04/04/2019   Sickle cell trait (HCC) 03/21/2019   Supervision of high risk pregnancy, antepartum 03/07/2019   Chronic hypertension affecting pregnancy 03/07/2019   Asthma affecting pregnancy in first trimester 03/07/2019        Plan:    Supervision of High Risk Pregnancy  Initial labs drawn. TVUS today demonstrates good dating.  Prenatal vitamins. Problem list reviewed and updated. Genetic Screening discussed. Desires this, needs to  be ordered at next appointment    Ultrasound discussed; fetal survey: ordered.  Follow up in 4 weeks.   Chronic Hypertension Affecting Pregnancy -obtain baseline urine p:c, continue amlodipine. Start asa 81mg  at 12 weeks. Has BP cuff at home and rediscussed Babyscripts.   Hyperthyroidism affecting pregnancy  -follows with endocrinology, is currently off methimazole and will be evaluated for remission.    Short Interval between pregnancies  -discussed increased risks for maternal and fetal adverse events.   Sickle Cell Trait Urine culture q trimester; same father baby from 1st pregnancy; wil inquire about FOB status next visit   Asthma Stable, not on meds.    01/19/2020   01/21/2020, MD OB Family Medicine Fellow, [facility] for [facility], [facility]  .Attestation of Attending Supervision of Advanced Practitioner (CNM/NP): Evaluation and management procedures were performed by the Advanced Practitioner under my supervision and collaboration. I have reviewed the Advanced Practitioner's note and chart, and I agree with the management and plan.  [facility], MD 11:46 AM

## 2020-01-19 NOTE — Patient Instructions (Signed)
Please continue taking your amlodipine. Please start taking a baby aspirin at 12 weeks.    Hypertension During Pregnancy Hypertension is also called high blood pressure. High blood pressure means that the force of your blood moving in your body is too strong. It can cause problems for you and your baby. Different types of high blood pressure can happen during pregnancy. The types are:  High blood pressure before you got pregnant. This is called chronic hypertension.  This can continue during your pregnancy. Your doctor will want to keep checking your blood pressure. You may need medicine to keep your blood pressure under control while you are pregnant. You will need follow-up visits after you have your baby.  High blood pressure that goes up during pregnancy when it was normal before. This is called gestational hypertension. It will usually get better after you have your baby, but your doctor will need to watch your blood pressure to make sure that it is getting better.  Very high blood pressure during pregnancy. This is called preeclampsia. Very high blood pressure is an emergency that needs to be checked and treated right away.  You may develop very high blood pressure after giving birth. This is called postpartum preeclampsia. This usually occurs within 48 hours after childbirth but may occur up to 6 weeks after giving birth. This is rare. How does this affect me? If you have high blood pressure during pregnancy, you have a higher chance of developing high blood pressure:  As you get older.  If you get pregnant again. In some cases, high blood pressure during pregnancy can cause:  Stroke.  Heart attack.  Damage to the kidneys, lungs, or liver.  Preeclampsia.  Jerky movements you cannot control (convulsions or seizures).  Problems with the placenta. How does this affect my baby? Your baby may:  Be born early.  Not weigh as much as he or she should.  Not handle labor well,  leading to a c-section birth. What are the risks?  Having high blood pressure during a past pregnancy.  Being overweight.  Being 9 years old or older.  Being pregnant for the first time.  Being pregnant with more than one baby.  Becoming pregnant using fertility methods, such as IVF.  Having other problems, such as diabetes, or kidney disease.  Having family members who have high blood pressure. What can I do to lower my risk?   Keep a healthy weight.  Eat a healthy diet.  Follow what your doctor tells you about treating any medical problems that you had before becoming pregnant. It is very important to go to all of your doctor visits. Your doctor will check your blood pressure and make sure that your pregnancy is progressing as it should. Treatment should start early if a problem is found. How is this treated? Treatment for high blood pressure during pregnancy can differ depending on the type of high blood pressure you have and how serious it is.  You may need to take blood pressure medicine.  If you have been taking medicine for your blood pressure, you may need to change the medicine during pregnancy if it is not safe for your baby.  If your doctor thinks that you could get very high blood pressure, he or she may tell you to take a low-dose aspirin during your pregnancy.  If you have very high blood pressure, you may need to stay in the hospital so you and your baby can be watched closely. You may  also need to take medicine to lower your blood pressure. This medicine may be given by mouth or through an IV tube.  In some cases, if your condition gets worse, you may need to have your baby early. Follow these instructions at home: Eating and drinking   Drink enough fluid to keep your pee (urine) pale yellow.  Avoid caffeine. Lifestyle  Do not use any products that contain nicotine or tobacco, such as cigarettes, e-cigarettes, and chewing tobacco. If you need help  quitting, ask your doctor.  Do not use alcohol or drugs.  Avoid stress.  Rest and get plenty of sleep.  Regular exercise can help. Ask your doctor what kinds of exercise are best for you. General instructions  Take over-the-counter and prescription medicines only as told by your doctor.  Keep all prenatal and follow-up visits as told by your doctor. This is important. Contact a doctor if:  You have symptoms that your doctor told you to watch for, such as: ? Headaches. ? Nausea. ? Vomiting. ? Belly (abdominal) pain. ? Dizziness. ? Light-headedness. Get help right away if:  You have: ? Very bad belly pain that does not get better with treatment. ? A very bad headache that does not get better. ? Vomiting that does not get better. ? Sudden, fast weight gain. ? Sudden swelling in your hands, ankles, or face. ? Bleeding from your vagina. ? Blood in your pee. ? Blurry vision. ? Double vision. ? Shortness of breath. ? Chest pain. ? Weakness on one side of your body. ? Trouble talking.  Your baby is not moving as much as usual. Summary  High blood pressure is also called hypertension.  High blood pressure means that the force of your blood moving in your body is too strong.  High blood pressure can cause problems for you and your baby.  Keep all follow-up visits as told by your doctor. This is important. This information is not intended to replace advice given to you by your health care provider. Make sure you discuss any questions you have with your health care provider. Document Revised: 09/30/2018 Document Reviewed: 07/06/2018 Elsevier Patient Education  2020 [employer].

## 2020-01-20 LAB — COMPREHENSIVE METABOLIC PANEL
ALT: 20 IU/L (ref 0–32)
AST: 22 IU/L (ref 0–40)
Albumin/Globulin Ratio: 1.3 (ref 1.2–2.2)
Albumin: 4.3 g/dL (ref 3.9–5.0)
Alkaline Phosphatase: 53 IU/L (ref 48–121)
BUN/Creatinine Ratio: 8 — ABNORMAL LOW (ref 9–23)
BUN: 6 mg/dL (ref 6–20)
Bilirubin Total: 0.9 mg/dL (ref 0.0–1.2)
CO2: 23 mmol/L (ref 20–29)
Calcium: 9.8 mg/dL (ref 8.7–10.2)
Chloride: 98 mmol/L (ref 96–106)
Creatinine, Ser: 0.77 mg/dL (ref 0.57–1.00)
GFR calc Af Amer: 121 mL/min/{1.73_m2} (ref >59)
GFR calc non Af Amer: 105 mL/min/{1.73_m2} (ref >59)
Globulin, Total: 3.2 g/dL (ref 1.5–4.5)
Glucose: 72 mg/dL (ref 65–99)
Potassium: 4.2 mmol/L (ref 3.5–5.2)
Sodium: 132 mmol/L — ABNORMAL LOW (ref 134–144)
Total Protein: 7.5 g/dL (ref 6.0–8.5)

## 2020-01-20 LAB — CBC/D/PLT+RPR+RH+ABO+RUB AB...
Antibody Screen: NEGATIVE
Basophils Absolute: 0 10*3/uL (ref 0.0–0.2)
Basos: 1 %
EOS (ABSOLUTE): 0.1 10*3/uL (ref 0.0–0.4)
Eos: 1 %
HCV Ab: <0.1 s/co ratio (ref 0.0–0.9)
HIV Screen 4th Generation wRfx: NONREACTIVE
Hematocrit: 39.2 % (ref 34.0–46.6)
Hemoglobin: 13.7 g/dL (ref 11.1–15.9)
Hepatitis B Surface Ag: NEGATIVE
Immature Grans (Abs): 0 10*3/uL (ref 0.0–0.1)
Immature Granulocytes: 0 %
Lymphocytes Absolute: 2 10*3/uL (ref 0.7–3.1)
Lymphs: 34 %
MCH: 28.8 pg (ref 26.6–33.0)
MCHC: 34.9 g/dL (ref 31.5–35.7)
MCV: 82 fL (ref 79–97)
Monocytes Absolute: 0.3 10*3/uL (ref 0.1–0.9)
Monocytes: 6 %
Neutrophils Absolute: 3.4 10*3/uL (ref 1.4–7.0)
Neutrophils: 58 %
Platelets: 266 10*3/uL (ref 150–450)
RBC: 4.76 x10E6/uL (ref 3.77–5.28)
RDW: 14.4 % (ref 11.7–15.4)
RPR Ser Ql: NONREACTIVE
Rh Factor: POSITIVE
Rubella Antibodies, IGG: 1 index (ref >0.99)
WBC: 5.9 10*3/uL (ref 3.4–10.8)

## 2020-01-20 LAB — HEMOGLOBIN A1C
Est. average glucose Bld gHb Est-mCnc: 103 mg/dL
Hgb A1c MFr Bld: 5.2 % (ref 4.8–5.6)

## 2020-01-20 LAB — PROTEIN / CREATININE RATIO, URINE
Creatinine, Urine: 82.5 mg/dL
Protein, Ur: 7.2 mg/dL
Protein/Creat Ratio: 87 mg/g creat (ref 0–200)

## 2020-01-20 LAB — HCV INTERPRETATION

## 2020-01-21 LAB — CULTURE, OB URINE

## 2020-01-21 LAB — URINE CULTURE, OB REFLEX

## 2020-01-22 DIAGNOSIS — Z419 Encounter for procedure for purposes other than remedying health state, unspecified: Secondary | ICD-10-CM | POA: Diagnosis not present

## 2020-01-23 DIAGNOSIS — O0990 Supervision of high risk pregnancy, unspecified, unspecified trimester: Secondary | ICD-10-CM

## 2020-01-31 ENCOUNTER — Other Ambulatory Visit: Payer: Self-pay | Admitting: Family Medicine

## 2020-02-02 ENCOUNTER — Other Ambulatory Visit: Payer: Self-pay | Admitting: Lactation Services

## 2020-02-02 MED ORDER — PRENATAL PLUS 27-1 MG PO TABS: 1.0000 | ORAL_TABLET | Freq: Every day | ORAL | 11 refills | Status: DC

## 2020-02-07 ENCOUNTER — Other Ambulatory Visit: Payer: Medicaid Other

## 2020-02-07 ENCOUNTER — Other Ambulatory Visit: Payer: Self-pay

## 2020-02-07 DIAGNOSIS — Z20822 Contact with and (suspected) exposure to covid-19: Secondary | ICD-10-CM | POA: Diagnosis not present

## 2020-02-08 LAB — SARS-COV-2, NAA 2 DAY TAT

## 2020-02-08 LAB — NOVEL CORONAVIRUS, NAA: SARS-CoV-2, NAA: NOT DETECTED

## 2020-02-13 ENCOUNTER — Telehealth: Payer: Self-pay | Admitting: Family Medicine

## 2020-02-13 NOTE — Telephone Encounter (Signed)
Want to Discuss her mediations with someone

## 2020-02-14 ENCOUNTER — Telehealth: Payer: Self-pay

## 2020-02-14 NOTE — Telephone Encounter (Signed)
Patient wanted a call back to discuss medications, advised that she should only take one 81 mg Asprin per day. Patient verbalized understanding and ended the call.

## 2020-02-16 ENCOUNTER — Encounter: Payer: Self-pay | Admitting: Obstetrics and Gynecology

## 2020-02-16 ENCOUNTER — Other Ambulatory Visit: Payer: Self-pay

## 2020-02-16 ENCOUNTER — Ambulatory Visit (INDEPENDENT_AMBULATORY_CARE_PROVIDER_SITE_OTHER): Payer: Medicaid Other | Admitting: Obstetrics and Gynecology

## 2020-02-16 VITALS — BP 132/84 | HR 94 | Wt 225.7 lb

## 2020-02-16 DIAGNOSIS — J45909 Unspecified asthma, uncomplicated: Secondary | ICD-10-CM

## 2020-02-16 DIAGNOSIS — O99282 Endocrine, nutritional and metabolic diseases complicating pregnancy, second trimester: Secondary | ICD-10-CM

## 2020-02-16 DIAGNOSIS — O99511 Diseases of the respiratory system complicating pregnancy, first trimester: Secondary | ICD-10-CM

## 2020-02-16 DIAGNOSIS — O99281 Endocrine, nutritional and metabolic diseases complicating pregnancy, first trimester: Secondary | ICD-10-CM | POA: Diagnosis not present

## 2020-02-16 DIAGNOSIS — O10919 Unspecified pre-existing hypertension complicating pregnancy, unspecified trimester: Secondary | ICD-10-CM

## 2020-02-16 DIAGNOSIS — O99820 Streptococcus B carrier state complicating pregnancy: Secondary | ICD-10-CM

## 2020-02-16 DIAGNOSIS — O0990 Supervision of high risk pregnancy, unspecified, unspecified trimester: Secondary | ICD-10-CM

## 2020-02-16 DIAGNOSIS — E0590 Thyrotoxicosis, unspecified without thyrotoxic crisis or storm: Secondary | ICD-10-CM

## 2020-02-16 DIAGNOSIS — D573 Sickle-cell trait: Secondary | ICD-10-CM

## 2020-02-16 NOTE — Progress Notes (Signed)
Subjective:  Kathleen [name] is a 29 y.o. G4P1021 at [redacted]w[redacted]d being seen today for ongoing prenatal care.  She is currently monitored for the following issues for this high-risk pregnancy and has Supervision of high risk pregnancy, antepartum; Chronic hypertension affecting pregnancy; Asthma affecting pregnancy in first trimester; Sickle cell trait (HCC); Hyperthyroidism affecting pregnancy; GBS (group B Streptococcus carrier), +RV culture, currently pregnant; and Short interval between pregnancies affecting pregnancy in first trimester, antepartum on their problem list.  Patient reports no complaints.  Contractions: Not present. Vag. Bleeding: None.   . Denies leaking of fluid.   The following portions of the patient's history were reviewed and updated as appropriate: allergies, current medications, past family history, past medical history, past social history, past surgical history and problem list. Problem list updated.  Objective:   Vitals:   02/16/20 1024  BP: 132/84  Pulse: 94  Weight: 102.4 kg    Fetal Status:           General:  Alert, oriented and cooperative. Patient is in no acute distress.  Skin: Skin is warm and dry. No rash noted.   Cardiovascular: Normal heart rate noted  Respiratory: Normal respiratory effort, no problems with respiration noted  Abdomen: Soft, gravid, appropriate for gestational age. Pain/Pressure: Present     Pelvic:  Cervical exam performed        Extremities: Normal range of motion.  Edema: None  Mental Status: Normal mood and affect. Normal behavior. Normal judgment and thought content.   Urinalysis:      Assessment and Plan:  Pregnancy: G4P1021 at [redacted]w[redacted]d  1. Supervision of high risk pregnancy, antepartum Stable - Genetic Screening - [place] MFM OB DETAIL +14 WK  2. Chronic hypertension affecting pregnancy BP stable To continue with home monitoring Norvasc and BASA qd - [place] MFM OB DETAIL +14 WK  3. Hyperthyroidism affecting pregnancy in second  trimester To see endocrine today  4. Sickle cell trait (HCC) UC q trimester  5. GBS (group B Streptococcus carrier), +RV culture, currently pregnant Tx while in labor  6. Asthma affecting pregnancy in first trimester Stable  Preterm labor symptoms and general obstetric precautions including but not limited to vaginal bleeding, contractions, leaking of fluid and fetal movement were reviewed in detail with the patient. Please refer to After Visit Summary for other counseling recommendations.  No follow-ups on file.   Hermina Staggers, MD

## 2020-02-16 NOTE — Patient Instructions (Signed)
First Trimester of Pregnancy The first trimester of pregnancy is from week 1 until the end of week 13 (months 1 through 3). A week after a sperm fertilizes an egg, the egg will implant on the wall of the uterus. This embryo will begin to develop into a baby. Genes from you and your partner will form the baby. The female genes will determine whether the baby will be a boy or a girl. At 6-8 weeks, the eyes and face will be formed, and the heartbeat can be seen on ultrasound. At the end of 12 weeks, all the baby's organs will be formed. Now that you are pregnant, you will want to do everything you can to have a healthy baby. Two of the most important things are to get good prenatal care and to follow your health care provider's instructions. Prenatal care is all the medical care you receive before the baby's birth. This care will help prevent, find, and treat any problems during the pregnancy and childbirth. Body changes during your first trimester Your body goes through many changes during pregnancy. The changes vary from woman to woman.  You may gain or lose a couple of pounds at first.  You may feel sick to your stomach (nauseous) and you may throw up (vomit). If the vomiting is uncontrollable, call your health care provider.  You may tire easily.  You may develop headaches that can be relieved by medicines. All medicines should be approved by your health care provider.  You may urinate more often. Painful urination may mean you have a bladder infection.  You may develop heartburn as a result of your pregnancy.  You may develop constipation because certain hormones are causing the muscles that push stool through your intestines to slow down.  You may develop hemorrhoids or swollen veins (varicose veins).  Your breasts may begin to grow larger and become tender. Your nipples may stick out more, and the tissue that surrounds them (areola) may become darker.  Your gums may bleed and may be  sensitive to brushing and flossing.  Dark spots or blotches (chloasma, mask of pregnancy) may develop on your face. This will likely fade after the baby is born.  Your menstrual periods will stop.  You may have a loss of appetite.  You may develop cravings for certain kinds of food.  You may have changes in your emotions from day to day, such as being excited to be pregnant or being concerned that something may go wrong with the pregnancy and baby.  You may have more vivid and strange dreams.  You may have changes in your hair. These can include thickening of your hair, rapid growth, and changes in texture. Some women also have hair loss during or after pregnancy, or hair that feels dry or thin. Your hair will most likely return to normal after your baby is born. What to expect at prenatal visits During a routine prenatal visit:  You will be weighed to make sure you and the baby are growing normally.  Your blood pressure will be taken.  Your abdomen will be measured to track your baby's growth.  The fetal heartbeat will be listened to between weeks 10 and 14 of your pregnancy.  Test results from any previous visits will be discussed. Your health care provider may ask you:  How you are feeling.  If you are feeling the baby move.  If you have had any abnormal symptoms, such as leaking fluid, bleeding, severe headaches, or abdominal   cramping.  If you are using any tobacco products, including cigarettes, chewing tobacco, and electronic cigarettes.  If you have any questions. Other tests that may be performed during your first trimester include:  Blood tests to find your blood type and to check for the presence of any previous infections. The tests will also be used to check for low iron levels (anemia) and protein on red blood cells (Rh antibodies). Depending on your risk factors, or if you previously had diabetes during pregnancy, you may have tests to check for high blood sugar  that affects pregnant women (gestational diabetes).  Urine tests to check for infections, diabetes, or protein in the urine.  An ultrasound to confirm the proper growth and development of the baby.  Fetal screens for spinal cord problems (spina bifida) and Down syndrome.  HIV (human immunodeficiency virus) testing. Routine prenatal testing includes screening for HIV, unless you choose not to have this test.  You may need other tests to make sure you and the baby are doing well. Follow these instructions at home: Medicines  Follow your health care provider's instructions regarding medicine use. Specific medicines may be either safe or unsafe to take during pregnancy.  Take a prenatal vitamin that contains at least 600 micrograms (mcg) of folic acid.  If you develop constipation, try taking a stool softener if your health care provider approves. Eating and drinking   Eat a balanced diet that includes fresh fruits and vegetables, whole grains, good sources of protein such as meat, eggs, or tofu, and low-fat dairy. Your health care provider will help you determine the amount of weight gain that is right for you.  Avoid raw meat and uncooked cheese. These carry germs that can cause birth defects in the baby.  Eating four or five small meals rather than three large meals a day may help relieve nausea and vomiting. If you start to feel nauseous, eating a few soda crackers can be helpful. Drinking liquids between meals, instead of during meals, also seems to help ease nausea and vomiting.  Limit foods that are high in fat and processed sugars, such as fried and sweet foods.  To prevent constipation: ? Eat foods that are high in fiber, such as fresh fruits and vegetables, whole grains, and beans. ? Drink enough fluid to keep your urine clear or pale yellow. Activity  Exercise only as directed by your health care provider. Most women can continue their usual exercise routine during  pregnancy. Try to exercise for 30 minutes at least 5 days a week. Exercising will help you: ? Control your weight. ? Stay in shape. ? Be prepared for labor and delivery.  Experiencing pain or cramping in the lower abdomen or lower back is a good sign that you should stop exercising. Check with your health care provider before continuing with normal exercises.  Try to avoid standing for long periods of time. Move your legs often if you must stand in one place for a long time.  Avoid heavy lifting.  Wear low-heeled shoes and practice good posture.  You may continue to have sex unless your health care provider tells you not to. Relieving pain and discomfort  Wear a good support bra to relieve breast tenderness.  Take warm sitz baths to soothe any pain or discomfort caused by hemorrhoids. Use hemorrhoid cream if your health care provider approves.  Rest with your legs elevated if you have leg cramps or low back pain.  If you develop varicose veins in   your legs, wear support hose. Elevate your feet for 15 minutes, 3-4 times a day. Limit salt in your diet. Prenatal care  Schedule your prenatal visits by the twelfth week of pregnancy. They are usually scheduled monthly at first, then more often in the last 2 months before delivery.  Write down your questions. Take them to your prenatal visits.  Keep all your prenatal visits as told by your health care provider. This is important. Safety  Wear your seat belt at all times when driving.  Make a list of emergency phone numbers, including numbers for family, friends, the hospital, and police and fire departments. General instructions  Ask your health care provider for a referral to a local prenatal education class. Begin classes no later than the beginning of month 6 of your pregnancy.  Ask for help if you have counseling or nutritional needs during pregnancy. Your health care provider can offer advice or refer you to specialists for help  with various needs.  Do not use hot tubs, steam rooms, or saunas.  Do not douche or use tampons or scented sanitary pads.  Do not cross your legs for long periods of time.  Avoid cat litter boxes and soil used by cats. These carry germs that can cause birth defects in the baby and possibly loss of the fetus by miscarriage or stillbirth.  Avoid all smoking, herbs, alcohol, and medicines not prescribed by your health care provider. Chemicals in these products affect the formation and growth of the baby.  Do not use any products that contain nicotine or tobacco, such as cigarettes and e-cigarettes. If you need help quitting, ask your health care provider. You may receive counseling support and other resources to help you quit.  Schedule a dentist appointment. At home, brush your teeth with a soft toothbrush and be gentle when you floss. Contact a health care provider if:  You have dizziness.  You have mild pelvic cramps, pelvic pressure, or nagging pain in the abdominal area.  You have persistent nausea, vomiting, or diarrhea.  You have a bad smelling vaginal discharge.  You have pain when you urinate.  You notice increased swelling in your face, hands, legs, or ankles.  You are exposed to fifth disease or chickenpox.  You are exposed to German measles (rubella) and have never had it. Get help right away if:  You have a fever.  You are leaking fluid from your vagina.  You have spotting or bleeding from your vagina.  You have severe abdominal cramping or pain.  You have rapid weight gain or loss.  You vomit blood or material that looks like coffee grounds.  You develop a severe headache.  You have shortness of breath.  You have any kind of trauma, such as from a fall or a car accident. Summary  The first trimester of pregnancy is from week 1 until the end of week 13 (months 1 through 3).  Your body goes through many changes during pregnancy. The changes vary from  woman to woman.  You will have routine prenatal visits. During those visits, your health care provider will examine you, discuss any test results you may have, and talk with you about how you are feeling. This information is not intended to replace advice given to you by your health care provider. Make sure you discuss any questions you have with your health care provider. Document Revised: 05/22/2017 Document Reviewed: 05/21/2016 Elsevier Patient Education  2020 [employer]  

## 2020-02-20 NOTE — Addendum Note (Signed)
Addended by: Jill Side on: 02/20/2020 12:38 PM   Modules accepted: Orders

## 2020-02-22 DIAGNOSIS — Z419 Encounter for procedure for purposes other than remedying health state, unspecified: Secondary | ICD-10-CM | POA: Diagnosis not present

## 2020-02-23 ENCOUNTER — Encounter: Payer: Self-pay | Admitting: General Practice

## 2020-02-23 ENCOUNTER — Ambulatory Visit: Payer: Medicaid Other | Admitting: Internal Medicine

## 2020-03-07 ENCOUNTER — Telehealth (INDEPENDENT_AMBULATORY_CARE_PROVIDER_SITE_OTHER): Payer: Medicaid Other | Admitting: Lactation Services

## 2020-03-07 DIAGNOSIS — D573 Sickle-cell trait: Secondary | ICD-10-CM

## 2020-03-07 NOTE — Telephone Encounter (Signed)
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 Pt reports FOB tested with last pregnancy and negative. Older child is also negative per mom.   Mom reports she spoke with Avelina Laine previously and is comfortable with the information shared.   She asked if infant would be tested like her last daughter, informed her that since infant [place] she will be tested on her PKU and pediatrician would be notified.   Patient to call with any questions or concerns as needed.

## 2020-03-14 ENCOUNTER — Other Ambulatory Visit: Payer: Self-pay

## 2020-03-14 ENCOUNTER — Ambulatory Visit (INDEPENDENT_AMBULATORY_CARE_PROVIDER_SITE_OTHER): Payer: Medicaid Other | Admitting: Obstetrics and Gynecology

## 2020-03-14 ENCOUNTER — Encounter: Payer: Self-pay | Admitting: Obstetrics and Gynecology

## 2020-03-14 VITALS — BP 138/75 | HR 100 | Wt 228.1 lb

## 2020-03-14 DIAGNOSIS — Z23 Encounter for immunization: Secondary | ICD-10-CM | POA: Diagnosis not present

## 2020-03-14 DIAGNOSIS — E0590 Thyrotoxicosis, unspecified without thyrotoxic crisis or storm: Secondary | ICD-10-CM

## 2020-03-14 DIAGNOSIS — O10919 Unspecified pre-existing hypertension complicating pregnancy, unspecified trimester: Secondary | ICD-10-CM

## 2020-03-14 DIAGNOSIS — O0990 Supervision of high risk pregnancy, unspecified, unspecified trimester: Secondary | ICD-10-CM | POA: Diagnosis not present

## 2020-03-14 DIAGNOSIS — O99282 Endocrine, nutritional and metabolic diseases complicating pregnancy, second trimester: Secondary | ICD-10-CM

## 2020-03-14 DIAGNOSIS — O99820 Streptococcus B carrier state complicating pregnancy: Secondary | ICD-10-CM

## 2020-03-14 DIAGNOSIS — D573 Sickle-cell trait: Secondary | ICD-10-CM

## 2020-03-14 NOTE — Progress Notes (Signed)
Subjective:  Kathleen [name] is a 29 y.o. G4P1021 at [redacted]w[redacted]d being seen today for ongoing prenatal care.  She is currently monitored for the following issues for this high-risk pregnancy and has Supervision of high risk pregnancy, antepartum; Chronic hypertension affecting pregnancy; Asthma affecting pregnancy in first trimester; Sickle cell trait (HCC); Hyperthyroidism affecting pregnancy; GBS (group B Streptococcus carrier), +RV culture, currently pregnant; and Short interval between pregnancies affecting pregnancy in first trimester, antepartum on their problem list.  Patient reports no complaints.  Contractions: Not present. Vag. Bleeding: None.  Movement: Present. Denies leaking of fluid.   The following portions of the patient's history were reviewed and updated as appropriate: allergies, current medications, past family history, past medical history, past social history, past surgical history and problem list. Problem list updated.  Objective:   Vitals:   03/14/20 0931  BP: 138/75  Pulse: 100  Weight: 228 lb 1.6 oz (103.5 kg)    Fetal Status: Fetal Heart Rate (bpm): 156   Movement: Present     General:  Alert, oriented and cooperative. Patient is in no acute distress.  Skin: Skin is warm and dry. No rash noted.   Cardiovascular: Normal heart rate noted  Respiratory: Normal respiratory effort, no problems with respiration noted  Abdomen: Soft, gravid, appropriate for gestational age. Pain/Pressure: Absent     Pelvic:  Cervical exam deferred        Extremities: Normal range of motion.  Edema: None  Mental Status: Normal mood and affect. Normal behavior. Normal judgment and thought content.   Urinalysis:      Assessment and Plan:  Pregnancy: G4P1021 at [redacted]w[redacted]d  1. Supervision of high risk pregnancy, antepartum Stable AFP and Flu vaccine today  2. Chronic hypertension affecting pregnancy BP stable Continue with Norvasc and qd BASA  3. Hyperthyroidism affecting pregnancy in  second trimester Has seen endocrine. Stable no meds F/U appt next month  4. GBS (group B Streptococcus carrier), +RV culture, currently pregnant Tx while in labor  5. Sickle cell trait (HCC) FOB negative  Preterm labor symptoms and general obstetric precautions including but not limited to vaginal bleeding, contractions, leaking of fluid and fetal movement were reviewed in detail with the patient. Please refer to After Visit Summary for other counseling recommendations.  Return in about 4 weeks (around 04/11/2020) for OB visit, virtual, MD provider.   Hermina Staggers, MD

## 2020-03-14 NOTE — Patient Instructions (Signed)

## 2020-03-23 DIAGNOSIS — Z419 Encounter for procedure for purposes other than remedying health state, unspecified: Secondary | ICD-10-CM | POA: Diagnosis not present

## 2020-04-06 ENCOUNTER — Other Ambulatory Visit: Payer: Self-pay | Admitting: *Deleted

## 2020-04-06 ENCOUNTER — Ambulatory Visit: Payer: Medicaid Other | Admitting: *Deleted

## 2020-04-06 ENCOUNTER — Ambulatory Visit: Payer: Medicaid Other | Attending: Obstetrics & Gynecology

## 2020-04-06 ENCOUNTER — Encounter: Payer: Self-pay | Admitting: *Deleted

## 2020-04-06 ENCOUNTER — Other Ambulatory Visit: Payer: Self-pay

## 2020-04-06 VITALS — BP 128/69 | HR 102

## 2020-04-06 DIAGNOSIS — O99212 Obesity complicating pregnancy, second trimester: Secondary | ICD-10-CM

## 2020-04-06 DIAGNOSIS — E669 Obesity, unspecified: Secondary | ICD-10-CM | POA: Diagnosis not present

## 2020-04-06 DIAGNOSIS — E0590 Thyrotoxicosis, unspecified without thyrotoxic crisis or storm: Secondary | ICD-10-CM | POA: Diagnosis not present

## 2020-04-06 DIAGNOSIS — O10919 Unspecified pre-existing hypertension complicating pregnancy, unspecified trimester: Secondary | ICD-10-CM | POA: Diagnosis not present

## 2020-04-06 DIAGNOSIS — O10912 Unspecified pre-existing hypertension complicating pregnancy, second trimester: Secondary | ICD-10-CM

## 2020-04-06 DIAGNOSIS — Z3A19 19 weeks gestation of pregnancy: Secondary | ICD-10-CM

## 2020-04-06 DIAGNOSIS — O99282 Endocrine, nutritional and metabolic diseases complicating pregnancy, second trimester: Secondary | ICD-10-CM | POA: Diagnosis not present

## 2020-04-06 DIAGNOSIS — I10 Essential (primary) hypertension: Secondary | ICD-10-CM

## 2020-04-06 DIAGNOSIS — Z148 Genetic carrier of other disease: Secondary | ICD-10-CM

## 2020-04-06 DIAGNOSIS — O99281 Endocrine, nutritional and metabolic diseases complicating pregnancy, first trimester: Secondary | ICD-10-CM | POA: Insufficient documentation

## 2020-04-06 DIAGNOSIS — D573 Sickle-cell trait: Secondary | ICD-10-CM | POA: Diagnosis not present

## 2020-04-09 ENCOUNTER — Telehealth (INDEPENDENT_AMBULATORY_CARE_PROVIDER_SITE_OTHER): Payer: Medicaid Other | Admitting: Lactation Services

## 2020-04-09 DIAGNOSIS — O0990 Supervision of high risk pregnancy, unspecified, unspecified trimester: Secondary | ICD-10-CM

## 2020-04-09 NOTE — Telephone Encounter (Signed)
Patient called in and reports some spotting that started today. She noted around 11 and only sees pink when wiping. She reports she had the same issue around 10 weeks.   She denies sexual intercourse recently and [place] last week was abdominal. She denies pain, contractions or any abnormal vaginal discharge with the exception of spotting. She denies feeling ill.   Patient has virtual appt on 10/20. She would prefer to come in to the office since she has been spotting, message to front office to change to in person visit.   Reviewed if after hours and bleeding like a period or having severe abdominal pain to seek care at the [facility], otherwise call the office. Patient voiced understanding.

## 2020-04-12 ENCOUNTER — Telehealth (INDEPENDENT_AMBULATORY_CARE_PROVIDER_SITE_OTHER): Payer: Medicaid Other | Admitting: Obstetrics and Gynecology

## 2020-04-12 ENCOUNTER — Encounter: Payer: Self-pay | Admitting: Obstetrics and Gynecology

## 2020-04-12 VITALS — BP 129/68 | HR 97 | Wt 235.7 lb

## 2020-04-12 DIAGNOSIS — E0590 Thyrotoxicosis, unspecified without thyrotoxic crisis or storm: Secondary | ICD-10-CM

## 2020-04-12 DIAGNOSIS — O99282 Endocrine, nutritional and metabolic diseases complicating pregnancy, second trimester: Secondary | ICD-10-CM

## 2020-04-12 DIAGNOSIS — O10912 Unspecified pre-existing hypertension complicating pregnancy, second trimester: Secondary | ICD-10-CM

## 2020-04-12 DIAGNOSIS — O10919 Unspecified pre-existing hypertension complicating pregnancy, unspecified trimester: Secondary | ICD-10-CM

## 2020-04-12 DIAGNOSIS — D573 Sickle-cell trait: Secondary | ICD-10-CM

## 2020-04-12 DIAGNOSIS — O0990 Supervision of high risk pregnancy, unspecified, unspecified trimester: Secondary | ICD-10-CM

## 2020-04-12 DIAGNOSIS — Z3A20 20 weeks gestation of pregnancy: Secondary | ICD-10-CM

## 2020-04-12 DIAGNOSIS — O99820 Streptococcus B carrier state complicating pregnancy: Secondary | ICD-10-CM

## 2020-04-12 DIAGNOSIS — O0992 Supervision of high risk pregnancy, unspecified, second trimester: Secondary | ICD-10-CM

## 2020-04-12 NOTE — Progress Notes (Signed)
Subjective:  Kathleen [name] is a 29 y.o. G4P1021 at [redacted]w[redacted]d being seen today for ongoing prenatal care.  She is currently monitored for the following issues for this high-risk pregnancy and has Supervision of high risk pregnancy, antepartum; Chronic hypertension affecting pregnancy; Asthma affecting pregnancy in first trimester; Sickle cell trait (HCC); Hyperthyroidism affecting pregnancy; GBS (group B Streptococcus carrier), +RV culture, currently pregnant; and Short interval between pregnancies affecting pregnancy in first trimester, antepartum on their problem list.  Patient reports had some spotting after IC a few days ago. Has now resolved.  Contractions: Not present. Vag. Bleeding: None.  Movement: Present. Denies leaking of fluid.   The following portions of the patient's history were reviewed and updated as appropriate: allergies, current medications, past family history, past medical history, past social history, past surgical history and problem list. Problem list updated.  Objective:   Vitals:   04/12/20 1119  BP: 129/68  Pulse: 97  Weight: 235 lb 11.2 oz (106.9 kg)    Fetal Status: Fetal Heart Rate (bpm): 154   Movement: Present     General:  Alert, oriented and cooperative. Patient is in no acute distress.  Skin: Skin is warm and dry. No rash noted.   Cardiovascular: Normal heart rate noted  Respiratory: Normal respiratory effort, no problems with respiration noted  Abdomen: Soft, gravid, appropriate for gestational age. Pain/Pressure: Absent     Pelvic:  Cervical exam deferred        Extremities: Normal range of motion.  Edema: None  Mental Status: Normal mood and affect. Normal behavior. Normal judgment and thought content.   Urinalysis:      Assessment and Plan:  Pregnancy: G4P1021 at [redacted]w[redacted]d  1. Supervision of high risk pregnancy, antepartum Stable   2. Chronic hypertension affecting pregnancy BP stable Continue with current regiment Growth scan next month  3.  Sickle cell trait (HCC) FOB negative  4. Hyperthyroidism affecting pregnancy in second trimester Followed by endocrine No meds at present  5. GBS (group B Streptococcus carrier), +RV culture, currently pregnant Tx while in labor  Preterm labor symptoms and general obstetric precautions including but not limited to vaginal bleeding, contractions, leaking of fluid and fetal movement were reviewed in detail with the patient. Please refer to After Visit Summary for other counseling recommendations.  Return in about 4 weeks (around 05/10/2020) for OB visit, face to face, MD only.   Hermina Staggers, MD

## 2020-04-12 NOTE — Patient Instructions (Signed)

## 2020-04-23 DIAGNOSIS — Z419 Encounter for procedure for purposes other than remedying health state, unspecified: Secondary | ICD-10-CM | POA: Diagnosis not present

## 2020-05-03 ENCOUNTER — Ambulatory Visit: Payer: Medicaid Other

## 2020-05-03 ENCOUNTER — Encounter: Payer: Medicaid Other | Admitting: Obstetrics & Gynecology

## 2020-05-10 ENCOUNTER — Telehealth: Payer: Self-pay | Admitting: General Practice

## 2020-05-10 NOTE — Telephone Encounter (Signed)
Patient called into front office stating she contacted her thyroid doctor this morning and was told to reach out to [place] instead. Patient reports for the past 3 days she has been experiencing fast heart beat, swollen feet, seeing black dots, & dizziness. Patient feels this is related to her thyroid and not her blood pressure as she has had this issue before. Per chart review, patient has CHTN & hyperthyroidism. She does take Norvasc 5mg  daily. Blood pressure at home today is 131/79 with pulse of 98. Discussed with Dr who advises patient reach out to Endocrinology as this seems related to her thyroid. Patient does have appt in office tomorrow for ob visit. Patient verbalized understanding & states she is going to have transportation issues tomorrow. Provided [place]'s transportation number and encouraged her to call. Advised that we can do a virtual visit but with everything going on it is very important she come into the office. Patient verbalized understanding.

## 2020-05-11 ENCOUNTER — Other Ambulatory Visit: Payer: Self-pay

## 2020-05-11 ENCOUNTER — Ambulatory Visit (INDEPENDENT_AMBULATORY_CARE_PROVIDER_SITE_OTHER): Payer: Medicaid Other | Admitting: Obstetrics and Gynecology

## 2020-05-11 ENCOUNTER — Encounter: Payer: Self-pay | Admitting: Obstetrics and Gynecology

## 2020-05-11 VITALS — BP 138/81 | HR 99 | Wt 242.6 lb

## 2020-05-11 DIAGNOSIS — D573 Sickle-cell trait: Secondary | ICD-10-CM

## 2020-05-11 DIAGNOSIS — O99820 Streptococcus B carrier state complicating pregnancy: Secondary | ICD-10-CM

## 2020-05-11 DIAGNOSIS — O0990 Supervision of high risk pregnancy, unspecified, unspecified trimester: Secondary | ICD-10-CM

## 2020-05-11 DIAGNOSIS — E0590 Thyrotoxicosis, unspecified without thyrotoxic crisis or storm: Secondary | ICD-10-CM

## 2020-05-11 DIAGNOSIS — O99282 Endocrine, nutritional and metabolic diseases complicating pregnancy, second trimester: Secondary | ICD-10-CM

## 2020-05-11 DIAGNOSIS — O10919 Unspecified pre-existing hypertension complicating pregnancy, unspecified trimester: Secondary | ICD-10-CM

## 2020-05-11 NOTE — Patient Instructions (Signed)

## 2020-05-11 NOTE — Progress Notes (Signed)
Subjective:  Kathleen [name] is a 29 y.o. G4P1021 at [redacted]w[redacted]d being seen today for ongoing prenatal care.  She is currently monitored for the following issues for this high-risk pregnancy and has Supervision of high risk pregnancy, antepartum; Chronic hypertension affecting pregnancy; Asthma affecting pregnancy in first trimester; Sickle cell trait (HCC); Hyperthyroidism affecting pregnancy; GBS (group B Streptococcus carrier), +RV culture, currently pregnant; and Short interval between pregnancies affecting pregnancy in first trimester, antepartum on their problem list.  Patient reports no complaints.  Contractions: Not present. Vag. Bleeding: None.  Movement: Present. Denies leaking of fluid.   The following portions of the patient's history were reviewed and updated as appropriate: allergies, current medications, past family history, past medical history, past social history, past surgical history and problem list. Problem list updated.  Objective:   Vitals:   05/11/20 1106  BP: 138/81  Pulse: 99  Weight: 242 lb 9.6 oz (110 kg)    Fetal Status: Fetal Heart Rate (bpm): 156   Movement: Present     General:  Alert, oriented and cooperative. Patient is in no acute distress.  Skin: Skin is warm and dry. No rash noted.   Cardiovascular: Normal heart rate noted  Respiratory: Normal respiratory effort, no problems with respiration noted  Abdomen: Soft, gravid, appropriate for gestational age. Pain/Pressure: Absent     Pelvic:  Cervical exam deferred        Extremities: Normal range of motion.  Edema: Trace  Mental Status: Normal mood and affect. Normal behavior. Normal judgment and thought content.   Urinalysis:      Assessment and Plan:  Pregnancy: G4P1021 at [redacted]w[redacted]d  1. Supervision of high risk pregnancy, antepartum Stable Glucola next visit Growth scan Monday  2. Chronic hypertension affecting pregnancy BP stable Continue with Norvasc Growth scan Monday  3. Sickle cell trait  (HCC) FOB negative  4. GBS (group B Streptococcus carrier), +RV culture, currently pregnant Tx while in labor   5. Hyperthyroidism affecting pregnancy in second trimester Labs today per endocrine's request  Preterm labor symptoms and general obstetric precautions including but not limited to vaginal bleeding, contractions, leaking of fluid and fetal movement were reviewed in detail with the patient. Please refer to After Visit Summary for other counseling recommendations.  Return in about 4 weeks (around 06/08/2020) for OB visit, face to face, MD only, glucola.   Hermina Staggers, MD

## 2020-05-12 LAB — THYROID PANEL WITH TSH
Free Thyroxine Index: 1 — ABNORMAL LOW (ref 1.2–4.9)
T3 Uptake Ratio: 9 % — ABNORMAL LOW (ref 24–39)
T4, Total: 11 ug/dL (ref 4.5–12.0)
TSH: 0.682 u[IU]/mL (ref 0.450–4.500)

## 2020-05-14 ENCOUNTER — Ambulatory Visit: Payer: Medicaid Other

## 2020-05-23 DIAGNOSIS — Z419 Encounter for procedure for purposes other than remedying health state, unspecified: Secondary | ICD-10-CM | POA: Diagnosis not present

## 2020-05-29 ENCOUNTER — Other Ambulatory Visit: Payer: Self-pay

## 2020-05-29 ENCOUNTER — Ambulatory Visit: Payer: Medicaid Other | Attending: Obstetrics

## 2020-05-29 ENCOUNTER — Ambulatory Visit: Payer: Medicaid Other | Admitting: *Deleted

## 2020-05-29 ENCOUNTER — Encounter: Payer: Self-pay | Admitting: *Deleted

## 2020-05-29 VITALS — BP 127/71 | HR 90

## 2020-05-29 DIAGNOSIS — Z148 Genetic carrier of other disease: Secondary | ICD-10-CM

## 2020-05-29 DIAGNOSIS — Z362 Encounter for other antenatal screening follow-up: Secondary | ICD-10-CM

## 2020-05-29 DIAGNOSIS — Z3A26 26 weeks gestation of pregnancy: Secondary | ICD-10-CM | POA: Diagnosis not present

## 2020-05-29 DIAGNOSIS — O99282 Endocrine, nutritional and metabolic diseases complicating pregnancy, second trimester: Secondary | ICD-10-CM

## 2020-05-29 DIAGNOSIS — O10912 Unspecified pre-existing hypertension complicating pregnancy, second trimester: Secondary | ICD-10-CM

## 2020-05-29 DIAGNOSIS — E0590 Thyrotoxicosis, unspecified without thyrotoxic crisis or storm: Secondary | ICD-10-CM | POA: Diagnosis not present

## 2020-05-29 DIAGNOSIS — O10919 Unspecified pre-existing hypertension complicating pregnancy, unspecified trimester: Secondary | ICD-10-CM | POA: Diagnosis not present

## 2020-05-29 DIAGNOSIS — E669 Obesity, unspecified: Secondary | ICD-10-CM

## 2020-05-29 DIAGNOSIS — O99212 Obesity complicating pregnancy, second trimester: Secondary | ICD-10-CM | POA: Diagnosis not present

## 2020-05-30 ENCOUNTER — Other Ambulatory Visit: Payer: Self-pay | Admitting: *Deleted

## 2020-05-30 DIAGNOSIS — O409XX0 Polyhydramnios, unspecified trimester, not applicable or unspecified: Secondary | ICD-10-CM

## 2020-06-07 ENCOUNTER — Other Ambulatory Visit: Payer: Self-pay

## 2020-06-07 ENCOUNTER — Ambulatory Visit (INDEPENDENT_AMBULATORY_CARE_PROVIDER_SITE_OTHER): Payer: Medicaid Other | Admitting: Women's Health

## 2020-06-07 VITALS — BP 118/72 | HR 99 | Wt 245.0 lb

## 2020-06-07 DIAGNOSIS — Z23 Encounter for immunization: Secondary | ICD-10-CM

## 2020-06-07 DIAGNOSIS — O0990 Supervision of high risk pregnancy, unspecified, unspecified trimester: Secondary | ICD-10-CM

## 2020-06-07 DIAGNOSIS — O99283 Endocrine, nutritional and metabolic diseases complicating pregnancy, third trimester: Secondary | ICD-10-CM

## 2020-06-07 DIAGNOSIS — O99511 Diseases of the respiratory system complicating pregnancy, first trimester: Secondary | ICD-10-CM

## 2020-06-07 DIAGNOSIS — E0590 Thyrotoxicosis, unspecified without thyrotoxic crisis or storm: Secondary | ICD-10-CM

## 2020-06-07 DIAGNOSIS — O409XX0 Polyhydramnios, unspecified trimester, not applicable or unspecified: Secondary | ICD-10-CM | POA: Insufficient documentation

## 2020-06-07 DIAGNOSIS — D573 Sickle-cell trait: Secondary | ICD-10-CM

## 2020-06-07 DIAGNOSIS — J45909 Unspecified asthma, uncomplicated: Secondary | ICD-10-CM

## 2020-06-07 DIAGNOSIS — O10919 Unspecified pre-existing hypertension complicating pregnancy, unspecified trimester: Secondary | ICD-10-CM

## 2020-06-07 DIAGNOSIS — O99820 Streptococcus B carrier state complicating pregnancy: Secondary | ICD-10-CM

## 2020-06-07 MED ORDER — FLOVENT HFA 110 MCG/ACT IN AERO: 1.0000 | INHALATION_SPRAY | Freq: Every day | RESPIRATORY_TRACT | 12 refills | Status: DC

## 2020-06-07 NOTE — Progress Notes (Signed)
Subjective:  Kathleen [name] is a 29 y.o. G4P1021 at [redacted]w[redacted]d being seen today for ongoing prenatal care.  She is currently monitored for the following issues for this high-risk pregnancy and has Supervision of high risk pregnancy, antepartum; Chronic hypertension affecting pregnancy; Asthma affecting pregnancy in first trimester; Sickle cell trait (HCC); Hyperthyroidism affecting pregnancy; GBS (group B Streptococcus carrier), +RV culture, currently pregnant; Short interval between pregnancies affecting pregnancy in first trimester, antepartum; and Polyhydramnios affecting pregnancy on their problem list.  Patient reports no complaints.  Contractions: Not present. Vag. Bleeding: None.  Movement: Present. Denies leaking of fluid.   The following portions of the patient's history were reviewed and updated as appropriate: allergies, current medications, past family history, past medical history, past social history, past surgical history and problem list. Problem list updated.  Objective:   Vitals:   06/07/20 1012  BP: 118/72  Pulse: 99  Weight: 245 lb (111.1 kg)    Fetal Status: Fetal Heart Rate (bpm): 153   Movement: Present     General:  Alert, oriented and cooperative. Patient is in no acute distress.  Skin: Skin is warm and dry. No rash noted.   Cardiovascular: Normal heart rate noted  Respiratory: Normal respiratory effort, no problems with respiration noted  Abdomen: Soft, gravid, appropriate for gestational age. Pain/Pressure: Present     Pelvic: Vag. Bleeding: None     Cervical exam deferred        Extremities: Normal range of motion.  Edema: None  Mental Status: Normal mood and affect. Normal behavior. Normal judgment and thought content.   Urinalysis:      Assessment and Plan:  Pregnancy: G4P1021 at [redacted]w[redacted]d  1. Supervision of high risk pregnancy, antepartum -growth [place] q4 weeks, next [place] scheduled 06/30/2019  2. Chronic hypertension affecting pregnancy -BP today 118/72 -on low  dose ASA daily -asymptomatic --Reviewed warning blood pressure values (systolic = / > 140 and/or diastolic =/> 90). Explained that, if blood pressure is elevated, she should sit down, rest, and eat/drink something. If still elevated 15 minutes later, and she is greater than 20 weeks, she should call clinic or come to MAU. She should come to MAU if she has elevated pressures and any of the following:  - headache not relieved with tylenol, rest, hydration -blurry vision, floating spots in her vision - sudden full-body edema or facial edema -RUQ pain that is constant. These symptoms may indicate that her blood pressure is worsening and she may be developing gestational hypertension or pre-eclampsia, which is an emergency.   3. Asthma affecting pregnancy in first trimester -pt reports using albuterol inhaler 4 times daily for about one month -has rescue inhaler at home -Flovent sent to pharmacy in consultation with Dr. Crissie Reese, pt will begin using once per day and if necessary increase to twice per day -lungs clear to auscultation today  4. Sickle cell trait (HCC) -pt reports FOB tested negative for sickle cell with first baby  5. Hyperthyroidism affecting pregnancy in third trimester -per phone note from endocrinology 05/30/2020 "Discussed recent thyroid labs with patient noting low-normal TSH and Total T4. She reports recently having an ob ultrasound noting increased fluid surrounding the baby which they will be closely monitoring. She denies any palpitations. Based on most recent TFT's do not recommend intervention and suspect her previous elevation in T4 and suppression in TSH related to increase TBG during early pregnancy." -next endocrinology visit scheduled 06/27/2020  6. GBS (group B Streptococcus carrier), +RV culture, currently pregnant -treat in  labor  Preterm labor symptoms and general obstetric precautions including but not limited to vaginal bleeding, contractions, leaking of fluid  and fetal movement were reviewed in detail with the patient. I discussed the assessment and treatment plan with the patient. The patient was provided an opportunity to ask questions and all were answered. The patient agreed with the plan and demonstrated an understanding of the instructions. The patient was advised to call back or seek an in-person office evaluation/go to MAU at [facility] for any urgent or concerning symptoms. Please refer to After Visit Summary for other counseling recommendations.  Return in about 2 weeks (around 06/21/2020) for MD ONLY/HOB.   Amiylah Anastos, Odie Sera, NP

## 2020-06-07 NOTE — Patient Instructions (Addendum)
Maternity Assessment Unit (MAU)  The Maternity Assessment Unit (MAU) is located at the [facility]. The address is: [address], [address], [place], [place] [address]. Please see map below for additional directions.    The Maternity Assessment Unit is designed to help you during your pregnancy, and for up to 6 weeks after delivery, with any pregnancy- or postpartum-related emergencies, if you think you are in labor, or if your water has broken. For example, if you experience nausea and vomiting, vaginal bleeding, severe abdominal or pelvic pain, elevated blood pressure or other problems related to your pregnancy or postpartum time, please come to the Maternity Assessment Unit for assistance.           Preeclampsia and Eclampsia Preeclampsia is a serious condition that may develop during pregnancy. This condition causes high blood pressure and increased protein in your urine along with other symptoms, such as headaches and vision changes. These symptoms may develop as the condition gets worse. Preeclampsia may occur at 20 weeks of pregnancy or later. Diagnosing and treating preeclampsia early is very important. If not treated early, it can cause serious problems for you and your baby. One problem it can lead to is eclampsia. Eclampsia is a condition that causes muscle jerking or shaking (convulsions or seizures) and other serious problems for the mother. During pregnancy, delivering your baby may be the best treatment for preeclampsia or eclampsia. For most women, preeclampsia and eclampsia symptoms go away after giving birth. In rare cases, a woman may develop preeclampsia after giving birth (postpartum preeclampsia). This usually occurs within 48 hours after childbirth but may occur up to 6 weeks after giving birth. What are the causes? The cause of preeclampsia is not known. What increases the risk? The following risk factors make you more  likely to develop preeclampsia:  Being pregnant for the first time.  Having had preeclampsia during a past pregnancy.  Having a family history of preeclampsia.  Having high blood pressure.  Being pregnant with more than one baby.  Being 35 or older.  Being African-American.  Having kidney disease or diabetes.  Having medical conditions such as lupus or blood diseases.  Being very overweight (obese). What are the signs or symptoms? The most common symptoms are:  Severe headaches.  Vision problems, such as blurred or double vision.  Abdominal pain, especially upper abdominal pain. Other symptoms that may develop as the condition gets worse include:  Sudden weight gain.  Sudden swelling of the hands, face, legs, and feet.  Severe nausea and vomiting.  Numbness in the face, arms, legs, and feet.  Dizziness.  Urinating less than usual.  Slurred speech.  Convulsions or seizures. How is this diagnosed? There are no screening tests for preeclampsia. Your health care provider will ask you about symptoms and check for signs of preeclampsia during your prenatal visits. You may also have tests that include:  Checking your blood pressure.  Urine tests to check for protein. Your health care provider will check for this at every prenatal visit.  Blood tests.  Monitoring your baby's heart rate.  Ultrasound. How is this treated? You and your health care provider will determine the treatment approach that is best for you. Treatment may include:  Having more frequent prenatal exams to check for signs of preeclampsia, if you have an increased risk for preeclampsia.  Medicine to lower your blood pressure.  Staying in the hospital, if your condition is severe. There, treatment will   focus on controlling your blood pressure and the amount of fluids in your body (fluid retention).  Taking medicine (magnesium sulfate) to prevent seizures. This may be given as an injection or  through an IV.  Taking a low-dose aspirin during your pregnancy.  Delivering your baby early. You may have your labor started with medicine (induced), or you may have a cesarean delivery. Follow these instructions at home: Eating and drinking   Drink enough fluid to keep your urine pale yellow.  Avoid caffeine. Lifestyle  Do not use any products that contain nicotine or tobacco, such as cigarettes and e-cigarettes. If you need help quitting, ask your health care provider.  Do not use alcohol or drugs.  Avoid stress as much as possible. Rest and get plenty of sleep. General instructions  Take over-the-counter and prescription medicines only as told by your health care provider.  When lying down, lie on your left side. This keeps pressure off your major blood vessels.  When sitting or lying down, raise (elevate) your feet. Try putting some pillows underneath your lower legs.  Exercise regularly. Ask your health care provider what kinds of exercise are best for you.  Keep all follow-up and prenatal visits as told by your health care provider. This is important. How is this prevented? There is no known way of preventing preeclampsia or eclampsia from developing. However, to lower your risk of complications and detect problems early:  Get regular prenatal care. Your health care provider may be able to diagnose and treat the condition early.  Maintain a healthy weight. Ask your health care provider for help managing weight gain during pregnancy.  Work with your health care provider to manage any long-term (chronic) health conditions you have, such as diabetes or kidney problems.  You may have tests of your blood pressure and kidney function after giving birth.  Your health care provider may have you take low-dose aspirin during your next pregnancy. Contact a health care provider if:  You have symptoms that your health care provider told you may require more treatment or  monitoring, such as: ? Headaches. ? Nausea or vomiting. ? Abdominal pain. ? Dizziness. ? Light-headedness. Get help right away if:  You have severe: ? Abdominal pain. ? Headaches that do not get better. ? Dizziness. ? Vision problems. ? Confusion. ? Nausea or vomiting.  You have any of the following: ? A seizure. ? Sudden, rapid weight gain. ? Sudden swelling in your hands, ankles, or face. ? Trouble moving any part of your body. ? Numbness in any part of your body. ? Trouble speaking. ? Abnormal bleeding.  You faint. Summary  Preeclampsia is a serious condition that may develop during pregnancy.  This condition causes high blood pressure and increased protein in your urine along with other symptoms, such as headaches and vision changes.  Diagnosing and treating preeclampsia early is very important. If not treated early, it can cause serious problems for you and your baby.  Get help right away if you have symptoms that your health care provider told you to watch for. This information is not intended to replace advice given to you by your health care provider. Make sure you discuss any questions you have with your health care provider. Document Revised: 02/09/2018 Document Reviewed: 01/14/2016 Elsevier Patient Education  2020 [employer] Inc[employer]       Preterm Labor and Birth Information  The normal length of a pregnancy is 39-41 weeks. Preterm labor is when labor starts before 37 completed   weeks of pregnancy. What are the risk factors for preterm labor? Preterm labor is more likely to occur in women who:  Have certain infections during pregnancy such as a bladder infection, sexually transmitted infection, or infection inside the uterus (chorioamnionitis).  Have a shorter-than-normal cervix.  Have gone into preterm labor before.  Have had surgery on their cervix.  Are younger than age 17 or older than age 35.  Are African American.  Are pregnant with twins or  multiple babies (multiple gestation).  Take street drugs or smoke while pregnant.  Do not gain enough weight while pregnant.  Became pregnant shortly after having been pregnant. What are the symptoms of preterm labor? Symptoms of preterm labor include:  Cramps similar to those that can happen during a menstrual period. The cramps may happen with diarrhea.  Pain in the abdomen or lower back.  Regular uterine contractions that may feel like tightening of the abdomen.  A feeling of increased pressure in the pelvis.  Increased watery or bloody mucus discharge from the vagina.  Water breaking (ruptured amniotic sac). Why is it important to recognize signs of preterm labor? It is important to recognize signs of preterm labor because babies who are born prematurely may not be fully developed. This can put them at an increased risk for:  Long-term (chronic) heart and lung problems.  Difficulty immediately after birth with regulating body systems, including blood sugar, body temperature, heart rate, and breathing rate.  Bleeding in the brain.  Cerebral palsy.  Learning difficulties.  Death. These risks are highest for babies who are born before 34 weeks of pregnancy. How is preterm labor treated? Treatment depends on the length of your pregnancy, your condition, and the health of your baby. It may involve:  Having a stitch (suture) placed in your cervix to prevent your cervix from opening too early (cerclage).  Taking or being given medicines, such as: ? Hormone medicines. These may be given early in pregnancy to help support the pregnancy. ? Medicine to stop contractions. ? Medicines to help mature the baby's lungs. These may be prescribed if the risk of delivery is high. ? Medicines to prevent your baby from developing cerebral palsy. If the labor happens before 34 weeks of pregnancy, you may need to stay in the hospital. What should I do if I think I am in preterm labor? If  you think that you are going into preterm labor, call your health care provider right away. How can I prevent preterm labor in future pregnancies? To increase your chance of having a full-term pregnancy:  Do not use any tobacco products, such as cigarettes, chewing tobacco, and e-cigarettes. If you need help quitting, ask your health care provider.  Do not use street drugs or medicines that have not been prescribed to you during your pregnancy.  Talk with your health care provider before taking any herbal supplements, even if you have been taking them regularly.  Make sure you gain a healthy amount of weight during your pregnancy.  Watch for infection. If you think that you might have an infection, get it checked right away.  Make sure to tell your health care provider if you have gone into preterm labor before. This information is not intended to replace advice given to you by your health care provider. Make sure you discuss any questions you have with your health care provider. Document Revised: 10/01/2018 Document Reviewed: 10/31/2015 Elsevier Patient Education  2020 [employer] Inc.  

## 2020-06-08 LAB — CBC
Hematocrit: 39 % (ref 34.0–46.6)
Hemoglobin: 13.7 g/dL (ref 11.1–15.9)
MCH: 30.9 pg (ref 26.6–33.0)
MCHC: 35.1 g/dL (ref 31.5–35.7)
MCV: 88 fL (ref 79–97)
Platelets: 261 10*3/uL (ref 150–450)
RBC: 4.43 x10E6/uL (ref 3.77–5.28)
RDW: 12.7 % (ref 11.7–15.4)
WBC: 8 10*3/uL (ref 3.4–10.8)

## 2020-06-08 LAB — GLUCOSE TOLERANCE, 2 HOURS W/ 1HR
Glucose, 1 hour: 91 mg/dL (ref 65–179)
Glucose, 2 hour: 93 mg/dL (ref 65–152)
Glucose, Fasting: 74 mg/dL (ref 65–91)

## 2020-06-08 LAB — RPR: RPR Ser Ql: NONREACTIVE

## 2020-06-08 LAB — HIV ANTIBODY (ROUTINE TESTING W REFLEX): HIV Screen 4th Generation wRfx: NONREACTIVE

## 2020-06-21 ENCOUNTER — Other Ambulatory Visit: Payer: Self-pay

## 2020-06-21 ENCOUNTER — Ambulatory Visit (INDEPENDENT_AMBULATORY_CARE_PROVIDER_SITE_OTHER): Payer: Medicaid Other | Admitting: Family Medicine

## 2020-06-21 VITALS — BP 123/74 | HR 109 | Wt 254.5 lb

## 2020-06-21 DIAGNOSIS — D573 Sickle-cell trait: Secondary | ICD-10-CM

## 2020-06-21 DIAGNOSIS — O09891 Supervision of other high risk pregnancies, first trimester: Secondary | ICD-10-CM

## 2020-06-21 DIAGNOSIS — O99511 Diseases of the respiratory system complicating pregnancy, first trimester: Secondary | ICD-10-CM

## 2020-06-21 DIAGNOSIS — J45909 Unspecified asthma, uncomplicated: Secondary | ICD-10-CM

## 2020-06-21 DIAGNOSIS — E0590 Thyrotoxicosis, unspecified without thyrotoxic crisis or storm: Secondary | ICD-10-CM

## 2020-06-21 DIAGNOSIS — O0990 Supervision of high risk pregnancy, unspecified, unspecified trimester: Secondary | ICD-10-CM

## 2020-06-21 DIAGNOSIS — O409XX0 Polyhydramnios, unspecified trimester, not applicable or unspecified: Secondary | ICD-10-CM

## 2020-06-21 DIAGNOSIS — O10919 Unspecified pre-existing hypertension complicating pregnancy, unspecified trimester: Secondary | ICD-10-CM

## 2020-06-21 DIAGNOSIS — O99283 Endocrine, nutritional and metabolic diseases complicating pregnancy, third trimester: Secondary | ICD-10-CM

## 2020-06-21 LAB — POCT URINALYSIS DIP (DEVICE)
Bilirubin Urine: NEGATIVE
Glucose, UA: NEGATIVE mg/dL
Hgb urine dipstick: NEGATIVE
Ketones, ur: NEGATIVE mg/dL
Leukocytes,Ua: NEGATIVE
Nitrite: NEGATIVE
Protein, ur: NEGATIVE mg/dL
Specific Gravity, Urine: <=1.005 (ref 1.005–1.030)
Urobilinogen, UA: 0.2 mg/dL (ref 0.0–1.0)
pH: 5.5 (ref 5.0–8.0)

## 2020-06-21 NOTE — Patient Instructions (Signed)
 Contraception Choices Contraception, also called birth control, refers to methods or devices that prevent pregnancy. Hormonal methods Contraceptive implant  A contraceptive implant is a thin, plastic tube that contains a hormone. It is inserted into the upper part of the arm. It can remain in place for up to 3 years. Progestin-only injections Progestin-only injections are injections of progestin, a synthetic form of the hormone progesterone. They are given every 3 months by a health care provider. Birth control pills  Birth control pills are pills that contain hormones that prevent pregnancy. They must be taken once a day, preferably at the same time each day. Birth control patch  The birth control patch contains hormones that prevent pregnancy. It is placed on the skin and must be changed once a week for three weeks and removed on the fourth week. A prescription is needed to use this method of contraception. Vaginal ring  A vaginal ring contains hormones that prevent pregnancy. It is placed in the vagina for three weeks and removed on the fourth week. After that, the process is repeated with a new ring. A prescription is needed to use this method of contraception. Emergency contraceptive Emergency contraceptives prevent pregnancy after unprotected sex. They come in pill form and can be taken up to 5 days after sex. They work best the sooner they are taken after having sex. Most emergency contraceptives are available without a prescription. This method should not be used as your only form of birth control. Barrier methods Female condom  A female condom is a thin sheath that is worn over the penis during sex. Condoms keep sperm from going inside a woman's body. They can be used with a spermicide to increase their effectiveness. They should be disposed after a single use. Female condom  A female condom is a soft, loose-fitting sheath that is put into the vagina before sex. The condom keeps  sperm from going inside a woman's body. They should be disposed after a single use. Diaphragm  A diaphragm is a soft, dome-shaped barrier. It is inserted into the vagina before sex, along with a spermicide. The diaphragm blocks sperm from entering the uterus, and the spermicide kills sperm. A diaphragm should be left in the vagina for 6-8 hours after sex and removed within 24 hours. A diaphragm is prescribed and fitted by a health care provider. A diaphragm should be replaced every 1-2 years, after giving birth, after gaining more than 15 lb (6.8 kg), and after pelvic surgery. Cervical cap  A cervical cap is a round, soft latex or plastic cup that fits over the cervix. It is inserted into the vagina before sex, along with spermicide. It blocks sperm from entering the uterus. The cap should be left in place for 6-8 hours after sex and removed within 48 hours. A cervical cap must be prescribed and fitted by a health care provider. It should be replaced every 2 years. Sponge  A sponge is a soft, circular piece of polyurethane foam with spermicide on it. The sponge helps block sperm from entering the uterus, and the spermicide kills sperm. To use it, you make it wet and then insert it into the vagina. It should be inserted before sex, left in for at least 6 hours after sex, and removed and thrown away within 30 hours. Spermicides Spermicides are chemicals that kill or block sperm from entering the cervix and uterus. They can come as a cream, jelly, suppository, foam, or tablet. A spermicide should be inserted into   the vagina with an applicator at least 10-15 minutes before sex to allow time for it to work. The process must be repeated every time you have sex. Spermicides do not require a prescription. Intrauterine contraception Intrauterine device (IUD) An IUD is a T-shaped device that is put in a woman's uterus. There are two types:  Hormone IUD.This type contains progestin, a synthetic form of the  hormone progesterone. This type can stay in place for 3-5 years.  Copper IUD.This type is wrapped in copper wire. It can stay in place for 10 years.  Permanent methods of contraception Female tubal ligation In this method, a woman's fallopian tubes are sealed, tied, or blocked during surgery to prevent eggs from traveling to the uterus. Hysteroscopic sterilization In this method, a small, flexible insert is placed into each fallopian tube. The inserts cause scar tissue to form in the fallopian tubes and block them, so sperm cannot reach an egg. The procedure takes about 3 months to be effective. Another form of birth control must be used during those 3 months. Female sterilization This is a procedure to tie off the tubes that carry sperm (vasectomy). After the procedure, the man can still ejaculate fluid (semen). Natural planning methods Natural family planning In this method, a couple does not have sex on days when the woman could become pregnant. Calendar method This means keeping track of the length of each menstrual cycle, identifying the days when pregnancy can happen, and not having sex on those days. Ovulation method In this method, a couple avoids sex during ovulation. Symptothermal method This method involves not having sex during ovulation. The woman typically checks for ovulation by watching changes in her temperature and in the consistency of cervical mucus. Post-ovulation method In this method, a couple waits to have sex until after ovulation. Summary  Contraception, also called birth control, means methods or devices that prevent pregnancy.  Hormonal methods of contraception include implants, injections, pills, patches, vaginal rings, and emergency contraceptives.  Barrier methods of contraception can include female condoms, female condoms, diaphragms, cervical caps, sponges, and spermicides.  There are two types of IUDs (intrauterine devices). An IUD can be put in a woman's  uterus to prevent pregnancy for 3-5 years.  Permanent sterilization can be done through a procedure for males, females, or both.  Natural family planning methods involve not having sex on days when the woman could become pregnant. This information is not intended to replace advice given to you by your health care provider. Make sure you discuss any questions you have with your health care provider. Document Revised: 06/11/2017 Document Reviewed: 07/12/2016 Elsevier Patient Education  2020 [employer]   Breastfeeding  Choosing to breastfeed is one of the best decisions you can make for yourself and your baby. A change in hormones during pregnancy causes your breasts to make breast milk in your milk-producing glands. Hormones prevent breast milk from being released before your baby is born. They also prompt milk flow after birth. Once breastfeeding has begun, thoughts of your baby, as well as his or her sucking or crying, can stimulate the release of milk from your milk-producing glands. Benefits of breastfeeding Research shows that breastfeeding offers many health benefits for infants and mothers. It also offers a cost-free and convenient way to feed your baby. For your baby  Your first milk (colostrum) helps your baby's digestive system to function better.  Special cells in your milk (antibodies) help your baby to fight off infections.  Breastfed babies are   less likely to develop asthma, allergies, obesity, or type 2 diabetes. They are also at lower risk for sudden infant death syndrome (SIDS).  Nutrients in breast milk are better able to meet your baby's needs compared to infant formula.  Breast milk improves your baby's brain development. For you  Breastfeeding helps to create a very special bond between you and your baby.  Breastfeeding is convenient. Breast milk costs nothing and is always available at the correct temperature.  Breastfeeding helps to burn calories. It helps you  to lose the weight that you gained during pregnancy.  Breastfeeding makes your uterus return faster to its size before pregnancy. It also slows bleeding (lochia) after you give birth.  Breastfeeding helps to lower your risk of developing type 2 diabetes, osteoporosis, rheumatoid arthritis, cardiovascular disease, and breast, ovarian, uterine, and endometrial cancer later in life. Breastfeeding basics Starting breastfeeding  Find a comfortable place to sit or lie down, with your neck and back well-supported.  Place a pillow or a rolled-up blanket under your baby to bring him or her to the level of your breast (if you are seated). Nursing pillows are specially designed to help support your arms and your baby while you breastfeed.  Make sure that your baby's tummy (abdomen) is facing your abdomen.  Gently massage your breast. With your fingertips, massage from the outer edges of your breast inward toward the nipple. This encourages milk flow. If your milk flows slowly, you may need to continue this action during the feeding.  Support your breast with 4 fingers underneath and your thumb above your nipple (make the letter "C" with your hand). Make sure your fingers are well away from your nipple and your baby's mouth.  Stroke your baby's lips gently with your finger or nipple.  When your baby's mouth is open wide enough, quickly bring your baby to your breast, placing your entire nipple and as much of the areola as possible into your baby's mouth. The areola is the colored area around your nipple. ? More areola should be visible above your baby's upper lip than below the lower lip. ? Your baby's lips should be opened and extended outward (flanged) to ensure an adequate, comfortable latch. ? Your baby's tongue should be between his or her lower gum and your breast.  Make sure that your baby's mouth is correctly positioned around your nipple (latched). Your baby's lips should create a seal on your  breast and be turned out (everted).  It is common for your baby to suck about 2-3 minutes in order to start the flow of breast milk. Latching Teaching your baby how to latch onto your breast properly is very important. An improper latch can cause nipple pain, decreased milk supply, and poor weight gain in your baby. Also, if your baby is not latched onto your nipple properly, he or she may swallow some air during feeding. This can make your baby fussy. Burping your baby when you switch breasts during the feeding can help to get rid of the air. However, teaching your baby to latch on properly is still the best way to prevent fussiness from swallowing air while breastfeeding. Signs that your baby has successfully latched onto your nipple  Silent tugging or silent sucking, without causing you pain. Infant's lips should be extended outward (flanged).  Swallowing heard between every 3-4 sucks once your milk has started to flow (after your let-down milk reflex occurs).  Muscle movement above and in front of his or her   ears while sucking. Signs that your baby has not successfully latched onto your nipple  Sucking sounds or smacking sounds from your baby while breastfeeding.  Nipple pain. If you think your baby has not latched on correctly, slip your finger into the corner of your baby's mouth to break the suction and place it between your baby's gums. Attempt to start breastfeeding again. Signs of successful breastfeeding Signs from your baby  Your baby will gradually decrease the number of sucks or will completely stop sucking.  Your baby will fall asleep.  Your baby's body will relax.  Your baby will retain a small amount of milk in his or her mouth.  Your baby will let go of your breast by himself or herself. Signs from you  Breasts that have increased in firmness, weight, and size 1-3 hours after feeding.  Breasts that are softer immediately after breastfeeding.  Increased milk  volume, as well as a change in milk consistency and color by the fifth day of breastfeeding.  Nipples that are not sore, cracked, or bleeding. Signs that your baby is getting enough milk  Wetting at least 1-2 diapers during the first 24 hours after birth.  Wetting at least 5-6 diapers every 24 hours for the first week after birth. The urine should be clear or pale yellow by the age of 5 days.  Wetting 6-8 diapers every 24 hours as your baby continues to grow and develop.  At least 3 stools in a 24-hour period by the age of 5 days. The stool should be soft and yellow.  At least 3 stools in a 24-hour period by the age of 7 days. The stool should be seedy and yellow.  No loss of weight greater than 10% of birth weight during the first 3 days of life.  Average weight gain of 4-7 oz (113-198 g) per week after the age of 4 days.  Consistent daily weight gain by the age of 5 days, without weight loss after the age of 2 weeks. After a feeding, your baby may spit up a small amount of milk. This is normal. Breastfeeding frequency and duration Frequent feeding will help you make more milk and can prevent sore nipples and extremely full breasts (breast engorgement). Breastfeed when you feel the need to reduce the fullness of your breasts or when your baby shows signs of hunger. This is called "breastfeeding on demand." Signs that your baby is hungry include:  Increased alertness, activity, or restlessness.  Movement of the head from side to side.  Opening of the mouth when the corner of the mouth or cheek is stroked (rooting).  Increased sucking sounds, smacking lips, cooing, sighing, or squeaking.  Hand-to-mouth movements and sucking on fingers or hands.  Fussing or crying. Avoid introducing a pacifier to your baby in the first 4-6 weeks after your baby is born. After this time, you may choose to use a pacifier. Research has shown that pacifier use during the first year of a baby's life  decreases the risk of sudden infant death syndrome (SIDS). Allow your baby to feed on each breast as long as he or she wants. When your baby unlatches or falls asleep while feeding from the first breast, offer the second breast. Because newborns are often sleepy in the first few weeks of life, you may need to awaken your baby to get him or her to feed. Breastfeeding times will vary from baby to baby. However, the following rules can serve as a guide to   help you make sure that your baby is properly fed:  Newborns (babies 4 weeks of age or younger) may breastfeed every 1-3 hours.  Newborns should not go without breastfeeding for longer than 3 hours during the day or 5 hours during the night.  You should breastfeed your baby a minimum of 8 times in a 24-hour period. Breast milk pumping     Pumping and storing breast milk allows you to make sure that your baby is exclusively fed your breast milk, even at times when you are unable to breastfeed. This is especially important if you go back to work while you are still breastfeeding, or if you are not able to be present during feedings. Your lactation consultant can help you find a method of pumping that works best for you and give you guidelines about how long it is safe to store breast milk. Caring for your breasts while you breastfeed Nipples can become dry, cracked, and sore while breastfeeding. The following recommendations can help keep your breasts moisturized and healthy:  Avoid using soap on your nipples.  Wear a supportive bra designed especially for nursing. Avoid wearing underwire-style bras or extremely tight bras (sports bras).  Air-dry your nipples for 3-4 minutes after each feeding.  Use only cotton bra pads to absorb leaked breast milk. Leaking of breast milk between feedings is normal.  Use lanolin on your nipples after breastfeeding. Lanolin helps to maintain your skin's normal moisture barrier. Pure lanolin is not harmful (not  toxic) to your baby. You may also hand express a few drops of breast milk and gently massage that milk into your nipples and allow the milk to air-dry. In the first few weeks after giving birth, some women experience breast engorgement. Engorgement can make your breasts feel heavy, warm, and tender to the touch. Engorgement peaks within 3-5 days after you give birth. The following recommendations can help to ease engorgement:  Completely empty your breasts while breastfeeding or pumping. You may want to start by applying warm, moist heat (in the shower or with warm, water-soaked hand towels) just before feeding or pumping. This increases circulation and helps the milk flow. If your baby does not completely empty your breasts while breastfeeding, pump any extra milk after he or she is finished.  Apply ice packs to your breasts immediately after breastfeeding or pumping, unless this is too uncomfortable for you. To do this: ? Put ice in a plastic bag. ? Place a towel between your skin and the bag. ? Leave the ice on for 20 minutes, 2-3 times a day.  Make sure that your baby is latched on and positioned properly while breastfeeding. If engorgement persists after 48 hours of following these recommendations, contact your health care provider or a lactation consultant. Overall health care recommendations while breastfeeding  Eat 3 healthy meals and 3 snacks every day. Well-nourished mothers who are breastfeeding need an additional 450-500 calories a day. You can meet this requirement by increasing the amount of a balanced diet that you eat.  Drink enough water to keep your urine pale yellow or clear.  Rest often, relax, and continue to take your prenatal vitamins to prevent fatigue, stress, and low vitamin and mineral levels in your body (nutrient deficiencies).  Do not use any products that contain nicotine or tobacco, such as cigarettes and e-cigarettes. Your baby may be harmed by chemicals from  cigarettes that pass into breast milk and exposure to secondhand smoke. If you need help quitting, ask your   health care provider.  Avoid alcohol.  Do not use illegal drugs or marijuana.  Talk with your health care provider before taking any medicines. These include over-the-counter and prescription medicines as well as vitamins and herbal supplements. Some medicines that may be harmful to your baby can pass through breast milk.  It is possible to become pregnant while breastfeeding. If birth control is desired, ask your health care provider about options that will be safe while breastfeeding your baby. Where to find more information: [employer]: www.llli.org Contact a health care provider if:  You feel like you want to stop breastfeeding or have become frustrated with breastfeeding.  Your nipples are cracked or bleeding.  Your breasts are red, tender, or warm.  You have: ? Painful breasts or nipples. ? A swollen area on either breast. ? A fever or chills. ? Nausea or vomiting. ? Drainage other than breast milk from your nipples.  Your breasts do not become full before feedings by the fifth day after you give birth.  You feel sad and depressed.  Your baby is: ? Too sleepy to eat well. ? Having trouble sleeping. ? More than 1 week old and wetting fewer than 6 diapers in a 24-hour period. ? Not gaining weight by 5 days of age.  Your baby has fewer than 3 stools in a 24-hour period.  Your baby's skin or the white parts of his or her eyes become yellow. Get help right away if:  Your baby is overly tired (lethargic) and does not want to wake up and feed.  Your baby develops an unexplained fever. Summary  Breastfeeding offers many health benefits for infant and mothers.  Try to breastfeed your infant when he or she shows early signs of hunger.  Gently tickle or stroke your baby's lips with your finger or nipple to allow the baby to open his or her mouth.  Bring the baby to your breast. Make sure that much of the areola is in your baby's mouth. Offer one side and burp the baby before you offer the other side.  Talk with your health care provider or lactation consultant if you have questions or you face problems as you breastfeed. This information is not intended to replace advice given to you by your health care provider. Make sure you discuss any questions you have with your health care provider. Document Revised: 09/03/2017 Document Reviewed: 07/11/2016 Elsevier Patient Education  2020 [employer] Inc.  

## 2020-06-21 NOTE — Progress Notes (Signed)
   Subjective:  Kathleen [name] is a 29 y.o. G4P1021 at [redacted]w[redacted]d being seen today for ongoing prenatal care.  She is currently monitored for the following issues for this high-risk pregnancy and has Supervision of high risk pregnancy, antepartum; Chronic hypertension affecting pregnancy; Asthma affecting pregnancy in first trimester; Sickle cell trait (HCC); Hyperthyroidism affecting pregnancy; GBS (group B Streptococcus carrier), +RV culture, currently pregnant; Short interval between pregnancies affecting pregnancy in first trimester, antepartum; and Polyhydramnios affecting pregnancy on their problem list.  Patient reports no complaints.  Contractions: Not present. Vag. Bleeding: None.  Movement: Present. Denies leaking of fluid.   The following portions of the patient's history were reviewed and updated as appropriate: allergies, current medications, past family history, past medical history, past social history, past surgical history and problem list. Problem list updated.  Objective:   Vitals:   06/21/20 1028  BP: 123/74  Pulse: (!) 109  Weight: 254 lb 8 oz (115.4 kg)    Fetal Status: Fetal Heart Rate (bpm): 155   Movement: Present     General:  Alert, oriented and cooperative. Patient is in no acute distress.  Skin: Skin is warm and dry. No rash noted.   Cardiovascular: Normal heart rate noted  Respiratory: Normal respiratory effort, no problems with respiration noted  Abdomen: Soft, gravid, appropriate for gestational age. Pain/Pressure: Present     Pelvic: Vag. Bleeding: None     Cervical exam deferred        Extremities: Normal range of motion.  Edema: Trace  Mental Status: Normal mood and affect. Normal behavior. Normal judgment and thought content.   Urinalysis:      Assessment and Plan:  Pregnancy: G4P1021 at [redacted]w[redacted]d  1. Supervision of high risk pregnancy, antepartum FHR and BP normal Planning on using OCP's Reviewed prior discharge from earlier this year (discharged by  myself), chose to have POPs which clearly did not work Strongly urged her to consider a LARC as she expressed a desire to not have any more children for some time Referred to bedsiders, she will consider  2. Chronic hypertension affecting pregnancy BP normal on amlodipine 5 mg Following regularly w MFM, most recent growth US showed mild poly Has f/u scheduled next week Start antenatal testing in 2 weeks  3. Asthma affecting pregnancy in first trimester Improved since starting flovent at last visit  4. Hyperthyroidism affecting pregnancy in third trimester Seeing endo, stable  5. Polyhydramnios affecting pregnancy Following w MFM  6. Short interval between pregnancies affecting pregnancy in first trimester, antepartum   7. Sickle cell trait (HCC)   Preterm labor symptoms and general obstetric precautions including but not limited to vaginal bleeding, contractions, leaking of fluid and fetal movement were reviewed in detail with the patient. Please refer to After Visit Summary for other counseling recommendations.  Return in 2 weeks (on 07/05/2020) for [facility], ob visit.   Venora Maples, MD

## 2020-06-23 DIAGNOSIS — Z419 Encounter for procedure for purposes other than remedying health state, unspecified: Secondary | ICD-10-CM | POA: Diagnosis not present

## 2020-06-23 NOTE — L&D Delivery Note (Signed)
OB/GYN Faculty Practice Delivery Note  Kathleen [name] is a 30 y.o. [id number] s/p vaginal delivery at [redacted]w[redacted]d. She was admitted for spontaneous onset of labor.   ROM: 0h 77m with clear fluid GBS Status: negative Maximum Maternal Temperature: 49F  Labor Progress: Pt had SROM at 1835 in the MAU. On arrival to L&D she was found to have complete cervical dilation and then had an uncomplicated delivery after a brief second stage as noted below.  Delivery Date/Time: 08/18/20 at 1905 Delivery: Called to room and patient was complete and pushing. Head delivered LOA. Nuchal cord present x1; reduced prior to delivery. Shoulder and body delivered in usual fashion. Infant with spontaneous cry, placed on mother's abdomen, dried and stimulated. Cord clamped x 2 after 1-minute delay, and cut by FOB under my direct supervision. Cord blood drawn. Placenta delivered spontaneously with gentle cord traction. Fundus firm with massage and Pitocin. Labia, perineum, vagina, and cervix were inspected, notable for left peri-urethral laceration s/p repair in standard fashion with use of 4-0 vicryl.   Placenta: 3-vessel cord, intact, sent to L&D Complications: none Lacerations:  left peri-urethral laceration s/p repair in standard fashion with use of 4-0 vicryl EBL: 950 ml (pitocin and TXA administered) Analgesia: none given precipitous nature of delivery  Infant: girl  APGARs 8 & 9  weight 7lb 15oz  [name], MD OB/GYN Fellow, Faculty Practice

## 2020-06-27 DIAGNOSIS — O99283 Endocrine, nutritional and metabolic diseases complicating pregnancy, third trimester: Secondary | ICD-10-CM | POA: Diagnosis not present

## 2020-06-27 DIAGNOSIS — Z3A31 31 weeks gestation of pregnancy: Secondary | ICD-10-CM | POA: Diagnosis not present

## 2020-06-27 DIAGNOSIS — I10 Essential (primary) hypertension: Secondary | ICD-10-CM | POA: Diagnosis not present

## 2020-06-27 DIAGNOSIS — E0590 Thyrotoxicosis, unspecified without thyrotoxic crisis or storm: Secondary | ICD-10-CM | POA: Diagnosis not present

## 2020-06-27 DIAGNOSIS — R000 Tachycardia, unspecified: Secondary | ICD-10-CM | POA: Diagnosis not present

## 2020-06-29 ENCOUNTER — Other Ambulatory Visit: Payer: Self-pay

## 2020-06-29 ENCOUNTER — Ambulatory Visit: Payer: Medicaid Other | Admitting: *Deleted

## 2020-06-29 ENCOUNTER — Other Ambulatory Visit: Payer: Self-pay | Admitting: *Deleted

## 2020-06-29 ENCOUNTER — Encounter: Payer: Self-pay | Admitting: *Deleted

## 2020-06-29 ENCOUNTER — Other Ambulatory Visit: Payer: Self-pay | Admitting: Obstetrics and Gynecology

## 2020-06-29 ENCOUNTER — Ambulatory Visit: Payer: Medicaid Other | Attending: Obstetrics and Gynecology

## 2020-06-29 DIAGNOSIS — Z362 Encounter for other antenatal screening follow-up: Secondary | ICD-10-CM

## 2020-06-29 DIAGNOSIS — O403XX0 Polyhydramnios, third trimester, not applicable or unspecified: Secondary | ICD-10-CM

## 2020-06-29 DIAGNOSIS — Z3A31 31 weeks gestation of pregnancy: Secondary | ICD-10-CM

## 2020-06-29 DIAGNOSIS — E0590 Thyrotoxicosis, unspecified without thyrotoxic crisis or storm: Secondary | ICD-10-CM | POA: Diagnosis not present

## 2020-06-29 DIAGNOSIS — O99283 Endocrine, nutritional and metabolic diseases complicating pregnancy, third trimester: Secondary | ICD-10-CM

## 2020-06-29 DIAGNOSIS — O409XX0 Polyhydramnios, unspecified trimester, not applicable or unspecified: Secondary | ICD-10-CM | POA: Insufficient documentation

## 2020-06-29 DIAGNOSIS — Z148 Genetic carrier of other disease: Secondary | ICD-10-CM | POA: Diagnosis not present

## 2020-06-29 DIAGNOSIS — O99213 Obesity complicating pregnancy, third trimester: Secondary | ICD-10-CM | POA: Diagnosis not present

## 2020-06-29 DIAGNOSIS — O10913 Unspecified pre-existing hypertension complicating pregnancy, third trimester: Secondary | ICD-10-CM | POA: Diagnosis not present

## 2020-06-29 DIAGNOSIS — O09893 Supervision of other high risk pregnancies, third trimester: Secondary | ICD-10-CM | POA: Diagnosis not present

## 2020-06-29 DIAGNOSIS — O163 Unspecified maternal hypertension, third trimester: Secondary | ICD-10-CM | POA: Diagnosis not present

## 2020-06-29 DIAGNOSIS — E669 Obesity, unspecified: Secondary | ICD-10-CM

## 2020-07-05 ENCOUNTER — Other Ambulatory Visit: Payer: Self-pay

## 2020-07-05 ENCOUNTER — Ambulatory Visit (INDEPENDENT_AMBULATORY_CARE_PROVIDER_SITE_OTHER): Payer: Medicaid Other | Admitting: *Deleted

## 2020-07-05 ENCOUNTER — Ambulatory Visit: Payer: Self-pay

## 2020-07-05 ENCOUNTER — Ambulatory Visit (INDEPENDENT_AMBULATORY_CARE_PROVIDER_SITE_OTHER): Payer: Medicaid Other | Admitting: Obstetrics and Gynecology

## 2020-07-05 VITALS — BP 127/69 | HR 115 | Wt 255.0 lb

## 2020-07-05 DIAGNOSIS — O409XX0 Polyhydramnios, unspecified trimester, not applicable or unspecified: Secondary | ICD-10-CM

## 2020-07-05 DIAGNOSIS — O10919 Unspecified pre-existing hypertension complicating pregnancy, unspecified trimester: Secondary | ICD-10-CM

## 2020-07-05 DIAGNOSIS — O99280 Endocrine, nutritional and metabolic diseases complicating pregnancy, unspecified trimester: Secondary | ICD-10-CM

## 2020-07-05 DIAGNOSIS — D573 Sickle-cell trait: Secondary | ICD-10-CM

## 2020-07-05 DIAGNOSIS — O99820 Streptococcus B carrier state complicating pregnancy: Secondary | ICD-10-CM

## 2020-07-05 DIAGNOSIS — E0590 Thyrotoxicosis, unspecified without thyrotoxic crisis or storm: Secondary | ICD-10-CM

## 2020-07-05 DIAGNOSIS — O0990 Supervision of high risk pregnancy, unspecified, unspecified trimester: Secondary | ICD-10-CM

## 2020-07-05 LAB — POCT URINALYSIS DIP (DEVICE)
Bilirubin Urine: NEGATIVE
Glucose, UA: NEGATIVE mg/dL
Hgb urine dipstick: NEGATIVE
Ketones, ur: NEGATIVE mg/dL
Leukocytes,Ua: NEGATIVE
Nitrite: NEGATIVE
Protein, ur: NEGATIVE mg/dL
Specific Gravity, Urine: 1.025 (ref 1.005–1.030)
Urobilinogen, UA: 0.2 mg/dL (ref 0.0–1.0)
pH: 6 (ref 5.0–8.0)

## 2020-07-05 NOTE — Progress Notes (Signed)

## 2020-07-05 NOTE — Progress Notes (Signed)
   PRENATAL VISIT NOTE  Subjective:  Kathleen [name] is a 29 y.o. G4P1021 at [redacted]w[redacted]d being seen today for ongoing prenatal care.  She is currently monitored for the following issues for this high-risk pregnancy and has Supervision of high risk pregnancy, antepartum; Chronic hypertension affecting pregnancy; Asthma affecting pregnancy in first trimester; Sickle cell trait (HCC); Hyperthyroidism affecting pregnancy; GBS (group B Streptococcus carrier), +RV culture, currently pregnant; Short interval between pregnancies affecting pregnancy in first trimester, antepartum; and Polyhydramnios affecting pregnancy on their problem list.  Patient doing well with no acute concerns today. She reports no complaints.  Contractions: Not present. Vag. Bleeding: None.  Movement: Present. Denies leaking of fluid.   Pt is interested in covid vaccine.  All questions answered.   NST was category 1, pt had some uterine activity which was regular but mild amplitude.  Pt did not feel them at all.  She denies recent intercourse.  Uterine activity diminished on its own, butr pt was urged to increase water intake.  Currently pt is 8/10 combined testing, but accels were noted and she may achieve 10/10  The following portions of the patient's history were reviewed and updated as appropriate: allergies, current medications, past family history, past medical history, past social history, past surgical history and problem list. Problem list updated.  Objective:   Vitals:   07/05/20 1059  BP: 127/69  Pulse: (!) 115  Weight: 255 lb (115.7 kg)    Fetal Status: Fetal Heart Rate (bpm): NST   Movement: Present     General:  Alert, oriented and cooperative. Patient is in no acute distress.  Skin: Skin is warm and dry. No rash noted.   Cardiovascular: Normal heart rate noted  Respiratory: Normal respiratory effort, no problems with respiration noted  Abdomen: Soft, gravid, appropriate for gestational age.  Pain/Pressure: Present      Pelvic: Cervical exam deferred        Extremities: Normal range of motion.     Mental Status:  Normal mood and affect. Normal behavior. Normal judgment and thought content.   Assessment and Plan:  Pregnancy: G4P1021 at [redacted]w[redacted]d  1. Supervision of high risk pregnancy, antepartum Continue weekly NST/BPP  2. Chronic hypertension affecting pregnancy Pt controlled well on norvasc  3. Polyhydramnios affecting pregnancy AFI decreased to 20  4. Hyperthyroidism affecting pregnancy, antepartum Reviewed labs from 11/21, pt currently on no meds  5. GBS (group B Streptococcus carrier), +RV culture, currently pregnant Treat in labor  6. Sickle cell trait (HCC)   Preterm labor symptoms and general obstetric precautions including but not limited to vaginal bleeding, contractions, leaking of fluid and fetal movement were reviewed in detail with the patient.  Please refer to After Visit Summary for other counseling recommendations.   Return in about 1 week (around 07/12/2020) for NST/BPP only, On 1/27 needs NST/BPP and HOB. HOB visit in 2 weeks  Mariel Aloe, MD

## 2020-07-05 NOTE — Patient Instructions (Signed)
Hypertension During Pregnancy Hypertension is also called high blood pressure. High blood pressure means that the force of the blood moving in your body is high enough to cause problems for you and your baby. Different types of high blood pressure can happen during pregnancy. The types are:  High blood pressure before you got pregnant. This is called chronic hypertension.  This can continue during your pregnancy. Your doctor will want to keep checking your blood pressure. You may need medicine to control your blood pressure while you are pregnant. You will need follow-up visits after you have your baby.  High blood pressure that goes up during pregnancy when it was normal before. This is called gestational hypertension. It will often get better after you have your baby, but your doctor will need to watch your blood pressure to make sure that it is getting better.  You may develop high blood pressure after giving birth. This is called postpartum hypertension. This often occurs within 48 hours after childbirth but may occur up to 6 weeks after giving birth. Very high blood pressure during pregnancy is an emergency that needs treatment right away. How does this affect me? If you have high blood pressure during pregnancy, you have a higher chance of developing high blood pressure:  As you get older.  If you get pregnant again. In some cases, high blood pressure during pregnancy can cause:  Stroke.  Heart attack.  Damage to the kidneys, lungs, or liver.  Preeclampsia.  HELLP syndrome.  Seizures.  Problems with the placenta. How does this affect my baby? Your baby may:  Be born early.  Not weigh as much as he or she should.  Not handle labor well, leading to a C-section. This condition may also result in a baby's death before birth (stillbirth). What are the risks?  Having high blood pressure during a past pregnancy.  Being overweight.  Being age 35 or older.  Being pregnant  for the first time.  Being pregnant with more than one baby.  Becoming pregnant using fertility methods, such as IVF.  Having other problems, such as diabetes or kidney disease. What can I do to lower my risk?  Keep a healthy weight.  Eat a healthy diet.  Follow what your doctor tells you about treating any medical problems that you had before you got pregnant. It is very important to go to all of your doctor visits. Your doctor will check your blood pressure and make sure that your pregnancy is progressing as it should. Treatment should start early if a problem is found.   How is this treated? Treatment for high blood pressure during pregnancy can vary. It depends on the type of high blood pressure you have and how serious it is.  If you were taking medicine for your blood pressure before you got pregnant, talk with your doctor. You may need to change the medicine during pregnancy if it is not safe for your baby.  If your blood pressure goes up during pregnancy, your doctor may order medicine to treat this.  If you are at risk for preeclampsia, your doctor may tell you to take a low-dose aspirin while you are pregnant.  If you have very high blood pressure, you may need to stay in the hospital so you and your baby can be watched closely. You may also need to take medicine to lower your blood pressure.  In some cases, if your condition gets worse, you may need to have your baby early.   Follow these instructions at home: Eating and drinking  Drink enough fluid to keep your pee (urine) pale yellow.  Avoid caffeine.   Lifestyle  Do not smoke or use any products that contain nicotine or tobacco. If you need help quitting, ask your doctor.  Do not use alcohol or drugs.  Avoid stress.  Rest and get plenty of sleep.  Regular exercise can help. Ask your doctor what kinds of exercise are best for you. General instructions  Take over-the-counter and prescription medicines only as  told by your doctor.  Keep all prenatal and follow-up visits. Contact a doctor if:  You have symptoms that your doctor told you to watch for, such as: ? Headaches. ? A feeling like you may vomit (nausea). ? Vomiting. ? Belly (abdominal) pain. ? Feeling dizzy or light-headed. Get help right away if:  You have symptoms of serious problems, such as: ? Very bad belly pain that does not get better with treatment. ? A very bad headache that does not get better. ? Blurry vision. ? Double vision. ? Vomiting that does not get better. ? Sudden, fast weight gain. ? Sudden swelling in your hands, ankles, or face. ? Bleeding from your vagina. ? Blood in your pee. ? Shortness of breath. ? Chest pain. ? Weakness on one side of your body. ? Trouble talking.  Your baby is not moving as much as usual. These symptoms may be an emergency. Get help right away. Call your local emergency services (911 in the [place]  Do not wait to see if the symptoms will go away.  Do not drive yourself to the hospital. Summary  High blood pressure is also called hypertension.  High blood pressure means that the force of the blood moving in your body is high enough to cause problems for you and your baby.  Get help right away if you have symptoms of serious problems due to high blood pressure.  Keep all prenatal and follow-up visits. This information is not intended to replace advice given to you by your health care provider. Make sure you discuss any questions you have with your health care provider. Document Revised: 03/01/2020 Document Reviewed: 03/01/2020 Elsevier Patient Education  2021 Elsevier Inc.  

## 2020-07-05 NOTE — Progress Notes (Signed)
Pt has questions about covid vaccine

## 2020-07-11 ENCOUNTER — Encounter: Payer: Medicaid Other | Admitting: Obstetrics & Gynecology

## 2020-07-11 ENCOUNTER — Other Ambulatory Visit: Payer: Medicaid Other

## 2020-07-11 NOTE — Progress Notes (Signed)
Patient was assessed and managed by nursing staff during this encounter. I have reviewed the chart and agree with the documentation and plan. I have also made any necessary editorial changes.  Warden Fillers, MD 07/11/2020 8:12 AM

## 2020-07-12 ENCOUNTER — Other Ambulatory Visit: Payer: Medicaid Other

## 2020-07-13 ENCOUNTER — Ambulatory Visit: Payer: Medicaid Other

## 2020-07-19 ENCOUNTER — Ambulatory Visit: Payer: Self-pay

## 2020-07-19 ENCOUNTER — Ambulatory Visit (INDEPENDENT_AMBULATORY_CARE_PROVIDER_SITE_OTHER): Payer: Medicaid Other | Admitting: Obstetrics & Gynecology

## 2020-07-19 ENCOUNTER — Other Ambulatory Visit: Payer: Self-pay

## 2020-07-19 ENCOUNTER — Ambulatory Visit (INDEPENDENT_AMBULATORY_CARE_PROVIDER_SITE_OTHER): Payer: Medicaid Other | Admitting: *Deleted

## 2020-07-19 VITALS — BP 121/69 | HR 99 | Wt 258.1 lb

## 2020-07-19 DIAGNOSIS — O99282 Endocrine, nutritional and metabolic diseases complicating pregnancy, second trimester: Secondary | ICD-10-CM

## 2020-07-19 DIAGNOSIS — O10919 Unspecified pre-existing hypertension complicating pregnancy, unspecified trimester: Secondary | ICD-10-CM

## 2020-07-19 DIAGNOSIS — E0590 Thyrotoxicosis, unspecified without thyrotoxic crisis or storm: Secondary | ICD-10-CM

## 2020-07-19 DIAGNOSIS — O99820 Streptococcus B carrier state complicating pregnancy: Secondary | ICD-10-CM

## 2020-07-19 DIAGNOSIS — O409XX0 Polyhydramnios, unspecified trimester, not applicable or unspecified: Secondary | ICD-10-CM

## 2020-07-19 DIAGNOSIS — J45909 Unspecified asthma, uncomplicated: Secondary | ICD-10-CM

## 2020-07-19 DIAGNOSIS — O0990 Supervision of high risk pregnancy, unspecified, unspecified trimester: Secondary | ICD-10-CM

## 2020-07-19 DIAGNOSIS — Z3A34 34 weeks gestation of pregnancy: Secondary | ICD-10-CM

## 2020-07-19 DIAGNOSIS — O99511 Diseases of the respiratory system complicating pregnancy, first trimester: Secondary | ICD-10-CM

## 2020-07-19 DIAGNOSIS — D573 Sickle-cell trait: Secondary | ICD-10-CM

## 2020-07-19 NOTE — Progress Notes (Signed)
Patient interested in discussing contraception options once she delivers. She would prefer to have an IUD and interested in the difference between between copper IUD and hormonal (progesterone-only) IUD. Spoke to her about primary differences including bleeding, adverse events, and duration IUD can be in place. Brochure given to patient and is leaning towards getting the progesterone-only IUD. She can contact me if she has any more questions.

## 2020-07-19 NOTE — Progress Notes (Addendum)
   PRENATAL VISIT NOTE  Subjective:  Kathleen [name] is a 30 y.o. G4P1021 at [redacted]w[redacted]d being seen today for ongoing prenatal care.  She is currently monitored for the following issues for this high-risk pregnancy and has Supervision of high risk pregnancy, antepartum; Chronic hypertension affecting pregnancy; Asthma affecting pregnancy in first trimester; Sickle cell trait (HCC); Hyperthyroidism affecting pregnancy; GBS (group B Streptococcus carrier), +RV culture, currently pregnant; Short interval between pregnancies affecting pregnancy in first trimester, antepartum; and Polyhydramnios affecting pregnancy on their problem list.  Patient reports backache.  Contractions: Irregular. Vag. Bleeding: None.  Movement: Present. Denies leaking of fluid.   The following portions of the patient's history were reviewed and updated as appropriate: allergies, current medications, past family history, past medical history, past social history, past surgical history and problem list.   Objective:   Vitals:   07/19/20 0830  BP: 121/69  Pulse: 99  Weight: 258 lb 1.6 oz (117.1 kg)    Fetal Status: Fetal Heart Rate (bpm): NST   Movement: Present     General:  Alert, oriented and cooperative. Patient is in no acute distress.  Skin: Skin is warm and dry. No rash noted.   Cardiovascular: Normal heart rate noted  Respiratory: Normal respiratory effort, no problems with respiration noted  Abdomen: Soft, gravid, appropriate for gestational age.  Pain/Pressure: Present     Pelvic: Cervical exam deferred        Extremities: Normal range of motion.  Edema: None  Mental Status: Normal mood and affect. Normal behavior. Normal judgment and thought content.   Assessment and Plan:  Pregnancy: G4P1021 at [redacted]w[redacted]d 1. [redacted] weeks gestation of pregnancy - on PNV and baby ASA - having weekly BPPs and monthly growth scans - recheck with MD 2 weeks  2. Supervision of high risk pregnancy, antepartum  3. Chronic hypertension  affecting pregnancy - on Amlodipine  4. Asthma affecting pregnancy in first trimester - stable  5. Hyperthyroidism affecting pregnancy in second trimester  6. GBS (group B Streptococcus carrier), +RV culture, currently pregnant  7. Polyhydramnios affecting pregnancy - resolved with last ultrasound  8. Sickle cell trait (HCC) - FOB tested with prior pregnancy  Preterm labor symptoms and general obstetric precautions including but not limited to vaginal bleeding, contractions, leaking of fluid and fetal movement were reviewed in detail with the patient. Please refer to After Visit Summary for other counseling recommendations.   Return in about 1 week (around 07/26/2020) for BPP in week.  Future Appointments  Date Time Provider Department Center  07/26/2020 10:15 AM [facility] NST [facility]  08/02/2020  8:15 AM [facility] NST [facility]  08/02/2020  9:15 AM Adam Phenix, MD [facility]  08/02/2020  9:45 AM [facility] NURSE [facility]  08/02/2020 10:00 AM [facility] US1 [facility]    Jerene Bears, MD

## 2020-07-19 NOTE — Progress Notes (Signed)
[place] for growth/BPP @ [facility] on 2/10

## 2020-07-20 ENCOUNTER — Ambulatory Visit: Payer: Medicaid Other

## 2020-07-24 DIAGNOSIS — Z419 Encounter for procedure for purposes other than remedying health state, unspecified: Secondary | ICD-10-CM | POA: Diagnosis not present

## 2020-07-26 ENCOUNTER — Ambulatory Visit: Payer: Self-pay

## 2020-07-26 ENCOUNTER — Ambulatory Visit (INDEPENDENT_AMBULATORY_CARE_PROVIDER_SITE_OTHER): Payer: Medicaid Other | Admitting: *Deleted

## 2020-07-26 ENCOUNTER — Other Ambulatory Visit: Payer: Self-pay

## 2020-07-26 VITALS — BP 119/69 | HR 94 | Wt 257.0 lb

## 2020-07-26 DIAGNOSIS — O10919 Unspecified pre-existing hypertension complicating pregnancy, unspecified trimester: Secondary | ICD-10-CM

## 2020-07-26 NOTE — Progress Notes (Signed)

## 2020-07-27 ENCOUNTER — Ambulatory Visit: Payer: Medicaid Other

## 2020-08-02 ENCOUNTER — Ambulatory Visit (INDEPENDENT_AMBULATORY_CARE_PROVIDER_SITE_OTHER): Payer: Medicaid Other | Admitting: *Deleted

## 2020-08-02 ENCOUNTER — Other Ambulatory Visit (HOSPITAL_COMMUNITY)
Admission: RE | Admit: 2020-08-02 | Discharge: 2020-08-02 | Disposition: A | Discharge status: Home / Self Care | Payer: Medicaid Other | Source: Ambulatory Visit | Attending: Obstetrics & Gynecology | Admitting: Obstetrics & Gynecology

## 2020-08-02 ENCOUNTER — Ambulatory Visit (INDEPENDENT_AMBULATORY_CARE_PROVIDER_SITE_OTHER): Payer: Medicaid Other | Admitting: Obstetrics & Gynecology

## 2020-08-02 ENCOUNTER — Ambulatory Visit: Payer: Medicaid Other

## 2020-08-02 ENCOUNTER — Other Ambulatory Visit: Payer: Self-pay

## 2020-08-02 ENCOUNTER — Ambulatory Visit: Payer: Self-pay

## 2020-08-02 VITALS — BP 127/69 | HR 101 | Wt 258.9 lb

## 2020-08-02 DIAGNOSIS — O10919 Unspecified pre-existing hypertension complicating pregnancy, unspecified trimester: Secondary | ICD-10-CM

## 2020-08-02 DIAGNOSIS — O0990 Supervision of high risk pregnancy, unspecified, unspecified trimester: Secondary | ICD-10-CM | POA: Diagnosis not present

## 2020-08-02 NOTE — Progress Notes (Signed)
   PRENATAL VISIT NOTE  Subjective:  Kathleen [name] is a 30 y.o. G4P1021 at [redacted]w[redacted]d being seen today for ongoing prenatal care.  She is currently monitored for the following issues for this high-risk pregnancy and has Supervision of high risk pregnancy, antepartum; Chronic hypertension affecting pregnancy; Asthma affecting pregnancy in first trimester; Sickle cell trait (HCC); Hyperthyroidism affecting pregnancy; GBS (group B Streptococcus carrier), +RV culture, currently pregnant; Short interval between pregnancies affecting pregnancy in first trimester, antepartum; and Polyhydramnios affecting pregnancy on their problem list.  Patient reports no complaints.  Contractions: Irregular. Vag. Bleeding: None.  Movement: Present. Denies leaking of fluid.   The following portions of the patient's history were reviewed and updated as appropriate: allergies, current medications, past family history, past medical history, past social history, past surgical history and problem list.   Objective:   Vitals:   08/02/20 0848  BP: 127/69  Pulse: (!) 101  Weight: 258 lb 14.4 oz (117.4 kg)    Fetal Status: Fetal Heart Rate (bpm): NST   Movement: Present     General:  Alert, oriented and cooperative. Patient is in no acute distress.  Skin: Skin is warm and dry. No rash noted.   Cardiovascular: Normal heart rate noted  Respiratory: Normal respiratory effort, no problems with respiration noted  Abdomen: Soft, gravid, appropriate for gestational age.  Pain/Pressure: Present     Pelvic: Cervical exam deferred        Extremities: Normal range of motion.  Edema: None  Mental Status: Normal mood and affect. Normal behavior. Normal judgment and thought content.   Assessment and Plan:  Pregnancy: G4P1021 at [redacted]w[redacted]d 1. Supervision of high risk pregnancy, antepartum Routine testing - GC/Chlamydia probe amp ([place])not at [facility] - Strep Gp B NAA  2. Chronic hypertension affecting pregnancy BPP well controlled  on Norvasc  Preterm labor symptoms and general obstetric precautions including but not limited to vaginal bleeding, contractions, leaking of fluid and fetal movement were reviewed in detail with the patient. Please refer to After Visit Summary for other counseling recommendations.   Return in about 2 weeks (around 08/16/2020) for [facility] and NST/BPP.  Future Appointments  Date Time Provider Department Center  08/09/2020  2:30 PM [facility] NURSE [facility]  08/09/2020  2:45 PM [facility] US4 [facility]    Scheryl Darter, MD

## 2020-08-02 NOTE — Patient Instructions (Signed)
Fetal Positions  In the final weeks of your pregnancy, your baby usually moves into a head-down (vertex) position to get ready for birth. As a normal delivery proceeds through the stages of labor, the baby tucks in the chin and turns to face your back. In this position, the back of your baby's head starts to show (crown) first through your open cervix. Sometimes your baby may be in a different, abnormal position just before birth. These positions are called malpositions or malpresentations. Giving birth can be more difficult if your baby is in an abnormal position. Abnormal fetal positions There are five main abnormal fetal positions:  Occiput posterior presentation. This is the most common abnormal fetal position. It is sometimes called the sunny-side up position because your baby's face points toward your front instead of your back.  Breech presentation. This is also common. In this position, your baby's bottom or feet are in position to come out first.  Face or brow presentation. In this position, your baby is head down, but the face or the front of the head crowns first.  Compound presentation. In this position, your baby's hand or leg comes out along with the head or bottom.  Transverse presentation. In this position, your baby is lying sideways across your birth canal. Your baby's shoulder may come out first. Your health care provider can diagnose an abnormal fetal position during a physical exam as your due date approaches. An abnormal fetal position may be found by feeling your belly and by doing an internal (pelvic) exam. A sound wave imaging study (fetal ultrasound) can be done to confirm the abnormal position. What causes an abnormal fetal position? In many cases, the cause for an abnormal fetal position is not known. You may be at higher risk of having a baby in an abnormal fetal position if:  You have an abnormally shaped womb (uterus) or pelvis.  You have growths in your uterus,  such as fibroids.  Your placenta is large or in an abnormal position.  You are having twins or multiples.  You have too much or too little amniotic fluid.  Your baby has some type of developmental abnormality.  You go into early (premature) labor. How does this affect me? In some cases, your baby may abruptly move into a vertex position just before or during labor. However, an abnormal fetal position increases your risk for a long labor or the need for steps to be taken to help ensure a safe delivery. Your health care provider may need to:  Turn the baby manually by pushing on your belly (external cephalic version).  Use instruments, such as forceps or a suctioning device, to help get your baby through the birth canal (assisted delivery).  Deliver your baby by cesarean delivery, also called a C-section. The exact effects on your delivery will depend on the position your baby is in right before birth. If your baby has an occiput posterior presentation:  You may have to push longer and may have a longer labor.  You may have more back pain.  You may deliver vaginally, but you may also need an assisted delivery using forceps or vacuum device.  You may need a cesarean delivery. If your baby is breech:  Your health care provider may try a vaginal delivery. However, in most cases your health care provider will recommend a cesarean delivery to safely deliver your baby. If your baby is in a face or brow presentation:  Your labor may be longer.  You   may be able to have a vaginal or assisted vaginal delivery.  There is a higher-than-normal risk that you will need a cesarean delivery. If your baby is in a compound presentation:  Your health care provider may be able to change your baby's position manually.  In most cases, this position requires a cesarean delivery. If your baby is in a transverse presentation:  Your health care provider may be able to turn your baby manually.  In  most cases, this position requires a cesarean delivery.   How does this affect my baby? Most babies are not affected by an abnormal fetal position, but there is a higher risk of some complications, including:  Swelling and bruising.  Birth injuries.  Not getting enough oxygen during birth. Summary  The normal fetal position for birth is head down and facing toward your back (vertex position).  Abnormal fetal positions include occiput posterior, breech, face or brow presentation, compound presentation, and transverse position.  In some cases, your baby may abruptly move into a normal position before birth, or your health care provider may be able to change your baby's position manually.  If your baby is in an abnormal fetal position at the time of birth, you have a greater risk of a longer labor, assisted delivery, or a cesarean delivery. This information is not intended to replace advice given to you by your health care provider. Make sure you discuss any questions you have with your health care provider. Document Revised: 03/26/2020 Document Reviewed: 03/26/2020 Elsevier Patient Education  2021 [employer] Inc.  

## 2020-08-03 ENCOUNTER — Ambulatory Visit: Payer: Medicaid Other

## 2020-08-03 LAB — GC/CHLAMYDIA PROBE AMP (~~LOC~~) NOT AT ARMC
Chlamydia: NEGATIVE
Comment: NEGATIVE
Comment: NORMAL
Neisseria Gonorrhea: NEGATIVE

## 2020-08-04 LAB — STREP GP B NAA: Strep Gp B NAA: NEGATIVE

## 2020-08-09 ENCOUNTER — Ambulatory Visit: Payer: Medicaid Other | Admitting: *Deleted

## 2020-08-09 ENCOUNTER — Ambulatory Visit: Payer: Medicaid Other | Attending: Maternal & Fetal Medicine

## 2020-08-09 ENCOUNTER — Encounter: Payer: Self-pay | Admitting: *Deleted

## 2020-08-09 ENCOUNTER — Other Ambulatory Visit: Payer: Self-pay

## 2020-08-09 DIAGNOSIS — O409XX0 Polyhydramnios, unspecified trimester, not applicable or unspecified: Secondary | ICD-10-CM | POA: Diagnosis not present

## 2020-08-09 DIAGNOSIS — Z3A37 37 weeks gestation of pregnancy: Secondary | ICD-10-CM | POA: Diagnosis not present

## 2020-08-09 DIAGNOSIS — O10013 Pre-existing essential hypertension complicating pregnancy, third trimester: Secondary | ICD-10-CM

## 2020-08-09 DIAGNOSIS — O10913 Unspecified pre-existing hypertension complicating pregnancy, third trimester: Secondary | ICD-10-CM | POA: Insufficient documentation

## 2020-08-13 ENCOUNTER — Other Ambulatory Visit: Payer: Self-pay | Admitting: Family Medicine

## 2020-08-13 DIAGNOSIS — O10919 Unspecified pre-existing hypertension complicating pregnancy, unspecified trimester: Secondary | ICD-10-CM

## 2020-08-13 DIAGNOSIS — J45909 Unspecified asthma, uncomplicated: Secondary | ICD-10-CM

## 2020-08-13 MED ORDER — ALBUTEROL SULFATE HFA 108 (90 BASE) MCG/ACT IN AERS: 1.0000 | INHALATION_SPRAY | Freq: Four times a day (QID) | RESPIRATORY_TRACT | 0 refills | Status: DC | PRN

## 2020-08-13 MED ORDER — AMLODIPINE BESYLATE 5 MG PO TABS: 5.0000 mg | ORAL_TABLET | Freq: Every day | ORAL | 2 refills | Status: DC

## 2020-08-16 ENCOUNTER — Ambulatory Visit (INDEPENDENT_AMBULATORY_CARE_PROVIDER_SITE_OTHER): Payer: Medicaid Other | Admitting: Family Medicine

## 2020-08-16 ENCOUNTER — Other Ambulatory Visit: Payer: Self-pay

## 2020-08-16 ENCOUNTER — Ambulatory Visit: Payer: Self-pay

## 2020-08-16 ENCOUNTER — Ambulatory Visit (INDEPENDENT_AMBULATORY_CARE_PROVIDER_SITE_OTHER): Payer: Medicaid Other | Admitting: *Deleted

## 2020-08-16 VITALS — BP 123/71 | HR 105 | Wt 264.4 lb

## 2020-08-16 DIAGNOSIS — O10919 Unspecified pre-existing hypertension complicating pregnancy, unspecified trimester: Secondary | ICD-10-CM | POA: Diagnosis not present

## 2020-08-16 DIAGNOSIS — E0590 Thyrotoxicosis, unspecified without thyrotoxic crisis or storm: Secondary | ICD-10-CM

## 2020-08-16 DIAGNOSIS — O99283 Endocrine, nutritional and metabolic diseases complicating pregnancy, third trimester: Secondary | ICD-10-CM

## 2020-08-16 DIAGNOSIS — O0990 Supervision of high risk pregnancy, unspecified, unspecified trimester: Secondary | ICD-10-CM

## 2020-08-16 DIAGNOSIS — O99820 Streptococcus B carrier state complicating pregnancy: Secondary | ICD-10-CM

## 2020-08-16 DIAGNOSIS — O09891 Supervision of other high risk pregnancies, first trimester: Secondary | ICD-10-CM

## 2020-08-16 DIAGNOSIS — O409XX0 Polyhydramnios, unspecified trimester, not applicable or unspecified: Secondary | ICD-10-CM

## 2020-08-16 NOTE — Progress Notes (Signed)
   PRENATAL VISIT NOTE  Subjective:  Kathleen [name] is a 30 y.o. G4P1021 at [redacted]w[redacted]d being seen today for ongoing prenatal care.  She is currently monitored for the following issues for this high-risk pregnancy and has Supervision of high risk pregnancy, antepartum; Chronic hypertension affecting pregnancy; Asthma affecting pregnancy in first trimester; Sickle cell trait (HCC); Hyperthyroidism affecting pregnancy; GBS (group B Streptococcus carrier), +RV culture, currently pregnant; Short interval between pregnancies affecting pregnancy in first trimester, antepartum; and Polyhydramnios affecting pregnancy on their problem list.  Patient reports no complaints.  Contractions: Irregular. Vag. Bleeding: None.  Movement: Present. Denies leaking of fluid.   The following portions of the patient's history were reviewed and updated as appropriate: allergies, current medications, past family history, past medical history, past social history, past surgical history and problem list.   Objective:   Vitals:   08/16/20 0842  BP: 123/71  Pulse: (!) 105  Weight: 264 lb 6.4 oz (119.9 kg)    Fetal Status: Fetal Heart Rate (bpm): NST   Movement: Present     General:  Alert, oriented and cooperative. Patient is in no acute distress.  Skin: Skin is warm and dry. No rash noted.   Cardiovascular: Normal heart rate noted  Respiratory: Normal respiratory effort, no problems with respiration noted  Abdomen: Soft, gravid, appropriate for gestational age.  Pain/Pressure: Present     Pelvic: Cervical exam deferred        Extremities: Normal range of motion.  Edema: None  Mental Status: Normal mood and affect. Normal behavior. Normal judgment and thought content.   Assessment and Plan:  Pregnancy: G4P1021 at [redacted]w[redacted]d 1. Supervision of high risk pregnancy, antepartum Discussed IOL  Reviewed contraceptive plan- between [facility] and IUD  2. Chronic hypertension affecting pregnancy Needs IOL scheduled- Orders for IOL  placed today Diana Day RN to schedule IOL at 39 weeks  3. Hyperthyroidism affecting pregnancy in third trimester Last TSH wnl  4. Polyhydramnios affecting pregnancy Resolved  5. GBS (group B Streptococcus carrier), +RV culture, currently pregnant PCN in labor  6. Short interval between pregnancies affecting pregnancy in first trimester, antepartum DIscussed this in light of her contraceptive plan  Preterm labor symptoms and general obstetric precautions including but not limited to vaginal bleeding, contractions, leaking of fluid and fetal movement were reviewed in detail with the patient. Please refer to After Visit Summary for other counseling recommendations.   Return in about 2 weeks (around 08/30/2020) for postpartum BP check.  Future Appointments  Date Time Provider Department Center  08/30/2020  9:00 AM [facility] NURSE [facility]  09/28/2020  8:35 AM Kathlene Cote [facility]    Federico Flake, MD

## 2020-08-16 NOTE — Progress Notes (Signed)
[place] for growth and BPP done 2/17

## 2020-08-17 ENCOUNTER — Encounter (HOSPITAL_COMMUNITY): Payer: Self-pay | Admitting: *Deleted

## 2020-08-17 ENCOUNTER — Telehealth (HOSPITAL_COMMUNITY): Payer: Self-pay | Admitting: *Deleted

## 2020-08-17 NOTE — Telephone Encounter (Signed)
Preadmission screen  

## 2020-08-18 ENCOUNTER — Inpatient Hospital Stay (HOSPITAL_COMMUNITY)
Admission: AD | Admit: 2020-08-18 | Discharge: 2020-08-20 | DRG: 807 | Disposition: A | Discharge status: Home / Self Care | Payer: Medicaid Other | Attending: Family Medicine | Admitting: Family Medicine

## 2020-08-18 ENCOUNTER — Other Ambulatory Visit: Payer: Self-pay

## 2020-08-18 ENCOUNTER — Encounter (HOSPITAL_COMMUNITY): Payer: Self-pay | Admitting: Family Medicine

## 2020-08-18 DIAGNOSIS — O403XX0 Polyhydramnios, third trimester, not applicable or unspecified: Secondary | ICD-10-CM | POA: Diagnosis not present

## 2020-08-18 DIAGNOSIS — O10919 Unspecified pre-existing hypertension complicating pregnancy, unspecified trimester: Secondary | ICD-10-CM

## 2020-08-18 DIAGNOSIS — Z3A38 38 weeks gestation of pregnancy: Secondary | ICD-10-CM | POA: Diagnosis not present

## 2020-08-18 DIAGNOSIS — Z20822 Contact with and (suspected) exposure to covid-19: Secondary | ICD-10-CM | POA: Diagnosis not present

## 2020-08-18 DIAGNOSIS — O99511 Diseases of the respiratory system complicating pregnancy, first trimester: Secondary | ICD-10-CM | POA: Diagnosis present

## 2020-08-18 DIAGNOSIS — O9952 Diseases of the respiratory system complicating childbirth: Secondary | ICD-10-CM | POA: Diagnosis present

## 2020-08-18 DIAGNOSIS — D573 Sickle-cell trait: Secondary | ICD-10-CM | POA: Diagnosis present

## 2020-08-18 DIAGNOSIS — O9902 Anemia complicating childbirth: Secondary | ICD-10-CM | POA: Diagnosis not present

## 2020-08-18 DIAGNOSIS — Z87891 Personal history of nicotine dependence: Secondary | ICD-10-CM

## 2020-08-18 DIAGNOSIS — E0590 Thyrotoxicosis, unspecified without thyrotoxic crisis or storm: Secondary | ICD-10-CM | POA: Diagnosis present

## 2020-08-18 DIAGNOSIS — J45909 Unspecified asthma, uncomplicated: Secondary | ICD-10-CM | POA: Diagnosis present

## 2020-08-18 DIAGNOSIS — O1002 Pre-existing essential hypertension complicating childbirth: Secondary | ICD-10-CM | POA: Diagnosis not present

## 2020-08-18 DIAGNOSIS — O4212 Full-term premature rupture of membranes, onset of labor more than 24 hours following rupture: Secondary | ICD-10-CM | POA: Diagnosis not present

## 2020-08-18 DIAGNOSIS — O26893 Other specified pregnancy related conditions, third trimester: Secondary | ICD-10-CM | POA: Diagnosis not present

## 2020-08-18 LAB — CBC
HCT: 43.2 % (ref 36.0–46.0)
Hemoglobin: 14.7 g/dL (ref 12.0–15.0)
MCH: 30.1 pg (ref 26.0–34.0)
MCHC: 34 g/dL (ref 30.0–36.0)
MCV: 88.5 fL (ref 80.0–100.0)
Platelets: 215 10*3/uL (ref 150–400)
RBC: 4.88 MIL/uL (ref 3.87–5.11)
RDW: 13.2 % (ref 11.5–15.5)
WBC: 11.3 10*3/uL — ABNORMAL HIGH (ref 4.0–10.5)
nRBC: 0 % (ref 0.0–0.2)

## 2020-08-18 LAB — COMPREHENSIVE METABOLIC PANEL
ALT: 10 U/L (ref 0–44)
AST: 48 U/L — ABNORMAL HIGH (ref 15–41)
Albumin: 3.4 g/dL — ABNORMAL LOW (ref 3.5–5.0)
Alkaline Phosphatase: 80 U/L (ref 38–126)
Anion gap: 14 (ref 5–15)
BUN: 6 mg/dL (ref 6–20)
CO2: 20 mmol/L — ABNORMAL LOW (ref 22–32)
Calcium: 9.4 mg/dL (ref 8.9–10.3)
Chloride: 99 mmol/L (ref 98–111)
Creatinine, Ser: 0.75 mg/dL (ref 0.44–1.00)
GFR, Estimated: >60 mL/min (ref >60)
Glucose, Bld: 94 mg/dL (ref 70–99)
Potassium: 5.1 mmol/L (ref 3.5–5.1)
Sodium: 133 mmol/L — ABNORMAL LOW (ref 135–145)
Total Bilirubin: 1.6 mg/dL — ABNORMAL HIGH (ref 0.3–1.2)
Total Protein: 7 g/dL (ref 6.5–8.1)

## 2020-08-18 LAB — TYPE AND SCREEN
ABO/RH(D): O POS
Antibody Screen: NEGATIVE

## 2020-08-18 LAB — RESP PANEL BY RT-PCR (FLU A&B, COVID) ARPGX2
Influenza A by PCR: NEGATIVE
Influenza B by PCR: NEGATIVE
SARS Coronavirus 2 by RT PCR: NEGATIVE

## 2020-08-18 MED ORDER — SENNOSIDES-DOCUSATE SODIUM 8.6-50 MG PO TABS
2.0000 | ORAL_TABLET | Freq: Every day | ORAL | Status: DC
  Administered 2020-08-19: 2 via ORAL
  Filled 2020-08-18: qty 2

## 2020-08-18 MED ORDER — TRANEXAMIC ACID-NACL 1000-0.7 MG/100ML-% IV SOLN: 1000.0000 mg | INTRAVENOUS | Status: DC

## 2020-08-18 MED ORDER — OXYTOCIN-SODIUM CHLORIDE 30-0.9 UT/500ML-% IV SOLN: 2.5000 [IU]/h | INTRAVENOUS | Status: DC

## 2020-08-18 MED ORDER — ONDANSETRON HCL 4 MG/2ML IJ SOLN: 4.0000 mg | INTRAMUSCULAR | Status: DC | PRN

## 2020-08-18 MED ORDER — LACTATED RINGERS IV SOLN: 500.0000 mL | INTRAVENOUS | Status: DC | PRN

## 2020-08-18 MED ORDER — LIDOCAINE HCL (PF) 1 % IJ SOLN
30.0000 mL | INTRAMUSCULAR | Status: AC | PRN
  Administered 2020-08-18: 30 mL via SUBCUTANEOUS

## 2020-08-18 MED ORDER — WITCH HAZEL-GLYCERIN EX PADS: 1.0000 "application " | MEDICATED_PAD | CUTANEOUS | Status: DC | PRN

## 2020-08-18 MED ORDER — COCONUT OIL OIL: 1.0000 "application " | TOPICAL_OIL | Status: DC | PRN

## 2020-08-18 MED ORDER — ACETAMINOPHEN 325 MG PO TABS
650.0000 mg | ORAL_TABLET | Freq: Four times a day (QID) | ORAL | Status: DC
  Filled 2020-08-18 (×2): qty 2

## 2020-08-18 MED ORDER — DIPHENHYDRAMINE HCL 25 MG PO CAPS: 25.0000 mg | ORAL_CAPSULE | Freq: Four times a day (QID) | ORAL | Status: DC | PRN

## 2020-08-18 MED ORDER — TERBUTALINE SULFATE 1 MG/ML IJ SOLN: 0.2500 mg | Freq: Once | INTRAMUSCULAR | Status: DC | PRN

## 2020-08-18 MED ORDER — OXYTOCIN 10 UNIT/ML IJ SOLN
10.0000 [IU] | Freq: Once | INTRAMUSCULAR | Status: AC
  Administered 2020-08-18: 10 [IU] via INTRAMUSCULAR

## 2020-08-18 MED ORDER — BENZOCAINE-MENTHOL 20-0.5 % EX AERO
1.0000 "application " | INHALATION_SPRAY | CUTANEOUS | Status: DC | PRN
  Administered 2020-08-19: 1 via TOPICAL
  Filled 2020-08-18: qty 56

## 2020-08-18 MED ORDER — OXYCODONE-ACETAMINOPHEN 5-325 MG PO TABS: 1.0000 | ORAL_TABLET | ORAL | Status: DC | PRN

## 2020-08-18 MED ORDER — FENTANYL CITRATE (PF) 100 MCG/2ML IJ SOLN
100.0000 ug | Freq: Once | INTRAMUSCULAR | Status: AC
Start: 2020-08-18 — End: 2020-08-18
  Administered 2020-08-18: 100 ug via INTRAVENOUS
  Filled 2020-08-18: qty 2

## 2020-08-18 MED ORDER — TETANUS-DIPHTH-ACELL PERTUSSIS 5-2.5-18.5 LF-MCG/0.5 IM SUSY: 0.5000 mL | PREFILLED_SYRINGE | Freq: Once | INTRAMUSCULAR | Status: DC

## 2020-08-18 MED ORDER — DIBUCAINE (PERIANAL) 1 % EX OINT: 1.0000 "application " | TOPICAL_OINTMENT | CUTANEOUS | Status: DC | PRN

## 2020-08-18 MED ORDER — ONDANSETRON HCL 4 MG PO TABS: 4.0000 mg | ORAL_TABLET | ORAL | Status: DC | PRN

## 2020-08-18 MED ORDER — ONDANSETRON HCL 4 MG/2ML IJ SOLN: 4.0000 mg | Freq: Four times a day (QID) | INTRAMUSCULAR | Status: DC | PRN

## 2020-08-18 MED ORDER — SOD CITRATE-CITRIC ACID 500-334 MG/5ML PO SOLN: 30.0000 mL | ORAL | Status: DC | PRN

## 2020-08-18 MED ORDER — OXYTOCIN-SODIUM CHLORIDE 30-0.9 UT/500ML-% IV SOLN
INTRAVENOUS | Status: AC
  Filled 2020-08-18: qty 500

## 2020-08-18 MED ORDER — IBUPROFEN 600 MG PO TABS
600.0000 mg | ORAL_TABLET | Freq: Four times a day (QID) | ORAL | Status: DC
  Filled 2020-08-18 (×2): qty 1

## 2020-08-18 MED ORDER — OXYCODONE-ACETAMINOPHEN 5-325 MG PO TABS: 2.0000 | ORAL_TABLET | ORAL | Status: DC | PRN

## 2020-08-18 MED ORDER — OXYTOCIN 10 UNIT/ML IJ SOLN
INTRAMUSCULAR | Status: AC
  Filled 2020-08-18: qty 1

## 2020-08-18 MED ORDER — LACTATED RINGERS IV SOLN: INTRAVENOUS | Status: DC

## 2020-08-18 MED ORDER — PRENATAL MULTIVITAMIN CH: 1.0000 | ORAL_TABLET | Freq: Every day | ORAL | Status: DC

## 2020-08-18 MED ORDER — LIDOCAINE HCL (PF) 1 % IJ SOLN
INTRAMUSCULAR | Status: AC
  Filled 2020-08-18: qty 30

## 2020-08-18 MED ORDER — TRANEXAMIC ACID-NACL 1000-0.7 MG/100ML-% IV SOLN
INTRAVENOUS | Status: AC
  Administered 2020-08-18: 1000 mg
  Filled 2020-08-18: qty 100

## 2020-08-18 MED ORDER — ACETAMINOPHEN 325 MG PO TABS: 650.0000 mg | ORAL_TABLET | ORAL | Status: DC | PRN

## 2020-08-18 MED ORDER — OXYTOCIN BOLUS FROM INFUSION
333.0000 mL | Freq: Once | INTRAVENOUS | Status: AC
  Administered 2020-08-18: 333 mL via INTRAVENOUS

## 2020-08-18 MED ORDER — SIMETHICONE 80 MG PO CHEW: 80.0000 mg | CHEWABLE_TABLET | ORAL | Status: DC | PRN

## 2020-08-18 NOTE — H&P (Signed)
OBSTETRIC ADMISSION HISTORY AND PHYSICAL  Kathleen [name] is a 30 y.o. female [phone] with IUP at [redacted]w[redacted]d by L/8 presenting for spontaneous onset of labor. She reports +FMs, No LOF, no VB, no blurry vision, headaches or peripheral edema, and RUQ pain.  She plans on breast and bottle feeding. She requests POPs for birth control.  She received her prenatal care at [facility].  Dating: By L/8 --->  Estimated Date of Delivery: 08/29/20  Sono: @[redacted]w[redacted]d , CWD, normal anatomy, cephalic presentation, 3524g, EFW  Prenatal History/Complications:  - cHTN (on norvasc 5mg  daily in pregnancy) - Asthma - Sickle cell trait - Hyperthyroidism (no medications in current pregnancy) - Short interval pregnancy - Polyhydramnios  Past Medical History: Past Medical History:  Diagnosis Date  . Asthma   . Hypertension   . Hyperthyroidism     Past Surgical History: Past Surgical History:  Procedure Laterality Date  . DILATION AND EVACUATION N/A 12/16/2012   Procedure: DILATATION AND EVACUATION;  Surgeon: , MD;  Location: [facility];  Service: Gynecology;  Laterality: N/A;  . WISDOM TOOTH EXTRACTION  2009    Obstetrical History: OB History    Gravida  4   Para  1   Term  1   Preterm      AB  2   Living  1     SAB  2   IAB      Ectopic      Multiple  0   Live Births  1           Social History Social History   Socioeconomic History  . Marital status: Single    Spouse name: Not on file  . Number of children: Not on file  . Years of education: Not on file  . Highest education level: Not on file  Occupational History  . Not on file  Tobacco Use  . Smoking status: Former [employer]  . Smokeless tobacco: Never Used  Vaping Use  . Vaping Use: Never used  Substance and Sexual Activity  . Alcohol use: No  . Drug use: Not Currently    Types: Marijuana    Comment: last smoked over a yr ago  . Sexual activity: Yes    Birth control/protection: None  Other Topics Concern   . Not on file  Social History Narrative  . Not on file   Social Determinants of Health   Financial Resource Strain: Not on file  Food Insecurity: No Food Insecurity  . Worried About 2010 in the Last Year: Never true  . Ran Out of Food in the Last Year: Never true  Transportation Needs: No Transportation Needs  . Lack of Transportation (Medical): No  . Lack of Transportation (Non-Medical): No  Physical Activity: Not on file  Stress: Not on file  Social Connections: Not on file    Family History: Family History  Problem Relation Age of Onset  . Cancer Mother   . Hypertension Mother     Allergies: No Known Allergies  Medications Prior to Admission  Medication Sig Dispense Refill Last Dose  . acetaminophen (TYLENOL) 325 MG tablet Take 2 tablets (650 mg total) by mouth every 4 (four) hours as needed (for pain scale < 4). (Patient not taking: No sig reported) 30 tablet 0   . albuterol (VENTOLIN HFA) 108 (90 Base) MCG/ACT inhaler Inhale 1-2 puffs into the lungs every 6 (six) hours as needed for wheezing. For wheezing and shortness of breath. 1 g 0   .  amLODipine (NORVASC) 5 MG tablet Take 1 tablet (5 mg total) by mouth daily. 30 tablet 2   . Aspirin 81 MG CAPS Take by mouth.     . fluticasone (FLOVENT HFA) 110 MCG/ACT inhaler Inhale 1 puff into the lungs daily. (Patient not taking: No sig reported) 1 each 12   . prenatal vitamin w/FE, FA (PRENATAL 1 + 1) 27-1 MG TABS tablet Take 1 tablet by mouth daily at 12 noon. 30 tablet 11      Review of Systems   All systems reviewed and negative except as stated in HPI  Blood pressure 123/70, pulse 85, resp. rate 16, weight 120 kg, last menstrual period 11/21/2019, SpO2 100 %, unknown if currently breastfeeding. General appearance: alert, cooperative and appears stated age Lungs: normal WOB Heart: regular rate  Abdomen: soft, non-tender   Extremities: no sign of DVT Presentation: cephalic Fetal monitoringBaseline: 150  bpm, Variability: Good {> 6 bpm), Accelerations: Reactive and Decelerations: Absent Uterine activity: contractions q2-3 min Dilation: 10 Effacement (%): 100 Station: Plus 1 Exam by:: Carloyn Jaeger, CNM   Prenatal labs: ABO, Rh: --/--/O POS (02/26 1920) Antibody: NEG (02/26 1920) Rubella: 1.00 (07/29 1025) RPR: Non Reactive (12/16 1103)  HBsAg: Negative (07/29 1025)  HIV: Non Reactive (12/16 1103)  GBS: Negative/-- (02/10 0908)  2 hr Glucola wnl Genetic screening 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), 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 Anatomy US wnl  Prenatal Transfer Tool  Maternal Diabetes: No Genetic Screening: Abnormal:  Results: Other: 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), 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 Maternal Ultrasounds/Referrals: Normal Fetal Ultrasounds or other Referrals:  None Maternal Substance Abuse:  No Significant Maternal Medications: amlodipine 5mg  daily Significant Maternal Lab Results: Group B Strep negative  Results for orders placed or performed during the hospital encounter of 08/18/20 (from the past 24 hour(s))  Resp Panel by RT-PCR (Flu A&B, Covid) Nasopharyngeal Swab   Collection Time: 08/18/20  6:51 PM   Specimen: Nasopharyngeal Swab; Nasopharyngeal(NP) swabs in vial transport medium  Result Value Ref Range   SARS Coronavirus 2 by RT PCR NEGATIVE NEGATIVE   Influenza A by PCR NEGATIVE NEGATIVE   Influenza B by PCR NEGATIVE NEGATIVE  Type and screen   Collection Time: 08/18/20  7:20 PM  Result Value Ref Range   ABO/RH(D) O POS    Antibody Screen NEG    Sample Expiration      08/21/2020,2359 Performed at [facility], [address] [place], [place]   CBC   Collection Time: 08/18/20  7:24 PM  Result Value Ref Range   WBC 11.3 (H) 4.0 - 10.5 K/uL   RBC 4.88 3.87 - 5.11 MIL/uL   Hemoglobin 14.7 12.0 - 15.0 g/dL   HCT 08/20/20 14.9 - 70.2 %   MCV 88.5 80.0 - 100.0 fL   MCH 30.1 26.0 - 34.0 pg   MCHC 34.0 30.0 - 36.0 g/dL   RDW 63.7 85.8 - 85.0 %    Platelets 215 150 - 400 K/uL   nRBC 0.0 0.0 - 0.2 %  Comprehensive metabolic panel   Collection Time: 08/18/20  7:24 PM  Result Value Ref Range   Sodium 133 (L) 135 - 145 mmol/L   Potassium 5.1 3.5 - 5.1 mmol/L   Chloride 99 98 - 111 mmol/L   CO2 20 (L) 22 - 32 mmol/L   Glucose, Bld 94 70 - 99 mg/dL   BUN 6 6 - 20 mg/dL   Creatinine, Ser 08/20/20 0.44 - 1.00  mg/dL   Calcium 9.4 8.9 - 19.7 mg/dL   Total Protein 7.0 6.5 - 8.1 g/dL   Albumin 3.4 (L) 3.5 - 5.0 g/dL   AST 48 (H) 15 - 41 U/L   ALT 10 0 - 44 U/L   Alkaline Phosphatase 80 38 - 126 U/L   Total Bilirubin 1.6 (H) 0.3 - 1.2 mg/dL   GFR, Estimated >58 >83 mL/min   Anion gap 14 5 - 15    Patient Active Problem List   Diagnosis Date Noted  . Polyhydramnios affecting pregnancy 06/07/2020  . Short interval between pregnancies affecting pregnancy in first trimester, antepartum 01/19/2020  . GBS (group B Streptococcus carrier), +RV culture, currently pregnant 08/26/2019  . Hyperthyroidism affecting pregnancy 04/04/2019  . Sickle cell trait (HCC) 03/21/2019  . Supervision of high risk pregnancy, antepartum 03/07/2019  . Chronic hypertension affecting pregnancy 03/07/2019  . Asthma affecting pregnancy in first trimester 03/07/2019    Assessment/Plan:  Kathleen [name] is a 30 y.o. G4P1021 at [redacted]w[redacted]d here for spontaneous onset of labor.  #Labor: Expectant management. #Pain: None given precipitous nature of delivery #FWB: Category 1 strip #ID: GBS negative #MOF: breast & bottle #MOC:POPs #Circ: n/a #cHTN: norvasc 5mg  daily in pregnancy. No medications outside of pregnancy. F/u Preeclampsia labs on admission. #Hyperthyroidism: no meds in pregnancy. Plan for repeat TFTs at 6 weeks postpartum.  , MD OB Fellow, Faculty Practice 08/18/2020 9:13 PM

## 2020-08-18 NOTE — MAU Provider Note (Signed)
Ms. Kathleen [name] is a [id number] at [redacted]w[redacted]d seen in MAU for labor. RN labor check, RN requested for provider to recheck cervix.   SVE by RN Dilation: 10 Dilation Complete Date: 08/18/20 Dilation Complete Time: 1846 Effacement (%): 100 Station: Plus 1 Presentation: Vertex Exam by:: Carloyn Jaeger, CNM   NST - FHR: 160 bpm / moderate variability / accels present / decels absent / TOCO: regular every 2 mins   Plan:  Admit to L&D [name], MD notified of admission and meet [place] in room 217 for delivery  [name], [facility]  08/18/2020 6:53 PM

## 2020-08-18 NOTE — Progress Notes (Signed)
Kathleen [name], CNM asked to evaluate pt cervix per this RN request.  Cervical exam initially done by this RN, desired verification from CNM.

## 2020-08-18 NOTE — MAU Note (Addendum)
Presented with c/o ctxs that began this morning @ 0530.  Reports water broke @ 1835 while @ patient registration.  Pt noted to be grossly ruptured with clear fluid.  Endorses CHTN with this pregnancy, denies any other complications or risk factors. Cervical check performed, C/C.  Covid swab obtained.  Report given to C. Barbarann Ehlers, RN & room assignment given. Pt to L&D via stretcher with Carloyn Jaeger, CNM in attendance.

## 2020-08-18 NOTE — Discharge Summary (Signed)
Postpartum Discharge Summary    Patient Name: Kathleen [name] DOB: 10-30-1990 MRN: [id number]  Date of admission: 08/18/2020 Delivery date:08/18/2020  Delivering provider: Randa Ngo  Date of discharge: 08/20/2020  Admitting diagnosis: Chronic hypertension affecting pregnancy [O10.919] Intrauterine pregnancy: [redacted]w[redacted]d    Secondary diagnosis:  Principal Problem:   Vaginal delivery Active Problems:   Chronic hypertension affecting pregnancy   Asthma affecting pregnancy in first trimester   [place] cell [place] ([place]   Hyperthyroidism affecting pregnancy  Additional problems: none    Discharge diagnosis: Term Pregnancy Delivered and CHTN                                              Post partum procedures:none Augmentation: N/A Complications: None  Hospital course: Onset of Labor With Vaginal Delivery      30y.o. yo [id number] admitted in Active Labor on 08/18/2020. Patient had an uncomplicated labor course as follows:  Membrane Rupture Time/Date: 6:35 PM ,08/18/2020   Delivery Method:Vaginal, Spontaneous  Episiotomy: None  Lacerations:    Patient had an uncomplicated postpartum course.  She is ambulating, tolerating a regular diet, passing flatus, and urinating well. Patient is discharged home in stable condition on 08/20/20. Pt was discharged on norvasc 30m62maily.  Newborn Data: Birth date:08/18/2020  Birth time:7:05 PM  Gender:Female  Living status:Living  Apgars:8 ,9  Weight:3595 g   Magnesium Sulfate received: No BMZ received: No Rhophylac:N/A MMR:N/A T-DaP:Given prenatally Flu: Yes Transfusion:No  Physical exam  Vitals:   08/19/20 0611 08/19/20 1425 08/19/20 2008 08/20/20 0550  BP: 124/73 131/74 137/81 120/75  Pulse: 80 86 89 69  Resp: '16 16 16 18  ' Temp: 98.4 F (36.9 C) 98.3 F (36.8 C) 98.5 F (36.9 C) 98.3 F (36.8 C)  TempSrc: Oral Oral Oral Oral  SpO2: 99% 100% 100%   Weight:       General: alert, cooperative and no distress Lochia:  appropriate Uterine Fundus: firm Incision: N/A DVT Evaluation: No evidence of DVT seen on physical exam. No cords or calf tenderness. No significant calf/ankle edema. Labs: Lab Results  Component Value Date   WBC 13.2 (H) 08/19/2020   HGB 12.3 08/19/2020   HCT 35.4 (L) 08/19/2020   MCV 88.1 08/19/2020   PLT 209 08/19/2020   CMP Latest Ref Rng & Units 08/18/2020  Glucose 70 - 99 mg/dL 94  BUN 6 - 20 mg/dL 6  Creatinine 0.44 - 1.00 mg/dL 0.75  Sodium 135 - 145 mmol/L 133(L)  Potassium 3.5 - 5.1 mmol/L 5.1  Chloride 98 - 111 mmol/L 99  CO2 22 - 32 mmol/L 20(L)  Calcium 8.9 - 10.3 mg/dL 9.4  Total Protein 6.5 - 8.1 g/dL 7.0  Total Bilirubin 0.3 - 1.2 mg/dL 1.6(H)  Alkaline Phos 38 - 126 U/L 80  AST 15 - 41 U/L 48(H)  ALT 0 - 44 U/L 10   Edinburgh Score: Edinburgh Postnatal Depression Scale Screening Tool 08/19/2020  I have been able to laugh and see the funny side of things. 0  I have looked forward with enjoyment to things. 0  I have blamed myself unnecessarily when things went wrong. 0  I have been anxious or worried for no good reason. 0  I have felt scared or panicky for no good reason. 0  Things have been getting on top of me. 0  I have been so unhappy that I have had difficulty sleeping. 0  I have felt sad or miserable. 0  I have been so unhappy that I have been crying. 0  The thought of harming myself has occurred to me. 0  Edinburgh Postnatal Depression Scale Total 0     After visit meds:  Allergies as of 08/20/2020   No Known Allergies     Medication List    STOP taking these medications   aspirin 81 MG EC tablet     TAKE these medications   acetaminophen 325 MG tablet Commonly known as: Tylenol Take 2 tablets (650 mg total) by mouth every 6 (six) hours as needed for mild pain, moderate pain, fever or headache. What changed:   when to take this  reasons to take this   albuterol 108 (90 Base) MCG/ACT inhaler Commonly known as: VENTOLIN HFA Inhale  1-2 puffs into the lungs every 6 (six) hours as needed for wheezing. For wheezing and shortness of breath.   amLODipine 5 MG tablet Commonly known as: NORVASC Take 1 tablet (5 mg total) by mouth daily.   coconut oil Oil Apply 1 application topically as needed (nipple pain).   Flovent HFA 110 MCG/ACT inhaler Generic drug: fluticasone Inhale 1 puff into the lungs daily.   ibuprofen 600 MG tablet Commonly known as: ADVIL Take 1 tablet (600 mg total) by mouth every 8 (eight) hours as needed for moderate pain or cramping.   norethindrone 0.35 MG tablet Commonly known as: Ortho Micronor Take 1 tablet (0.35 mg total) by mouth daily.   prenatal vitamin w/FE, FA 27-1 MG Tabs tablet Take 1 tablet by mouth daily at 12 noon.        Discharge home in stable condition Infant Feeding: Breast Infant Disposition:home with mother Discharge instruction: per After Visit Summary and Postpartum booklet. Activity: Advance as tolerated. Pelvic rest for 6 weeks.  Diet: routine diet Future Appointments: Future Appointments  Date Time Provider [place]  08/20/2020 11:10 AM [facility] None  08/30/2020  9:00 AM [facility] NURSE [facility]  09/28/2020  8:35 AM Luvenia Redden, PA-C [facility]   Follow up Visit: Message sent to [facility] by Sylvester Harder 08/19/20.  Please schedule this patient for a In person postpartum visit in 6 weeks with the following provider: Any provider. Needs TSH at postpartum visit. Additional Postpartum F/U:BP check 1 week, TFTs at 6-8 weeks postpartum High risk pregnancy complicated by: HTN Delivery mode:  Vaginal, Spontaneous  Anticipated Birth Control:  POPs  Torryn Hudspeth, Gildardo Cranker, MD OB Fellow, Faculty Practice 08/20/2020 9:06 AM

## 2020-08-19 LAB — RPR
RPR Ser Ql: REACTIVE — AB
RPR Titer: 1:1 {titer}

## 2020-08-19 LAB — CBC
HCT: 35.4 % — ABNORMAL LOW (ref 36.0–46.0)
Hemoglobin: 12.3 g/dL (ref 12.0–15.0)
MCH: 30.6 pg (ref 26.0–34.0)
MCHC: 34.7 g/dL (ref 30.0–36.0)
MCV: 88.1 fL (ref 80.0–100.0)
Platelets: 209 10*3/uL (ref 150–400)
RBC: 4.02 MIL/uL (ref 3.87–5.11)
RDW: 13.1 % (ref 11.5–15.5)
WBC: 13.2 10*3/uL — ABNORMAL HIGH (ref 4.0–10.5)
nRBC: 0 % (ref 0.0–0.2)

## 2020-08-19 NOTE — Progress Notes (Signed)
Pt states she is currently undecided about contraception.  She is between the IUD and pills.  Will make a decision when she goes to her PP F/U.

## 2020-08-19 NOTE — Progress Notes (Signed)
Post Partum Day 1 Subjective:  Patient is doing well without complaints. Ambulating without difficulty. Voiding and passing flatus. Tolerating PO. Abdominal pain improved. Vaginal bleeding decreased.  Objective: Blood pressure 124/73, pulse 80, temperature 98.4 F (36.9 C), temperature source Oral, resp. rate 16, weight 120 kg, last menstrual period 11/21/2019, SpO2 99 %, unknown if currently breastfeeding.  Physical Exam:  General: alert, cooperative and no distress Lochia: appropriate Uterine Fundus: firm Incision: n/a DVT Evaluation: No evidence of DVT seen on physical exam.  Recent Labs    08/18/20 1924 08/19/20 0532  HGB 14.7 12.3  HCT 43.2 35.4*    Assessment/Plan: PPD#1  -doing well, meeting pp milestones  -VSS  -breastfeeding, doing well  -EBL 950, hgb stable 12.3, asymptomatic  -discussed contraception, POPs for now, deciding on liletta outpt  #cHTN  -asymptomatic  -norvasc 5mg  during pregnancy, not continued postpartum  -BP stable, continue to monitor  #Hyperthyroid  -no meds  -tsh @pp  visit  Plan for discharge tomorrow given time of delivery.   LOS: 1 day   08/19/2020, 6:53 AM

## 2020-08-19 NOTE — Discharge Instructions (Signed)
-take tylenol 1000 mg every 6 hours as needed for pain, alternate with ibuprofen 600 mg every 6 hours -drink plenty of water to help with breastfeeding -continue prenatal vitamins while you are breastfeeding -think about birth control options-->bedisider.org is a great website!   Postpartum Care After Vaginal Delivery The following information offers guidance about how to care for yourself from the time you deliver your baby to 6-12 weeks after delivery (postpartum period). If you have problems or questions, contact your health care provider for more specific instructions. Follow these instructions at home: Vaginal bleeding  It is normal to have vaginal bleeding (lochia) after delivery. Wear a sanitary pad for bleeding and discharge. ? During the first week after delivery, the amount and appearance of lochia is often similar to a menstrual period. ? Over the next few weeks, it will gradually decrease to a dry, yellow-brown discharge. ? For most women, lochia stops completely by 4-6 weeks after delivery, but can vary.  Change your sanitary pads frequently. Watch for any changes in your flow, such as: ? A sudden increase in volume. ? A change in color. ? Large blood clots.  If you pass a blood clot from your vagina, save it and call your health care provider. Do not flush blood clots down the toilet before talking with your health care provider.  Do not use tampons or douches until your health care provider approves.  If you are not breastfeeding, your period should return 6-8 weeks after delivery. If you are feeding your baby breast milk only, your period may not return until you stop breastfeeding. Perineal care  Keep the area between the vagina and the anus (perineum) clean and dry. Use medicated pads and pain-relieving sprays and creams as directed.  If you had a surgical cut in the perineum (episiotomy) or a tear, check the area for signs of infection until you are healed. Check  for: ? More redness, swelling, or pain. ? Fluid or blood coming from the cut or tear. ? Warmth. ? Pus or a bad smell.  You may be given a squirt bottle to use instead of wiping to clean the perineum area after you use the bathroom. Pat the area gently to dry it.  To relieve pain caused by an episiotomy, a tear, or swollen veins in the anus (hemorrhoids), take a warm sitz bath 2-3 times a day. In a sitz bath, the warm water should only come up to your hips and cover your buttocks.   Breast care  In the first few days after delivery, your breasts may feel heavy, full, and uncomfortable (breast engorgement). Milk may also leak from your breasts. Ask your health care provider about ways to help relieve the discomfort.  If you are breastfeeding: ? Wear a bra that supports your breasts and fits well. Use breast pads to absorb milk that leaks. ? Keep your nipples clean and dry. Apply creams and ointments as told. ? You may have uterine contractions every time you breastfeed for up to several weeks after delivery. This helps your uterus return to its normal size. ? If you have any problems with breastfeeding, notify your health care provider or lactation consultant.  If you are not breastfeeding: ? Avoid touching your breasts. Do not squeeze out (express) milk. Doing this can make your breasts produce more milk. ? Wear a good-fitting bra and use cold packs to help with swelling. Intimacy and sexuality  Ask your health care provider when you can engage in sexual  activity. This may depend upon: ? Your risk of infection. ? How fast you are healing. ? Your comfort and desire to engage in sexual activity.  You are able to get pregnant after delivery, even if you have not had your period. Talk with your health care provider about methods of birth control (contraception) or family planning if you desire future pregnancies. Medicines  Take over-the-counter and prescription medicines only as told by  your health care provider.  Take an over-the-counter stool softener to help ease bowel movements as told by your health care provider.  If you were prescribed an antibiotic medicine, take it as told by your health care provider. Do not stop taking the antibiotic even if you start to feel better.  Review all previous and current prescriptions to check for possible transfer into breast milk. Activity  Gradually return to your normal activities as told by your health care provider.  Rest as much as possible. Nap while your baby is sleeping. Eating and drinking  Drink enough fluid to keep your urine pale yellow.  To help prevent or relieve constipation, eat high-fiber foods every day.  Choose healthy eating to support breastfeeding or weight loss goals.  Take your prenatal vitamins until your health care provider tells you to stop.   General tips/recommendations  Do not use any products that contain nicotine or tobacco. These products include cigarettes, chewing tobacco, and vaping devices, such as e-cigarettes. If you need help quitting, ask your health care provider.  Do not drink alcohol, especially if you are breastfeeding.  Do not take medications or drugs that are not prescribed to you, especially if you are breastfeeding.  Visit your health care provider for a postpartum checkup within the first 3-6 weeks after delivery.  Complete a comprehensive postpartum visit no later than 12 weeks after delivery.  Keep all follow-up visits for you and your baby. Contact a health care provider if:  You feel unusually sad or worried.  Your breasts become red, painful, or hard.  You have a fever or other signs of an infection.  You have bleeding that is soaking through one pad an hour or you have blood clots.  You have a severe headache that doesn't go away or you have vision changes.  You have nausea and vomiting and are unable to eat or drink anything for 24 hours. Get help right  away if:  You have chest pain or difficulty breathing.  You have sudden, severe leg pain.  You faint or have a seizure.  You have thoughts about hurting yourself or your baby. If you ever feel like you may hurt yourself or others, or have thoughts about taking your own life, get help right away. Go to your nearest emergency department or:  Call your local emergency services (911 in the [place]  The National Suicide Prevention Lifeline at 9155470585. This suicide crisis helpline is open 24 hours a day.  Text the Crisis Text Line at 503 549 0901 (in the [place] Summary  The period of time after you deliver your newborn up to 6-12 weeks after delivery is called the postpartum period.  Keep all follow-up visits for you and your baby.  Review all previous and current prescriptions to check for possible transfer into breast milk.  Contact a health care provider if you feel unusually sad or worried during the postpartum period. This information is not intended to replace advice given to you by your health care provider. Make sure you discuss any questions you have  with your health care provider. Document Revised: 02/23/2020 Document Reviewed: 02/23/2020 Elsevier Patient Education  2021 [employer].

## 2020-08-19 NOTE — Lactation Note (Signed)
This note was copied from a baby's chart. Lactation Consultation Note  Patient Name: Girl Janna Oak [facility] Date: 08/19/2020   Age:30 hours   LC Follow Up Note:  Per RN, mother declines a lactation consult: see RN note for further details.   Maternal Data    Feeding    LATCH Score                    Lactation Tools Discussed/Used    Interventions    Discharge    Consult Status      Beth R DelFava 08/19/2020, 11:38 AM

## 2020-08-20 ENCOUNTER — Other Ambulatory Visit: Payer: Self-pay | Admitting: Obstetrics and Gynecology

## 2020-08-20 ENCOUNTER — Other Ambulatory Visit (HOSPITAL_COMMUNITY): Payer: Medicaid Other | Attending: Family Medicine

## 2020-08-20 MED ORDER — ACETAMINOPHEN 325 MG PO TABS: 650.0000 mg | ORAL_TABLET | Freq: Four times a day (QID) | ORAL | Status: DC | PRN

## 2020-08-20 MED ORDER — AMLODIPINE BESYLATE 5 MG PO TABS: 5.0000 mg | ORAL_TABLET | Freq: Every day | ORAL | 1 refills | Status: DC

## 2020-08-20 MED ORDER — NORETHINDRONE 0.35 MG PO TABS: 1.0000 | ORAL_TABLET | Freq: Every day | ORAL | 6 refills | Status: DC

## 2020-08-20 MED ORDER — IBUPROFEN 600 MG PO TABS: 600.0000 mg | ORAL_TABLET | Freq: Three times a day (TID) | ORAL | 0 refills | Status: DC | PRN

## 2020-08-20 MED ORDER — COCONUT OIL OIL: 1.0000 "application " | TOPICAL_OIL | 0 refills | Status: DC | PRN

## 2020-08-20 MED FILL — IBUPROFEN 600 MG TABLET: 600 | 10 days supply | Qty: 30 | Fill #0

## 2020-08-20 MED FILL — NORETHINDRONE 0.35 MG TAB: 0.35 | 56 days supply | Qty: 56 | Fill #0

## 2020-08-20 NOTE — Plan of Care (Signed)
Discharge instructions given with after visit summary, pt receptive.  

## 2020-08-21 DIAGNOSIS — Z419 Encounter for procedure for purposes other than remedying health state, unspecified: Secondary | ICD-10-CM | POA: Diagnosis not present

## 2020-08-21 LAB — FLUORESCENT TREPONEMAL AB(FTA)-IGG-BLD: Fluorescent Treponemal Ab, IgG: NONREACTIVE

## 2020-08-21 LAB — T.PALLIDUM AB, TOTAL: T Pallidum Abs: NONREACTIVE

## 2020-08-22 ENCOUNTER — Inpatient Hospital Stay (HOSPITAL_COMMUNITY): Admission: AD | Admit: 2020-08-22 | Payer: Medicaid Other | Source: Home / Self Care | Admitting: Family Medicine

## 2020-08-22 ENCOUNTER — Inpatient Hospital Stay (HOSPITAL_COMMUNITY): Payer: Medicaid Other

## 2020-08-30 ENCOUNTER — Ambulatory Visit: Payer: Medicaid Other

## 2020-09-04 ENCOUNTER — Other Ambulatory Visit: Payer: Self-pay | Admitting: Medical

## 2020-09-04 ENCOUNTER — Telehealth: Payer: Self-pay

## 2020-09-04 DIAGNOSIS — J45909 Unspecified asthma, uncomplicated: Secondary | ICD-10-CM

## 2020-09-04 DIAGNOSIS — O99511 Diseases of the respiratory system complicating pregnancy, first trimester: Secondary | ICD-10-CM

## 2020-09-04 DIAGNOSIS — O10919 Unspecified pre-existing hypertension complicating pregnancy, unspecified trimester: Secondary | ICD-10-CM

## 2020-09-04 NOTE — Telephone Encounter (Signed)
Call received from [facility] nurse reporting normal blood pressure postpartum. BP at home was 122/70. Pt requested that home visiting RN communicate this to OB/GYN provider.

## 2020-09-07 ENCOUNTER — Other Ambulatory Visit: Payer: Self-pay | Admitting: Lactation Services

## 2020-09-07 MED ORDER — ALBUTEROL SULFATE HFA 108 (90 BASE) MCG/ACT IN AERS: 2.0000 | INHALATION_SPRAY | Freq: Four times a day (QID) | RESPIRATORY_TRACT | 2 refills | Status: DC | PRN

## 2020-09-07 MED ORDER — PRENATAL PLUS 27-1 MG PO TABS: 1.0000 | ORAL_TABLET | Freq: Every day | ORAL | 11 refills | Status: DC

## 2020-09-17 IMAGING — US OBSTETRIC <14 WK ULTRASOUND
1 series · 15 of 28 positions shown · non-contrast
Comparison: None.

CLINICAL DATA: Left lower quadrant pain

EXAM:
OBSTETRIC <14 WK ULTRASOUND
TECHNIQUE: Transabdominal ultrasound was performed for evaluation of the
gestation as well as the maternal uterus and adnexal regions.

[Series 1: obstetric <14 wk ultrasound · 15 of 30 slices shown]
[im 1/30]
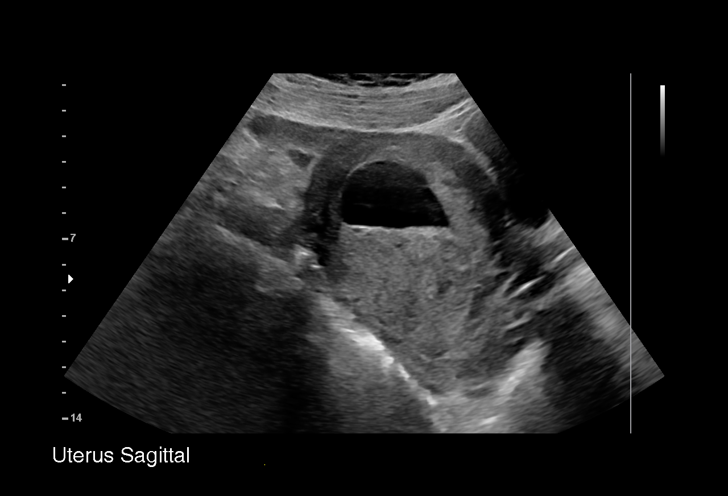
[im 3/30]
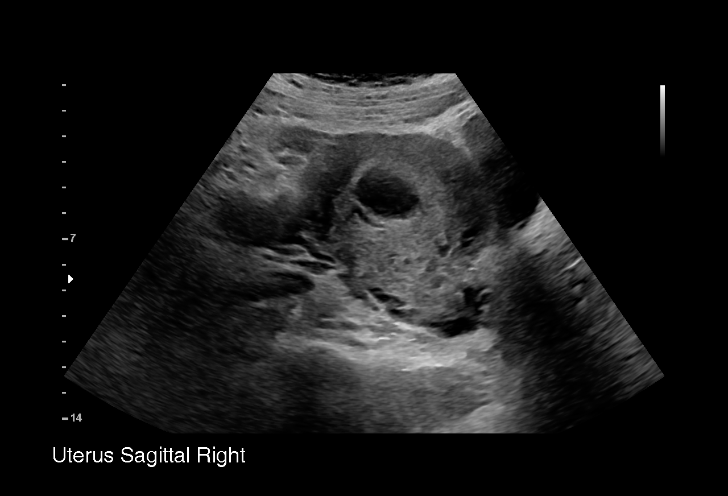
[im 5/30]
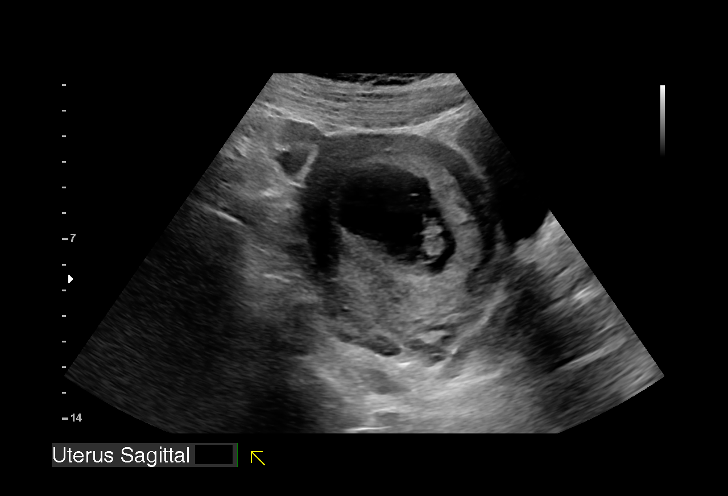
[im 7/30]
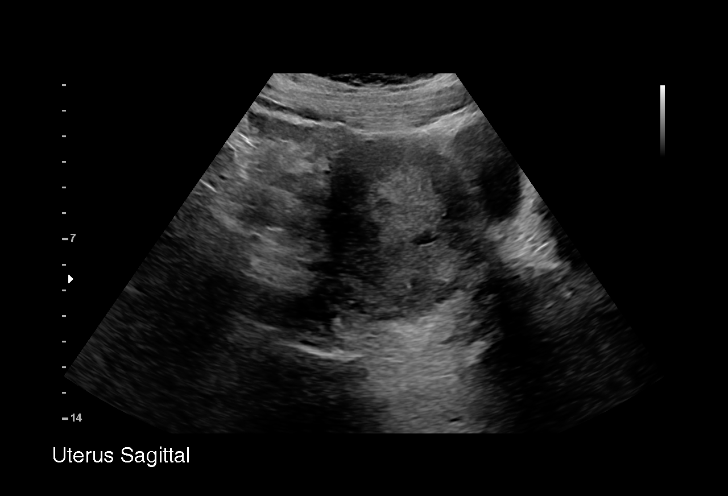
[im 9/30]
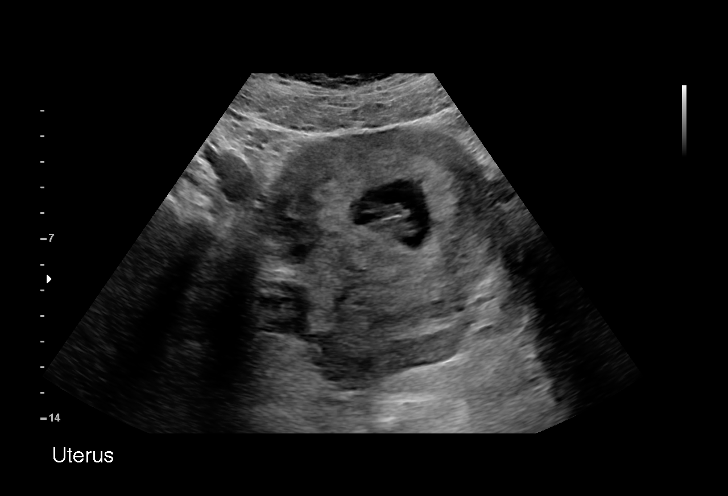
[im 11/30]
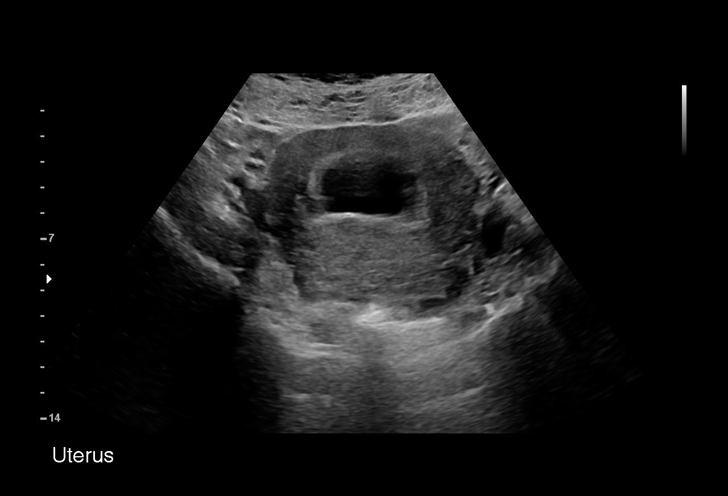
[im 13/30]
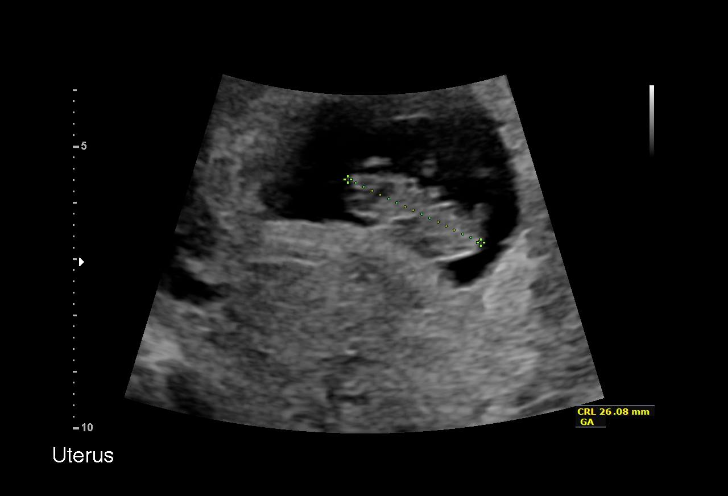
[im 16/30]
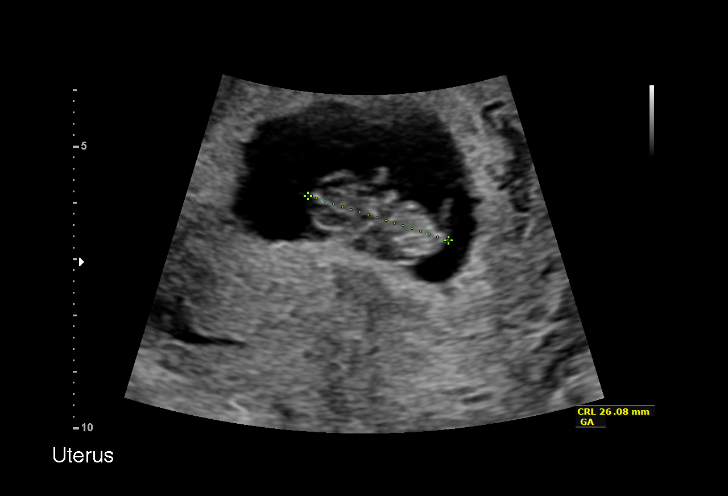
[im 17/30]
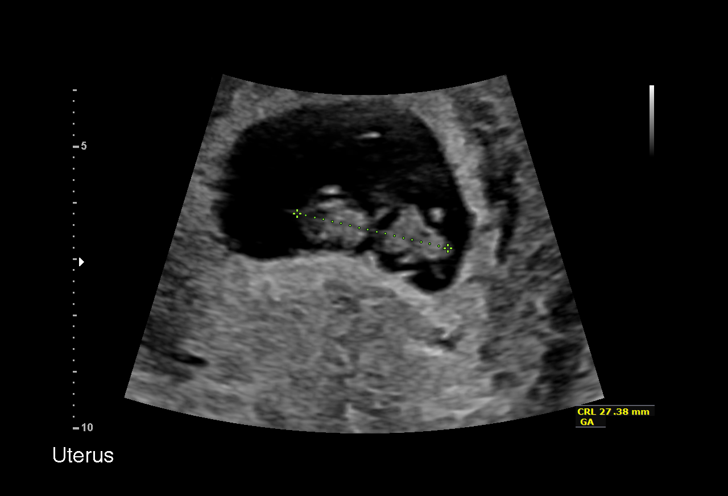
[im 19/30]
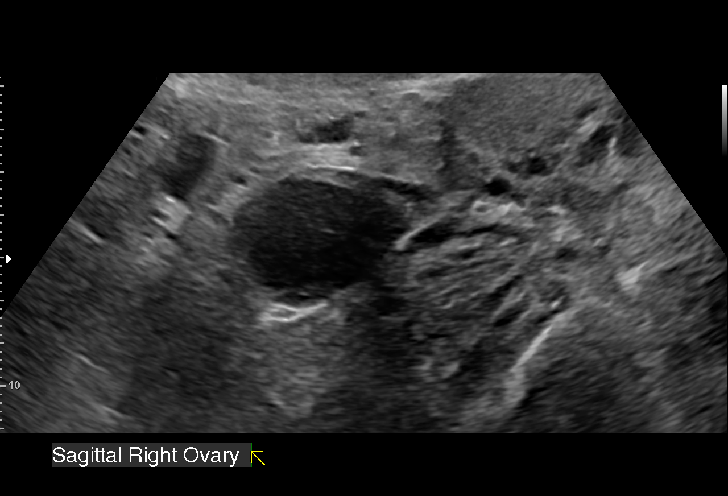
[im 21/30]
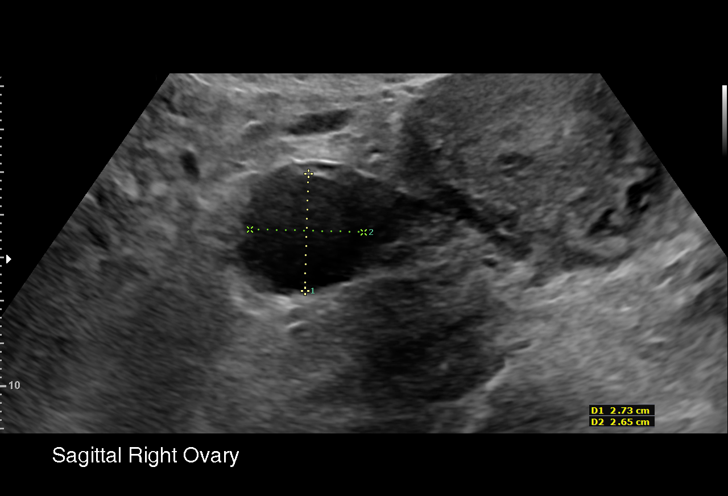
[im 23/30]
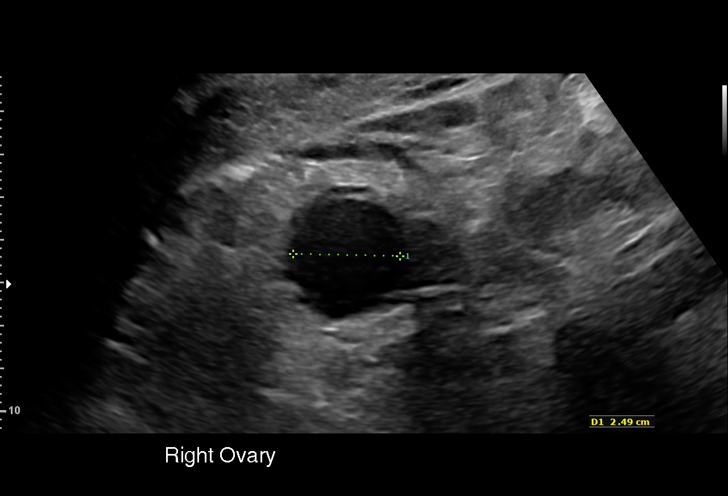
[im 25/30]
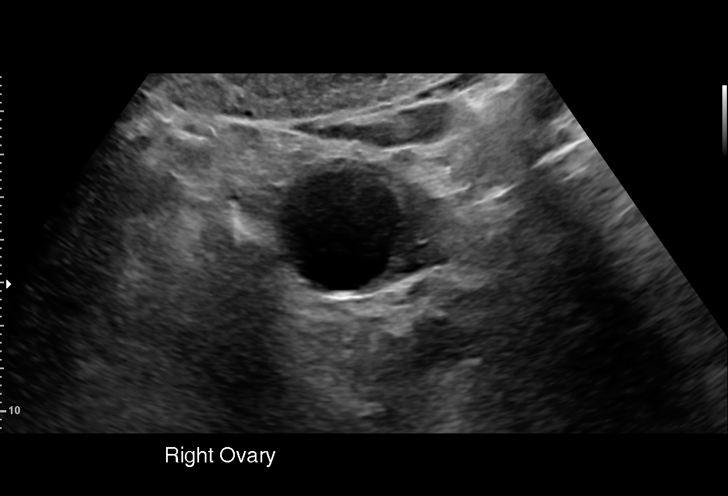
[im 27/30]
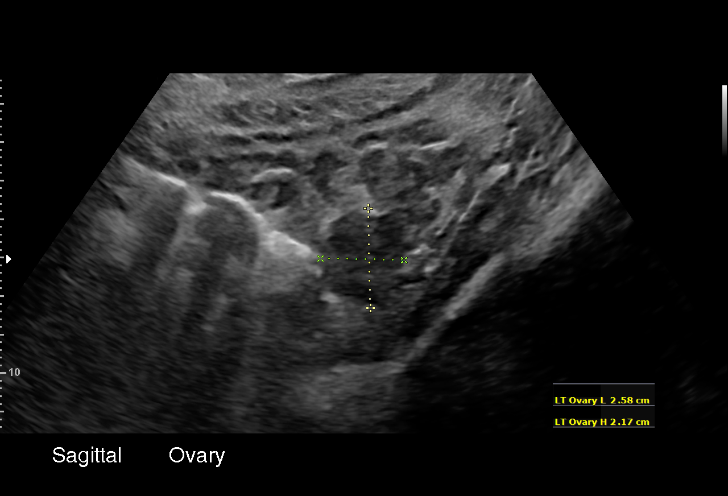
[im 30/30]
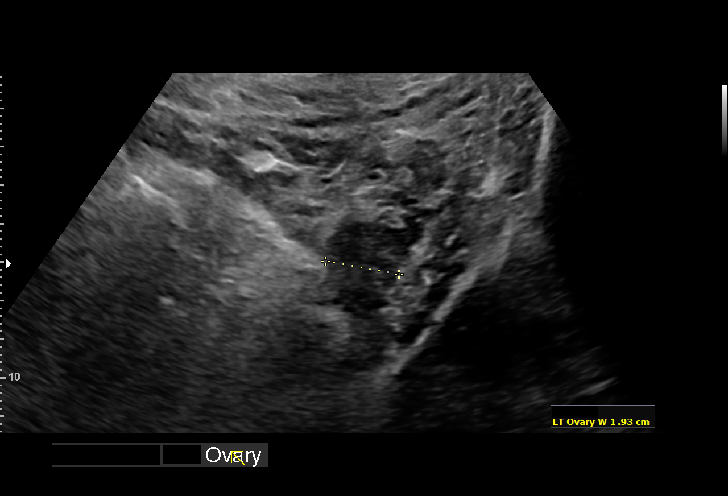

[15 of 28 positions shown; findings below may reference images not displayed]

FINDINGS: Intrauterine gestational sac: Single

Yolk sac:  Visualized

Embryo:  Visualized

Cardiac Activity: Visualized

Heart Rate: 169 bpm

MSD:    mm    w     d

CRL:   26.5 mm   9 w 3 d                  US EDC: 09/19/2019

Subchorionic hemorrhage:  None visualized.

Maternal uterus/adnexae: No adnexal mass or free fluid
IMPRESSION: Nine week 3 day intrauterine pregnancy. Fetal heart rate 169 beats
per minute. No acute maternal findings.

## 2020-09-18 ENCOUNTER — Encounter: Payer: Self-pay | Admitting: General Practice

## 2020-09-21 DIAGNOSIS — Z419 Encounter for procedure for purposes other than remedying health state, unspecified: Secondary | ICD-10-CM | POA: Diagnosis not present

## 2020-09-28 ENCOUNTER — Encounter: Payer: Self-pay | Admitting: Medical

## 2020-09-28 ENCOUNTER — Ambulatory Visit: Payer: Medicaid Other | Admitting: Medical

## 2020-09-28 DIAGNOSIS — J45909 Unspecified asthma, uncomplicated: Secondary | ICD-10-CM | POA: Insufficient documentation

## 2020-09-28 DIAGNOSIS — I10 Essential (primary) hypertension: Secondary | ICD-10-CM | POA: Insufficient documentation

## 2020-09-28 DIAGNOSIS — E0590 Thyrotoxicosis, unspecified without thyrotoxic crisis or storm: Secondary | ICD-10-CM | POA: Insufficient documentation

## 2020-10-21 DIAGNOSIS — Z419 Encounter for procedure for purposes other than remedying health state, unspecified: Secondary | ICD-10-CM | POA: Diagnosis not present

## 2020-11-21 DIAGNOSIS — Z419 Encounter for procedure for purposes other than remedying health state, unspecified: Secondary | ICD-10-CM | POA: Diagnosis not present

## 2020-12-03 DIAGNOSIS — Z8639 Personal history of other endocrine, nutritional and metabolic disease: Secondary | ICD-10-CM | POA: Diagnosis not present

## 2020-12-03 DIAGNOSIS — Z114 Encounter for screening for human immunodeficiency virus [HIV]: Secondary | ICD-10-CM | POA: Diagnosis not present

## 2020-12-03 DIAGNOSIS — D573 Sickle-cell trait: Secondary | ICD-10-CM | POA: Diagnosis not present

## 2020-12-03 DIAGNOSIS — Z13220 Encounter for screening for lipoid disorders: Secondary | ICD-10-CM | POA: Diagnosis not present

## 2020-12-03 DIAGNOSIS — R300 Dysuria: Secondary | ICD-10-CM | POA: Diagnosis not present

## 2020-12-03 DIAGNOSIS — Z130 Encounter for screening for diseases of the blood and blood-forming organs and certain disorders involving the immune mechanism: Secondary | ICD-10-CM | POA: Diagnosis not present

## 2020-12-03 DIAGNOSIS — I10 Essential (primary) hypertension: Secondary | ICD-10-CM | POA: Diagnosis not present

## 2020-12-03 DIAGNOSIS — Z3200 Encounter for pregnancy test, result unknown: Secondary | ICD-10-CM | POA: Diagnosis not present

## 2020-12-03 DIAGNOSIS — Z0189 Encounter for other specified special examinations: Secondary | ICD-10-CM | POA: Diagnosis not present

## 2020-12-03 DIAGNOSIS — Z113 Encounter for screening for infections with a predominantly sexual mode of transmission: Secondary | ICD-10-CM | POA: Diagnosis not present

## 2020-12-03 DIAGNOSIS — Z131 Encounter for screening for diabetes mellitus: Secondary | ICD-10-CM | POA: Diagnosis not present

## 2020-12-03 DIAGNOSIS — J45909 Unspecified asthma, uncomplicated: Secondary | ICD-10-CM | POA: Diagnosis not present

## 2020-12-21 DIAGNOSIS — Z419 Encounter for procedure for purposes other than remedying health state, unspecified: Secondary | ICD-10-CM | POA: Diagnosis not present

## 2021-01-21 DIAGNOSIS — Z419 Encounter for procedure for purposes other than remedying health state, unspecified: Secondary | ICD-10-CM | POA: Diagnosis not present

## 2021-02-10 ENCOUNTER — Other Ambulatory Visit: Payer: Self-pay | Admitting: Medical

## 2021-02-10 DIAGNOSIS — O10919 Unspecified pre-existing hypertension complicating pregnancy, unspecified trimester: Secondary | ICD-10-CM

## 2021-02-21 DIAGNOSIS — Z419 Encounter for procedure for purposes other than remedying health state, unspecified: Secondary | ICD-10-CM | POA: Diagnosis not present

## 2021-03-16 IMAGING — US US MFM FETAL BPP W/O NON-STRESS
1 series · 15 of 20 positions shown · non-contrast
Comparison: none

[Series 1: us mfm fetal bpp w/o non-stress · 20 acquisitions, 15 frames shown]
[im 1/20]
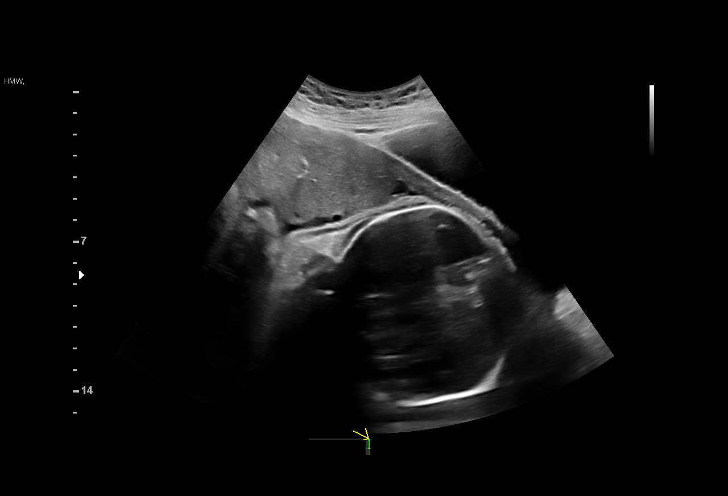
[im 3/20]
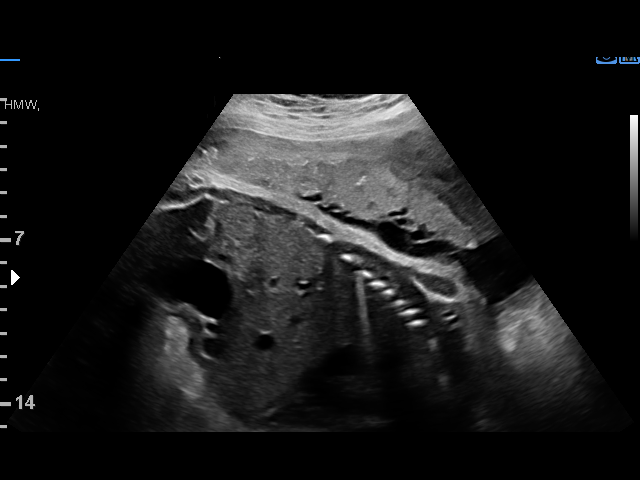
[im 4/20]
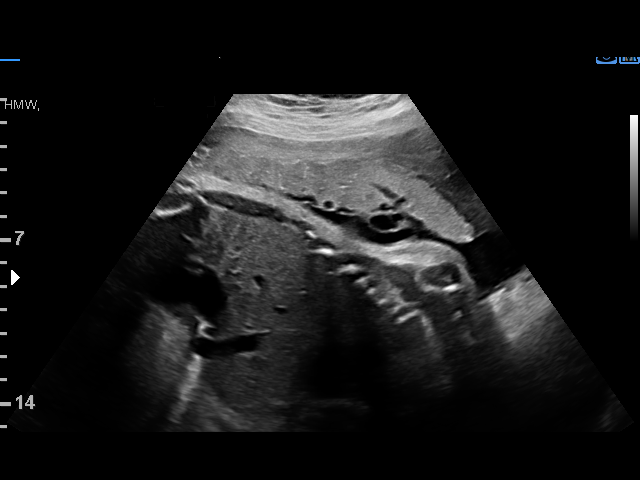
[im 5/20]
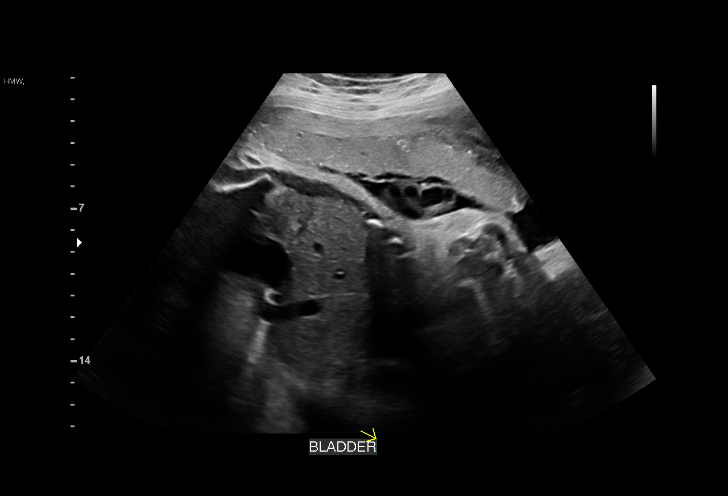
[im 7/20]
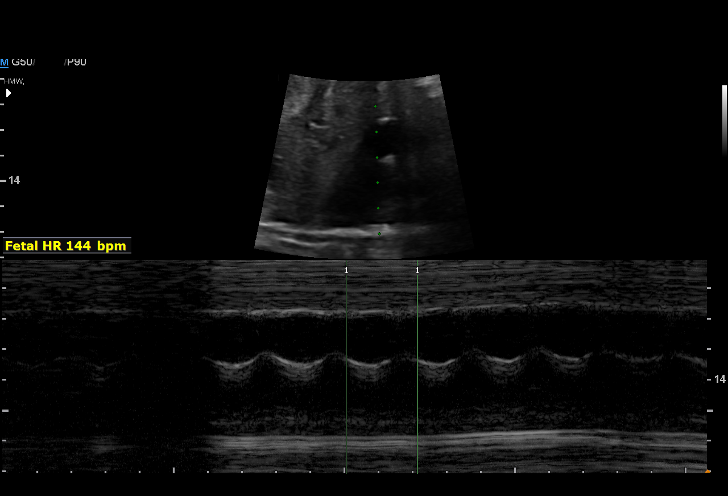
[im 8/20]
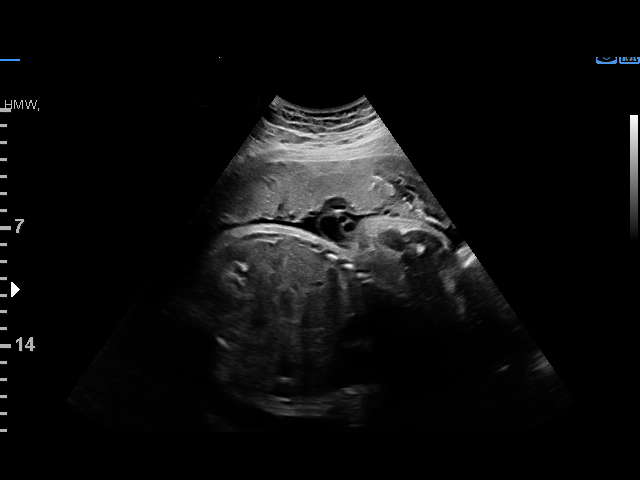
[im 9/20]
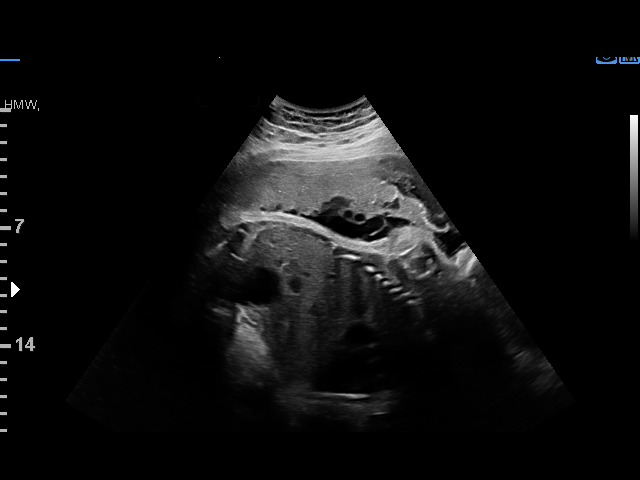
[im 11/20]
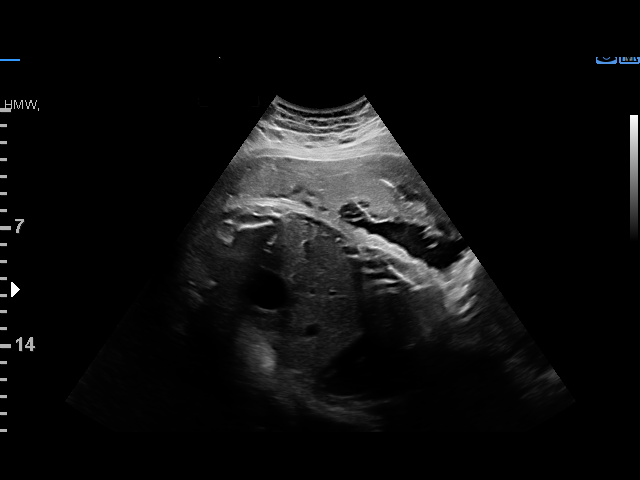
[im 12/20]
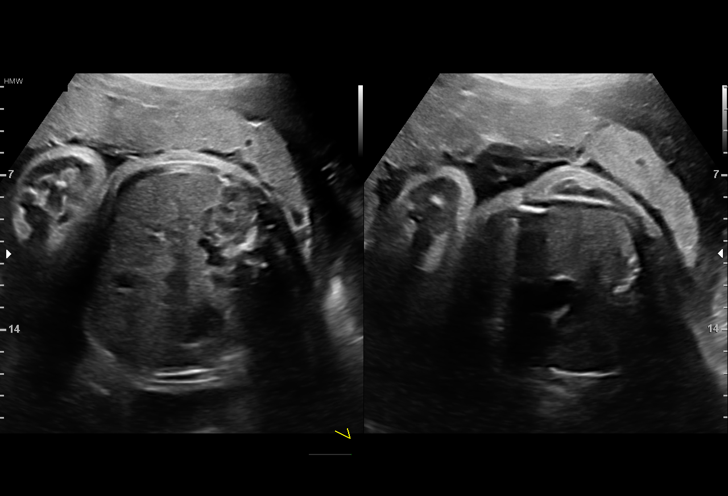
[im 13/20]
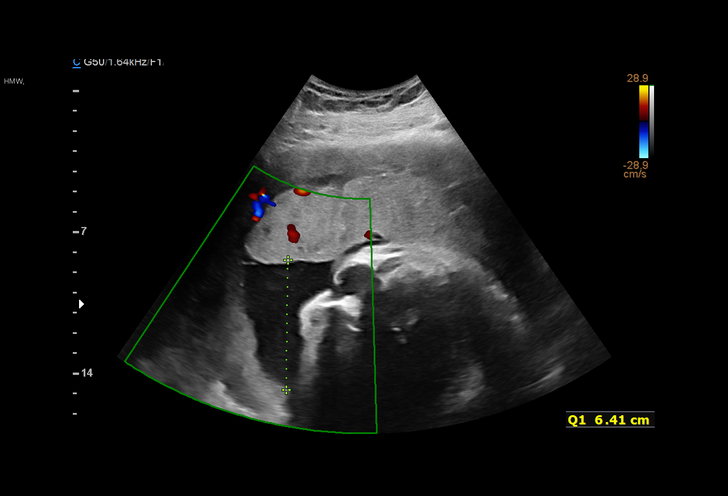
[im 15/20]
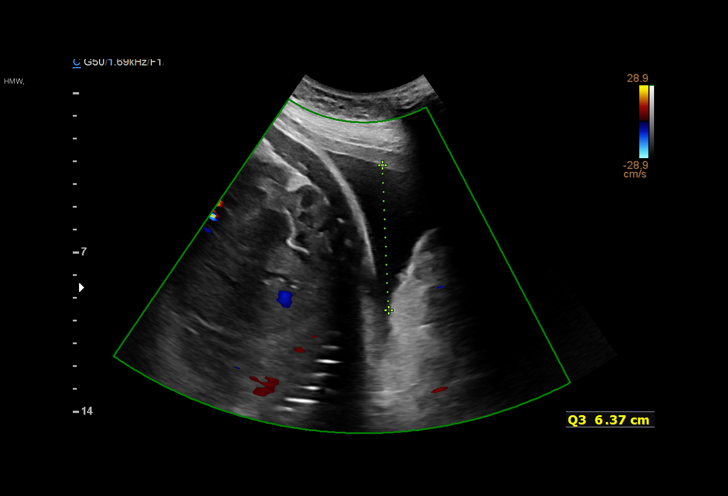
[im 16/20]
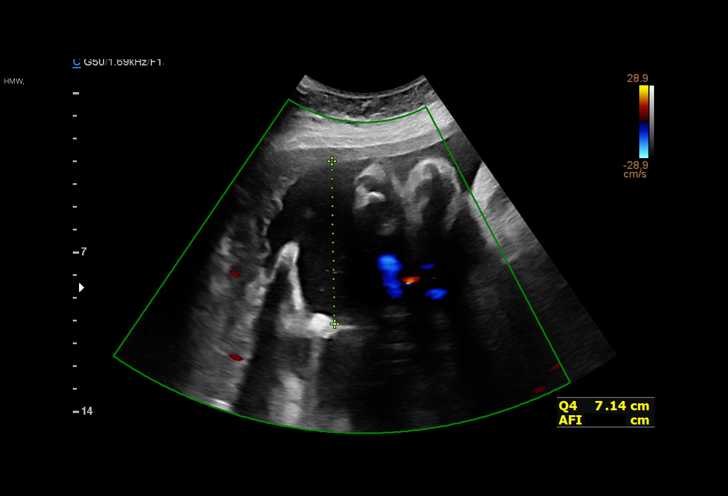
[im 17/20]
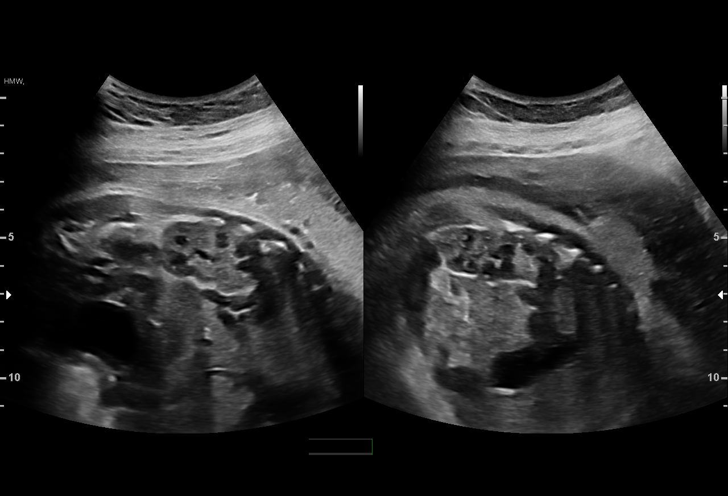
[im 19/20]
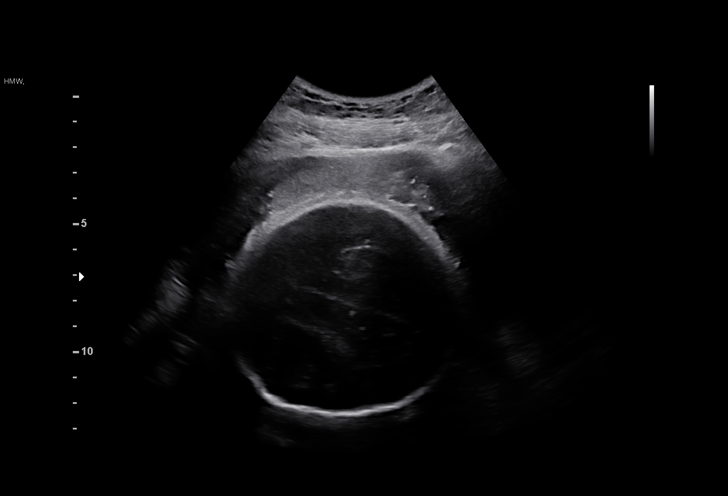
[im 20/20]
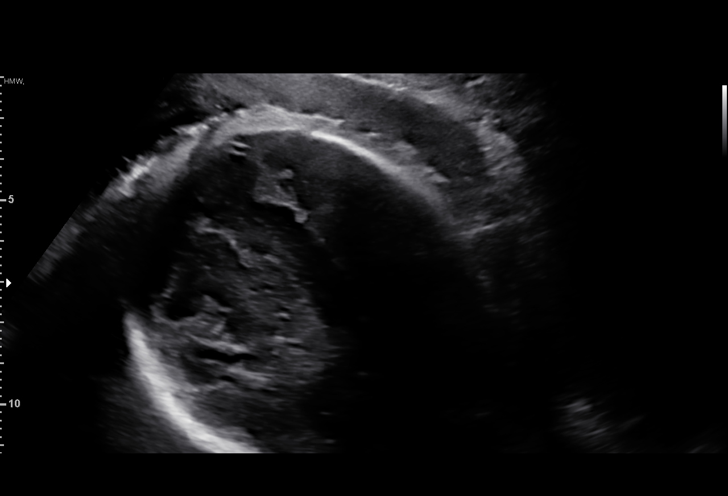

[15 of 20 positions shown; findings below may reference images not displayed]

----------------------------------------------------------------------

 ----------------------------------------------------------------------
Indications

  Hypertension - Chronic/Pre-existing
  (labetalol)
  35 weeks gestation of pregnancy
  History of sickle cell trait (abnormal Natera -4ZO.4
  carrier)
  Obesity complicating pregnancy, second
  trimester (BMI 34)
  Hyperthyroid
  Asthma - (albuterol only)                      2WW.9W j94.474
  Low Risk NIPS
 ----------------------------------------------------------------------
Vital Signs

                                                Height:        5'7"
Fetal Evaluation

 Num Of Fetuses:         1
 Cardiac Activity:       Observed
 Presentation:           Cephalic

 Amniotic Fluid
 AFI FV:      Subjectively increased

 AFI Sum(cm)     %Tile       Largest Pocket(cm)
 23.1            88

 RUQ(cm)       RLQ(cm)       LUQ(cm)        LLQ(cm)

Biophysical Evaluation

 Amniotic F.V:   Within normal limits       F. Tone:        Observed
 F. Movement:    Observed                   Score:          [DATE]
 F. Breathing:   Observed
OB History

 Gravidity:    3         Term:   0
Gestational Age

 Best:          35w 1d     Det. By:  Early Ultrasound         EDD:   09/19/19
                                     (02/17/19)
Anatomy

 Thoracic:              Appears normal         Spine:                  Appears normal
 Stomach:               Appears normal, left
                        sided
Cervix Uterus Adnexa

 Cervix
 Not visualized (advanced GA >02wks)
Comments

 This patient was seen for a biophysical profile due to a history
 of chronic hypertension currently treated with labetalol.  She
 denies any problems since her last exam.  Her blood
 pressure today was within normal limits (125/64).
 A biophysical profile performed today was [DATE].
 There was normal amniotic fluid noted on today's ultrasound
 exam.
 Another biophysical profile was scheduled in 1 week.

## 2021-03-22 DIAGNOSIS — J45909 Unspecified asthma, uncomplicated: Secondary | ICD-10-CM | POA: Diagnosis not present

## 2021-03-22 DIAGNOSIS — I10 Essential (primary) hypertension: Secondary | ICD-10-CM | POA: Diagnosis not present

## 2021-03-22 DIAGNOSIS — Z130 Encounter for screening for diseases of the blood and blood-forming organs and certain disorders involving the immune mechanism: Secondary | ICD-10-CM | POA: Diagnosis not present

## 2021-03-23 DIAGNOSIS — Z419 Encounter for procedure for purposes other than remedying health state, unspecified: Secondary | ICD-10-CM | POA: Diagnosis not present

## 2021-04-23 DIAGNOSIS — Z419 Encounter for procedure for purposes other than remedying health state, unspecified: Secondary | ICD-10-CM | POA: Diagnosis not present

## 2021-04-30 DIAGNOSIS — Z20822 Contact with and (suspected) exposure to covid-19: Secondary | ICD-10-CM | POA: Diagnosis not present

## 2021-04-30 DIAGNOSIS — Z03818 Encounter for observation for suspected exposure to other biological agents ruled out: Secondary | ICD-10-CM | POA: Diagnosis not present

## 2021-05-01 NOTE — Progress Notes (Deleted)
Synopsis: Referred for *** by No ref. provider found  Subjective:   PATIENT ID: Kathleen [name] GENDER: female DOB: [birth year], MRN: [id number]  No chief complaint on file.  30yF with asthma, hyperthyroid in setting of pregnancy of unclear etiology, HTN, sickle cell trait ,never smoker   On flovent 110 daily, SABA  Otherwise pertinent review of systems is negative.  Past Medical History:  Diagnosis Date   Asthma    Hypertension    Hyperthyroidism      Family History  Problem Relation Age of Onset   Cancer Mother    Hypertension Mother      Past Surgical History:  Procedure Laterality Date   DILATION AND EVACUATION N/A 12/16/2012   Procedure: DILATATION AND EVACUATION;  Surgeon: Serita Kyle, MD;  Location: [facility];  Service: Gynecology;  Laterality: N/A;   WISDOM TOOTH EXTRACTION  2009    Social History   Socioeconomic History   Marital status: Single    Spouse name: Not on file   Number of children: Not on file   Years of education: Not on file   Highest education level: Not on file  Occupational History   Not on file  Tobacco Use   Smoking status: Former   Smokeless tobacco: Never  Vaping Use   Vaping Use: Never used  Substance and Sexual Activity   Alcohol use: No   Drug use: Not Currently    Types: Marijuana    Comment: last smoked over a yr ago   Sexual activity: Yes    Birth control/protection: None  Other Topics Concern   Not on file  Social History Narrative   Not on file   Social Determinants of Health   Financial Resource Strain: Not on file  Food Insecurity: No Food Insecurity   Worried About Running Out of Food in the Last Year: Never true   Ran Out of Food in the Last Year: Never true  Transportation Needs: No Transportation Needs   Lack of Transportation (Medical): No   Lack of Transportation (Non-Medical): No  Physical Activity: Not on file  Stress: Not on file  Social Connections: Not on file  Intimate Partner Violence:  Not on file     No Known Allergies   Outpatient Medications Prior to Visit  Medication Sig Dispense Refill   acetaminophen (TYLENOL) 325 MG tablet Take 2 tablets (650 mg total) by mouth every 6 (six) hours as needed for mild pain, moderate pain, fever or headache.     albuterol (VENTOLIN HFA) 108 (90 Base) MCG/ACT inhaler Inhale 2 puffs into the lungs every 6 (six) hours as needed for wheezing or shortness of breath. 8 g 2   amLODipine (NORVASC) 5 MG tablet TAKE 1 TABLET (5 MG TOTAL) BY MOUTH DAILY. 90 tablet 0   amLODipine (NORVASC) 5 MG tablet TAKE 1 TABLET (5 MG TOTAL) BY MOUTH DAILY. 45 tablet 1   coconut oil OIL Apply 1 application topically as needed (nipple pain).  0   fluticasone (FLOVENT HFA) 110 MCG/ACT inhaler Inhale 1 puff into the lungs daily. (Patient not taking: No sig reported) 1 each 12   ibuprofen (ADVIL) 600 MG tablet Take 1 tablet (600 mg total) by mouth every 8 (eight) hours as needed for moderate pain or cramping. 30 tablet 0   ibuprofen (ADVIL) 600 MG tablet TAKE 1 TABLET (600 MG TOTAL) BY MOUTH EVERY 8 (EIGHT) HOURS AS NEEDED FOR MODERATE PAIN OR CRAMPING. 30 tablet 0   norethindrone (MICRONOR) 0.35 MG  tablet TAKE 1 TABLET (0.35 MG TOTAL) BY MOUTH DAILY. 56 tablet 6   norethindrone (ORTHO MICRONOR) 0.35 MG tablet Take 1 tablet (0.35 mg total) by mouth daily. 56 tablet 6   prenatal vitamin w/FE, FA (PRENATAL 1 + 1) 27-1 MG TABS tablet Take 1 tablet by mouth daily at 12 noon. 30 tablet 11   PROAIR HFA 108 (90 Base) MCG/ACT inhaler INHALE 1-2 PUFFS INTO THE LUNGS EVERY 6 (SIX) HOURS AS NEEDED FOR WHEEZING AND SHORTNESS OF BREATH 8.5 each 0   No facility-administered medications prior to visit.       Objective:   Physical Exam:  General appearance: 30 y.o., female, NAD, conversant, female, NAD, conversant  Eyes: anicteric sclerae, moist conjunctivae; no lid-lag; PERRL, tracking appropriately HENT: NCAT; oropharynx, MMM, no mucosal ulcerations; normal hard and soft palate Neck: Trachea  midline; no lymphadenopathy, no JVD Lungs: CTAB, no crackles, no wheeze, with normal respiratory effort CV: RRR, no MRGs  Abdomen: Soft, non-tender; non-distended, BS present  Extremities: No peripheral edema, radial and DP pulses present bilaterally  Skin: Normal temperature, turgor and texture; no rash Psych: Appropriate affect Neuro: Alert and oriented to person and place, no focal deficit    There were no vitals filed for this visit.   on *** LPM *** RA BMI Readings from Last 3 Encounters:  08/18/20 41.43 kg/m  08/16/20 41.41 kg/m  08/02/20 40.55 kg/m   Wt Readings from Last 3 Encounters:  08/18/20 264 lb 8.8 oz (120 kg)  08/16/20 264 lb 6.4 oz (119.9 kg)  08/02/20 258 lb 14.4 oz (117.4 kg)     CBC    Component Value Date/Time   WBC 13.2 (H) 08/19/2020 0532   RBC 4.02 08/19/2020 0532   HGB 12.3 08/19/2020 0532   HGB 13.7 06/07/2020 1103   HGB 13.5 02/14/2019 0000   HCT 35.4 (L) 08/19/2020 0532   HCT 39.0 06/07/2020 1103   HCT 42 (A) 02/14/2019 0000   PLT 209 08/19/2020 0532   PLT 261 06/07/2020 1103   PLT 270 02/14/2019 0000   MCV 88.1 08/19/2020 0532   MCV 88 06/07/2020 1103   MCH 30.6 08/19/2020 0532   MCHC 34.7 08/19/2020 0532   RDW 13.1 08/19/2020 0532   RDW 12.7 06/07/2020 1103   LYMPHSABS 2.0 01/19/2020 1025   EOSABS 0.1 01/19/2020 1025   BASOSABS 0.0 01/19/2020 1025    Eos 100 12/2019  Chest Imaging: ***  Pulmonary Functions Testing Results: No flowsheet data found.  FeNO: ***  Pathology: ***  Echocardiogram: ***  Heart Catheterization: ***    Assessment & Plan:    Plan:      Omar Person, MD [facility] 05/01/2021 8:01 AM

## 2021-05-02 ENCOUNTER — Institutional Professional Consult (permissible substitution): Payer: Medicaid Other | Admitting: Student

## 2021-05-14 ENCOUNTER — Other Ambulatory Visit: Payer: Self-pay | Admitting: Pulmonary Disease

## 2021-05-14 ENCOUNTER — Ambulatory Visit (INDEPENDENT_AMBULATORY_CARE_PROVIDER_SITE_OTHER): Payer: Medicaid Other | Admitting: Pulmonary Disease

## 2021-05-14 ENCOUNTER — Encounter: Payer: Self-pay | Admitting: Pulmonary Disease

## 2021-05-14 ENCOUNTER — Other Ambulatory Visit: Payer: Self-pay

## 2021-05-14 VITALS — BP 120/78 | HR 100 | Temp 98.2°F | Ht 67.0 in | Wt 230.4 lb

## 2021-05-14 DIAGNOSIS — R0982 Postnasal drip: Secondary | ICD-10-CM

## 2021-05-14 DIAGNOSIS — J4540 Moderate persistent asthma, uncomplicated: Secondary | ICD-10-CM

## 2021-05-14 LAB — CBC WITH DIFFERENTIAL/PLATELET
Basophils Absolute: 0 10*3/uL (ref 0.0–0.1)
Basophils Relative: 0.7 % (ref 0.0–3.0)
Eosinophils Absolute: 0.1 10*3/uL (ref 0.0–0.7)
Eosinophils Relative: 1.8 % (ref 0.0–5.0)
HCT: 41.6 % (ref 36.0–46.0)
Hemoglobin: 13.7 g/dL (ref 12.0–15.0)
Lymphocytes Relative: 40.8 % (ref 12.0–46.0)
Lymphs Abs: 1.6 10*3/uL (ref 0.7–4.0)
MCHC: 32.9 g/dL (ref 30.0–36.0)
MCV: 84.7 fl (ref 78.0–100.0)
Monocytes Absolute: 0.3 10*3/uL (ref 0.1–1.0)
Monocytes Relative: 7.1 % (ref 3.0–12.0)
Neutro Abs: 1.9 10*3/uL (ref 1.4–7.7)
Neutrophils Relative %: 49.6 % (ref 43.0–77.0)
Platelets: 229 10*3/uL (ref 150.0–400.0)
RBC: 4.91 Mil/uL (ref 3.87–5.11)
RDW: 13.3 % (ref 11.5–15.5)
WBC: 3.8 10*3/uL — ABNORMAL LOW (ref 4.0–10.5)

## 2021-05-14 MED ORDER — ALBUTEROL SULFATE HFA 108 (90 BASE) MCG/ACT IN AERS: 2.0000 | INHALATION_SPRAY | Freq: Four times a day (QID) | RESPIRATORY_TRACT | 2 refills | Status: DC | PRN

## 2021-05-14 MED ORDER — FLUTICASONE PROPIONATE 50 MCG/ACT NA SUSP: 1.0000 | Freq: Every day | NASAL | 2 refills | Status: DC

## 2021-05-14 MED ORDER — MONTELUKAST SODIUM 10 MG PO TABS: 10.0000 mg | ORAL_TABLET | Freq: Every day | ORAL | 11 refills | Status: DC

## 2021-05-14 MED ORDER — FLUTICASONE FUROATE-VILANTEROL 100-25 MCG/ACT IN AEPB: 1.0000 | INHALATION_SPRAY | Freq: Every day | RESPIRATORY_TRACT | 5 refills | Status: DC

## 2021-05-14 NOTE — Progress Notes (Signed)
Synopsis: Referred in November 2022 for Asthma by [name]  Subjective:   PATIENT ID: Kathleen [name] GENDER: female DOB: [birth year], MRN: [id number]   HPI  Chief Complaint  Patient presents with   Consult    Patient says she is here to talk about her breathing. Breathing has gotten worse.   Kathleen [name] is a 30 year old woman, never smoker with history of asthma, hypertension and hyperthyroidism who is referred to pulmonary clinic for evaluation of her asthma.   She reports having asthma since childhood.  She has never been on a maintenance inhaler and has only used as needed albuterol.  Over the past 9 months she reports increased chest tightness, cough, sputum production and wheezing since having her second child.  Her asthma triggers include strong perfumes/colognes, cold weather and times of stress.  She currently is experiencing nighttime awakenings due to cough and wheezing.  She also complains of sinus congestion and postnasal drainage.  She does report issues with seasonal allergies more so in the springtime.  She is a never smoker.  She does report history of significant secondhand smoke as a child as her parents smoked around her.  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  She denies any harmful dust or chemical exposures from previous lines of work.  Past Medical History:  Diagnosis Date   Asthma    Hypertension    Hyperthyroidism      Family History  Problem Relation Age of Onset   Cancer Mother    Hypertension Mother      Social History   Socioeconomic History   Marital status: Single    Spouse name: Not on file   Number of children: Not on file   Years of education: Not on file   Highest education level: Not on file  Occupational History   Not on file  Tobacco Use   Smoking status: Former   Smokeless tobacco: Never  Vaping Use   Vaping Use: Never used  Substance and Sexual Activity   Alcohol use: No   Drug  use: Not Currently    Types: Marijuana    Comment: last smoked over a yr ago   Sexual activity: Yes    Birth control/protection: None  Other Topics Concern   Not on file  Social History Narrative   Not on file   Social Determinants of Health   Financial Resource Strain: Not on file  Food Insecurity: No Food Insecurity   Worried About Running Out of Food in the Last Year: Never true   Ran Out of Food in the Last Year: Never true  Transportation Needs: No Transportation Needs   Lack of Transportation (Medical): No   Lack of Transportation (Non-Medical): No  Physical Activity: Not on file  Stress: Not on file  Social Connections: Not on file  Intimate Partner Violence: Not on file     No Known Allergies   Outpatient Medications Prior to Visit  Medication Sig Dispense Refill   acetaminophen (TYLENOL) 325 MG tablet Take 2 tablets (650 mg total) by mouth every 6 (six) hours as needed for mild pain, moderate pain, fever or headache.     amLODipine (NORVASC) 5 MG tablet TAKE 1 TABLET (5 MG TOTAL) BY MOUTH DAILY. 90 tablet 0   amLODipine (NORVASC) 5 MG tablet TAKE 1 TABLET (5 MG TOTAL) BY MOUTH DAILY. 45 tablet 1   coconut oil OIL Apply 1 application topically as needed (nipple pain).  0  ibuprofen (ADVIL) 600 MG tablet Take 1 tablet (600 mg total) by mouth every 8 (eight) hours as needed for moderate pain or cramping. 30 tablet 0   ibuprofen (ADVIL) 600 MG tablet TAKE 1 TABLET (600 MG TOTAL) BY MOUTH EVERY 8 (EIGHT) HOURS AS NEEDED FOR MODERATE PAIN OR CRAMPING. 30 tablet 0   norethindrone (MICRONOR) 0.35 MG tablet TAKE 1 TABLET (0.35 MG TOTAL) BY MOUTH DAILY. 56 tablet 6   norethindrone (ORTHO MICRONOR) 0.35 MG tablet Take 1 tablet (0.35 mg total) by mouth daily. 56 tablet 6   prenatal vitamin w/FE, FA (PRENATAL 1 + 1) 27-1 MG TABS tablet Take 1 tablet by mouth daily at 12 noon. 30 tablet 11   albuterol (VENTOLIN HFA) 108 (90 Base) MCG/ACT inhaler Inhale 2 puffs into the lungs every 6  (six) hours as needed for wheezing or shortness of breath. 8 g 2   PROAIR HFA 108 (90 Base) MCG/ACT inhaler INHALE 1-2 PUFFS INTO THE LUNGS EVERY 6 (SIX) HOURS AS NEEDED FOR WHEEZING AND SHORTNESS OF BREATH 8.5 each 0   fluticasone (FLOVENT HFA) 110 MCG/ACT inhaler Inhale 1 puff into the lungs daily. (Patient not taking: Reported on 05/14/2021) 1 each 12   No facility-administered medications prior to visit.    Review of Systems  Constitutional:  Negative for chills, fever, malaise/fatigue and weight loss.  HENT:  Positive for congestion. Negative for sinus pain and sore throat.   Eyes: Negative.   Respiratory:  Positive for cough, sputum production, shortness of breath and wheezing. Negative for hemoptysis.   Cardiovascular:  Negative for chest pain, palpitations, orthopnea, claudication and leg swelling.  Gastrointestinal:  Negative for abdominal pain, heartburn, nausea and vomiting.  Genitourinary: Negative.   Musculoskeletal:  Negative for joint pain and myalgias.  Skin:  Negative for rash.  Neurological:  Negative for weakness.  Endo/Heme/Allergies:  Positive for environmental allergies.  Psychiatric/Behavioral: Negative.       Objective:   Vitals:   05/14/21 0912  BP: 120/78  Pulse: 100  Temp: 98.2 F (36.8 C)  TempSrc: Oral  SpO2: 98%  Weight: 230 lb 6.4 oz (104.5 kg)  Height: 5\' 7"  (1.702 m)    Physical Exam Constitutional:      General: She is not in acute distress.    Appearance: She is obese. She is not ill-appearing.  HENT:     Head: Normocephalic and atraumatic.     Mouth/Throat:     Mouth: Mucous membranes are moist.     Pharynx: Oropharynx is clear.  Eyes:     General: No scleral icterus.    Conjunctiva/sclera: Conjunctivae normal.     Pupils: Pupils are equal, round, and reactive to light.  Cardiovascular:     Rate and Rhythm: Normal rate and regular rhythm.     Pulses: Normal pulses.     Heart sounds: Normal heart sounds. No murmur  heard. Pulmonary:     Effort: Pulmonary effort is normal.     Breath sounds: Normal breath sounds. No wheezing, rhonchi or rales.  Abdominal:     General: Bowel sounds are normal.     Palpations: Abdomen is soft.  Musculoskeletal:     Right lower leg: No edema.     Left lower leg: No edema.  Lymphadenopathy:     Cervical: No cervical adenopathy.  Skin:    General: Skin is warm and dry.  Neurological:     General: No focal deficit present.     Mental Status: She is alert.  Psychiatric:        Mood and Affect: Mood normal.        Behavior: Behavior normal.        Thought Content: Thought content normal.        Judgment: Judgment normal.   CBC    Component Value Date/Time   WBC 3.8 (L) 05/14/2021 0943   RBC 4.91 05/14/2021 0943   HGB 13.7 05/14/2021 0943   HGB 13.7 06/07/2020 1103   HGB 13.5 02/14/2019 0000   HCT 41.6 05/14/2021 0943   HCT 39.0 06/07/2020 1103   HCT 42 (A) 02/14/2019 0000   PLT 229.0 05/14/2021 0943   PLT 261 06/07/2020 1103   PLT 270 02/14/2019 0000   MCV 84.7 05/14/2021 0943   MCV 88 06/07/2020 1103   MCH 30.6 08/19/2020 0532   MCHC 32.9 05/14/2021 0943   RDW 13.3 05/14/2021 0943   RDW 12.7 06/07/2020 1103   LYMPHSABS 1.6 05/14/2021 0943   LYMPHSABS 2.0 01/19/2020 1025   MONOABS 0.3 05/14/2021 0943   EOSABS 0.1 05/14/2021 0943   EOSABS 0.1 01/19/2020 1025   BASOSABS 0.0 05/14/2021 0943   BASOSABS 0.0 01/19/2020 1025   BMP Latest Ref Rng & Units 08/18/2020 01/19/2020 09/11/2019  Glucose 70 - 99 mg/dL 94 72 91  BUN 6 - 20 mg/dL 6 6 6   Creatinine 0.44 - 1.00 mg/dL 4.26 8.34  BUN/Creat Ratio 9 - 23 - 8(L) -  Sodium 135 - 145 mmol/L 133(L) 132(L) 136  Potassium 3.5 - 5.1 mmol/L 5.1 4.2 3.9  Chloride 98 - 111 mmol/L 99 98 106  CO2 22 - 32 mmol/L 20(L) 23 22  Calcium 8.9 - 10.3 mg/dL 9.4 9.8 9.3   Chest imaging:  PFT: No flowsheet data found.  Labs:  Path:  Echo:  Heart Catheterization:  Assessment & Plan:   Moderate persistent  asthma without complication - Plan: fluticasone furoate-vilanterol (BREO ELLIPTA) 100-25 MCG/ACT AEPB, montelukast (SINGULAIR) 10 MG tablet, albuterol (VENTOLIN HFA) 108 (90 Base) MCG/ACT inhaler, CBC with Differential, IgE, IgE, CBC with Differential  Post-nasal drip - Plan: fluticasone (FLONASE) 50 MCG/ACT nasal spray  Discussion: Kathleen [name] is a 30 year old woman, never smoker with history of asthma, hypertension and hyperthyroidism who is referred to pulmonary clinic for evaluation of her asthma.   She has moderate persistent asthma based on her clinical history over recent months.  She is to start Breo Ellipta 1 puff daily and continue as needed albuterol.  We will also start her on montelukast 10 mg daily for her issues with allergies.  We will check a CBC with differential and IgE level today.  For postnasal drip she is to start fluticasone 2 sprays per nostril daily.  We can consider adding ipratropium nasal spray in the future if needed.  Follow-up in 4 weeks for video visit.  26, MD [facility] Office: [phone]   Current Outpatient Medications:    acetaminophen (TYLENOL) 325 MG tablet, Take 2 tablets (650 mg total) by mouth every 6 (six) hours as needed for mild pain, moderate pain, fever or headache., Disp: , Rfl:    amLODipine (NORVASC) 5 MG tablet, TAKE 1 TABLET (5 MG TOTAL) BY MOUTH DAILY., Disp: 90 tablet, Rfl: 0   amLODipine (NORVASC) 5 MG tablet, TAKE 1 TABLET (5 MG TOTAL) BY MOUTH DAILY., Disp: 45 tablet, Rfl: 1   coconut oil OIL, Apply 1 application topically as needed (nipple pain)., Disp: , Rfl: 0   fluticasone (FLONASE)  50 MCG/ACT nasal spray, Place 1 spray into both nostrils daily., Disp: 16 g, Rfl: 2   fluticasone furoate-vilanterol (BREO ELLIPTA) 100-25 MCG/ACT AEPB, Inhale 1 puff into the lungs daily., Disp: 30 each, Rfl: 5   ibuprofen (ADVIL) 600 MG tablet, Take 1 tablet (600 mg total) by mouth every 8 (eight) hours as  needed for moderate pain or cramping., Disp: 30 tablet, Rfl: 0   ibuprofen (ADVIL) 600 MG tablet, TAKE 1 TABLET (600 MG TOTAL) BY MOUTH EVERY 8 (EIGHT) HOURS AS NEEDED FOR MODERATE PAIN OR CRAMPING., Disp: 30 tablet, Rfl: 0   montelukast (SINGULAIR) 10 MG tablet, Take 1 tablet (10 mg total) by mouth at bedtime., Disp: 30 tablet, Rfl: 11   norethindrone (MICRONOR) 0.35 MG tablet, TAKE 1 TABLET (0.35 MG TOTAL) BY MOUTH DAILY., Disp: 56 tablet, Rfl: 6   norethindrone (ORTHO MICRONOR) 0.35 MG tablet, Take 1 tablet (0.35 mg total) by mouth daily., Disp: 56 tablet, Rfl: 6   prenatal vitamin w/FE, FA (PRENATAL 1 + 1) 27-1 MG TABS tablet, Take 1 tablet by mouth daily at 12 noon., Disp: 30 tablet, Rfl: 11   albuterol (VENTOLIN HFA) 108 (90 Base) MCG/ACT inhaler, Inhale 2 puffs into the lungs every 6 (six) hours as needed for wheezing or shortness of breath., Disp: 8 g, Rfl: 2

## 2021-05-14 NOTE — Patient Instructions (Addendum)
Start Breo Ellipta 1 puff daily - rinse mouth out after each use  Continue to use albuterol inhaler as needed  Start montelukast (singulair) 10mg  daily for allergies related to your asthma  Start fluticasone nasal spray, 1 spray per nostril daily

## 2021-05-15 ENCOUNTER — Encounter: Payer: Self-pay | Admitting: Pulmonary Disease

## 2021-05-15 ENCOUNTER — Other Ambulatory Visit (HOSPITAL_COMMUNITY): Payer: Self-pay

## 2021-05-15 ENCOUNTER — Telehealth: Payer: Self-pay | Admitting: Pulmonary Disease

## 2021-05-15 LAB — IGE: IgE (Immunoglobulin E), Serum: 373 kU/L — ABNORMAL HIGH (ref <=114)

## 2021-05-15 NOTE — Telephone Encounter (Signed)
Please advise that patient needs prior [facility] for her medications

## 2021-05-15 NOTE — Telephone Encounter (Signed)
Attempted to call pt but unable to reach. Left message for her to return call. 

## 2021-05-15 NOTE — Telephone Encounter (Signed)
PA needed for Albuterol and Breo only. Montelukast and Flonase were both filled 11.22.22. Will submit PA for the other two.

## 2021-05-17 ENCOUNTER — Other Ambulatory Visit (HOSPITAL_COMMUNITY): Payer: Self-pay

## 2021-05-17 ENCOUNTER — Telehealth: Payer: Self-pay | Admitting: Pharmacy Technician

## 2021-05-17 NOTE — Telephone Encounter (Signed)
No PA needed for Ventolin HFA, pt[facility] fills at [facility].

## 2021-05-17 NOTE — Telephone Encounter (Signed)
Patient Advocate Encounter  Received notification from [employer] that prior authorization for ALBUTEROL INH is required.   PA submitted on 11.25.22 Key [id number] Status is pending   [facility] will continue to follow  Ricke Hey, CPhT Patient Advocate Phone: [phone] Fax:  [phone]

## 2021-05-17 NOTE — Telephone Encounter (Signed)
Patient Advocate Encounter  Received notification from [employer] that prior authorization for BREO 100 is required.   PA submitted on 11.25.22 Key  [id number] Status is pending   [facility] will continue to follow  Ricke Hey, CPhT Patient Advocate Phone: [phone] Fax:  [phone]

## 2021-05-17 NOTE — Telephone Encounter (Signed)
Patient Advocate Encounter  Received notification from ins. that the request for prior authorization for Kathleen [name] has been denied due to the pt not trying & failing Advair, Dulera or Symbicort.      [facility] Patient Advocate Fax:  [phone]

## 2021-05-20 NOTE — Telephone Encounter (Signed)
No PA needed for Albuterol Sulfate HFA , next fill is 06/02/21 at [facility].

## 2021-05-23 DIAGNOSIS — Z419 Encounter for procedure for purposes other than remedying health state, unspecified: Secondary | ICD-10-CM | POA: Diagnosis not present

## 2021-05-31 MED ORDER — FLUTICASONE-SALMETEROL 250-50 MCG/ACT IN AEPB: 1.0000 | INHALATION_SPRAY | Freq: Two times a day (BID) | RESPIRATORY_TRACT | 6 refills | Status: DC

## 2021-05-31 NOTE — Telephone Encounter (Signed)
Called and spoke with patient. She is aware of the medication change and verbalized understanding. Will go ahead and send in the [facility] [address].   Nothing further needed at time of call.

## 2021-05-31 NOTE — Addendum Note (Signed)
Addended by: Maurene Capes on: 05/31/2021 05:39 PM   Modules accepted: Orders

## 2021-05-31 NOTE — Telephone Encounter (Signed)
° ° °  Dr. Dewald please advise  °

## 2021-06-11 ENCOUNTER — Telehealth (INDEPENDENT_AMBULATORY_CARE_PROVIDER_SITE_OTHER): Payer: Medicaid Other | Admitting: Pulmonary Disease

## 2021-06-11 ENCOUNTER — Encounter: Payer: Self-pay | Admitting: Pulmonary Disease

## 2021-06-11 ENCOUNTER — Other Ambulatory Visit: Payer: Self-pay

## 2021-06-11 DIAGNOSIS — J4540 Moderate persistent asthma, uncomplicated: Secondary | ICD-10-CM | POA: Diagnosis not present

## 2021-06-11 DIAGNOSIS — R0982 Postnasal drip: Secondary | ICD-10-CM | POA: Diagnosis not present

## 2021-06-11 NOTE — Patient Instructions (Addendum)
Continue advair 250-63mcg 1 puff twice daily - rinse mouth after each use  Use albuterol inhaler 1-2 puffs as needed  Use fluticasone nasal spray 2 sprays per nostril once daily  Let us know in 1 month if you continue to have sinus congestion and drainage and we will discuss sinus rinses.   Follow up in 3 months for pulmonary function tests

## 2021-06-11 NOTE — Progress Notes (Signed)
Virtual Visit via Video Note  I connected with Kathleen [name] on 06/11/21 at  3:45 PM EST by a video enabled telemedicine application and verified that I am speaking with the correct person using two identifiers.  Location: Patient: Home Provider: Clinic   I discussed the limitations of evaluation and management by telemedicine and the availability of in person appointments. The patient expressed understanding and agreed to proceed.  History of Present Illness: Kathleen [name] is a 30 year old woman, never smoker with history of asthma, hypertension and hyperthyroidism who returns to pulmonary clinic for evaluation of her asthma.    She was started on Advair discus 250-50 MCG 1 puff twice daily after last visit and reports significant improvement in her cough, wheezing and shortness of breath.  She has not required frequent albuterol use since starting Advair inhaler.  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  She continues to have issues with sinus congestion and drainage despite using fluticasone nasal spray 1 spray per nostril daily.  Observations/Objective: Patient appears well and in good spirits. No respiratory distress noted. No coughing during visit.  Assessment and Plan: Kathleen [name] is a 30 year old woman, never smoker with history of asthma, hypertension and hyperthyroidism who returns to pulmonary clinic for evaluation of her asthma.   She has moderate persistent asthma that is better controlled with advair 250-88mcg 1 puff twice daily. She can continue this with as needed albuterol. She is to continue montelukast 10mg  daily.  We will increase her fluticasone nasal spray to 2 sprays per nostril twice daily. If she continues to have significant sinus congestion and drainage we will consider nasal rinses next.  Follow Up Instructions: Follow up in 3 months with pulmonary function tests.   I discussed the assessment and  treatment plan with the patient. The patient was provided an opportunity to ask questions and all were answered. The patient agreed with the plan and demonstrated an understanding of the instructions.   The patient was advised to call back or seek an in-person evaluation if the symptoms worsen or if the condition fails to improve as anticipated.  I provided 30 minutes of non-face-to-face time during this encounter.   , MD

## 2021-06-23 DIAGNOSIS — Z419 Encounter for procedure for purposes other than remedying health state, unspecified: Secondary | ICD-10-CM | POA: Diagnosis not present

## 2021-06-23 NOTE — L&D Delivery Note (Signed)
OB/[facility]N Faculty Practice Delivery Note  Kathleen [name] is a 31 y.o. [id number] s/p SVD at [redacted]w[redacted]d. She was admitted for IOL for cHTN.   ROM: 0h 66m with clear fluid GBS Status:  NEGATIVE/-- (10/02 0729) Maximum Maternal Temperature:  Temp (24hrs), Avg:98.4 F (36.9 C), Min:98 F (36.7 C), Max:98.8 F (37.1 C)    Labor Progress: Patient arrived at 0.5 cm dilation and was induced with cytotec, FB, pitocin, AROM.   Delivery Date/Time: 03/24/2022 at 1502 Delivery: Called to room and patient was complete and pushing. Head delivered in LOA position. Tight nuchal cord and body cord delivered through and reduced immediately . Shoulder and body delivered in usual fashion. Infant with spontaneous cry, placed on mother's abdomen, dried and stimulated. Cord clamped x 2 after 1-minute delay, and cut by maternal friend. Cord blood drawn. Placenta delivered spontaneously with gentle cord traction. Fundus firm with massage and Pitocin. Labia, perineum, vagina, and cervix inspected with bilateral right and left superior labial tears repaired with 4-0 monocryl.   Placenta: Spontaneous, intact, 3 vessel cord Complications: None  Lacerations: Bilateral labial  EBL: 236 Analgesia: Lidocaine    Infant: APGAR (1 MIN): 8   APGAR (5 MINS): 9   APGAR (10 MINS):    Weight: Pending  Gifford Shave, MD  OB Fellow 03/24/2022 3:43 PM

## 2021-06-26 DIAGNOSIS — R748 Abnormal levels of other serum enzymes: Secondary | ICD-10-CM | POA: Diagnosis not present

## 2021-06-26 DIAGNOSIS — I10 Essential (primary) hypertension: Secondary | ICD-10-CM | POA: Diagnosis not present

## 2021-06-26 DIAGNOSIS — E785 Hyperlipidemia, unspecified: Secondary | ICD-10-CM | POA: Diagnosis not present

## 2021-06-26 DIAGNOSIS — Z13220 Encounter for screening for lipoid disorders: Secondary | ICD-10-CM | POA: Diagnosis not present

## 2021-06-26 DIAGNOSIS — J45909 Unspecified asthma, uncomplicated: Secondary | ICD-10-CM | POA: Diagnosis not present

## 2021-06-26 DIAGNOSIS — Z130 Encounter for screening for diseases of the blood and blood-forming organs and certain disorders involving the immune mechanism: Secondary | ICD-10-CM | POA: Diagnosis not present

## 2021-07-24 DIAGNOSIS — Z419 Encounter for procedure for purposes other than remedying health state, unspecified: Secondary | ICD-10-CM | POA: Diagnosis not present

## 2021-07-30 ENCOUNTER — Encounter: Payer: Self-pay | Admitting: General Practice

## 2021-07-30 ENCOUNTER — Other Ambulatory Visit: Payer: Self-pay

## 2021-07-30 ENCOUNTER — Ambulatory Visit (INDEPENDENT_AMBULATORY_CARE_PROVIDER_SITE_OTHER): Payer: Medicaid Other | Admitting: General Practice

## 2021-07-30 DIAGNOSIS — O219 Vomiting of pregnancy, unspecified: Secondary | ICD-10-CM

## 2021-07-30 DIAGNOSIS — Z3201 Encounter for pregnancy test, result positive: Secondary | ICD-10-CM

## 2021-07-30 MED ORDER — PROMETHAZINE HCL 25 MG PO TABS: 25.0000 mg | ORAL_TABLET | Freq: Four times a day (QID) | ORAL | 0 refills | Status: DC | PRN

## 2021-07-30 NOTE — Progress Notes (Signed)
Patient came by office and dropped off urine sample for UPT. UPT +.   Called patient at home and she reports first positive home test earlier today. LMP 06/28/21 EDD 04/04/22 [redacted]w[redacted]d. Allergies/meds reviewed & approved. Patient will return to office for new OB intake & new OB appt.   Chase Caller RN BSN 07/30/21

## 2021-07-30 NOTE — Progress Notes (Signed)
Chart reviewed for nurse visit. Agree with plan of care.   Venora Maples, MD 07/30/21 2:02 PM

## 2021-08-17 MED FILL — Amlodipine Besylate Tab 5 MG (Base Equivalent): ORAL | 45 days supply | Qty: 45 | Fill #0 | Status: CN

## 2021-08-19 ENCOUNTER — Other Ambulatory Visit (HOSPITAL_COMMUNITY): Payer: Self-pay

## 2021-08-19 IMAGING — US US OB LIMITED
1 series · 3 of 3 positions shown · non-contrast
Comparison: none

[Series 1: us ob limited · 3 acquisitions, 3 frames shown]
[im 1/3]
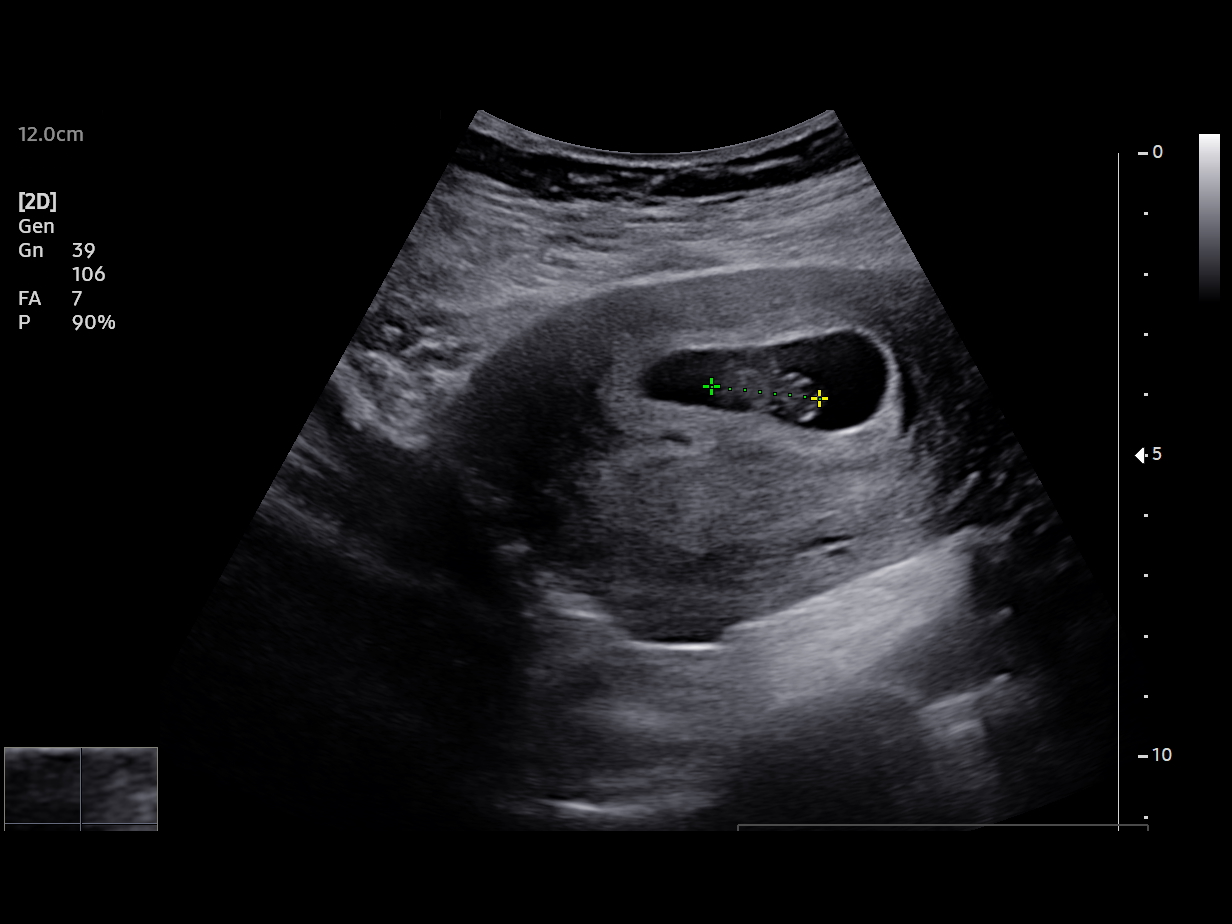
[im 2/3]
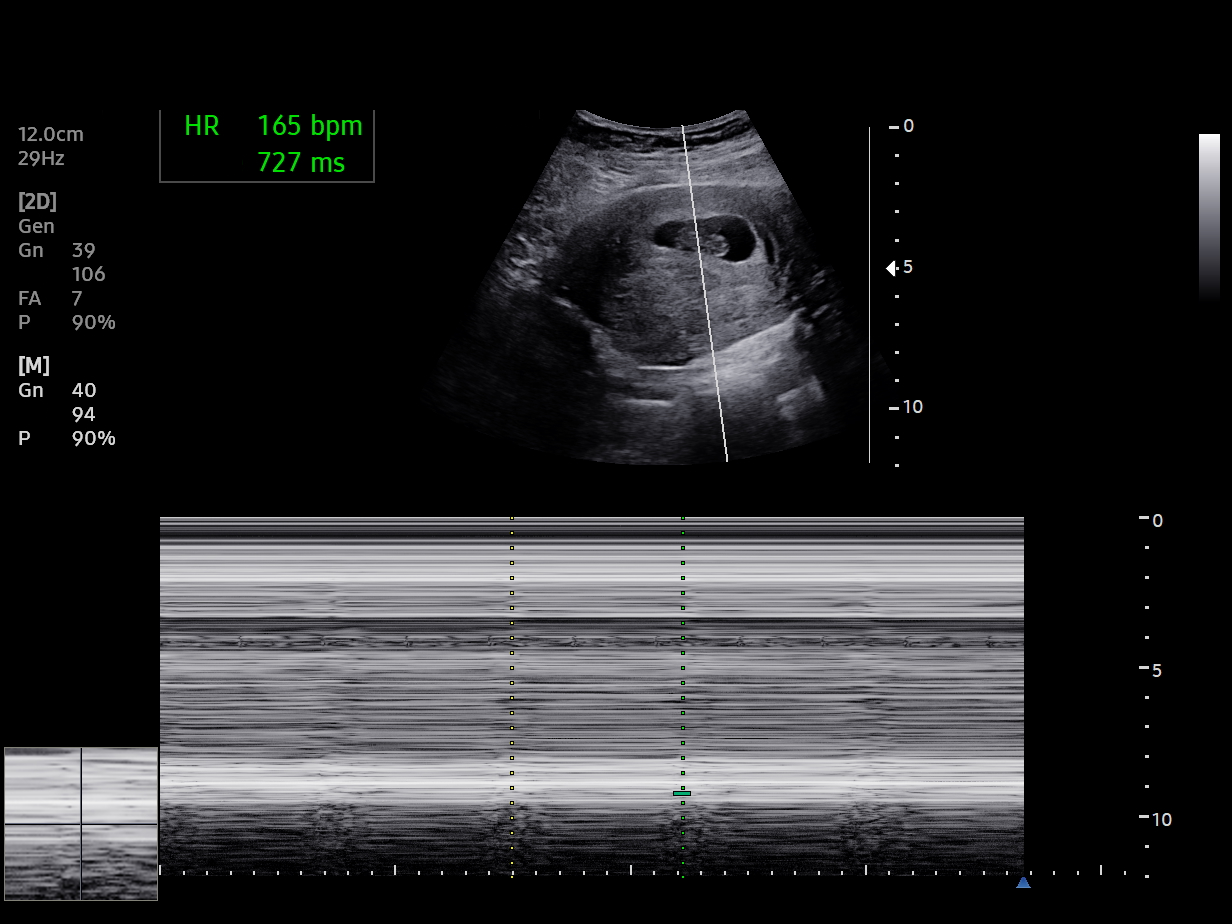
[im 3/3]
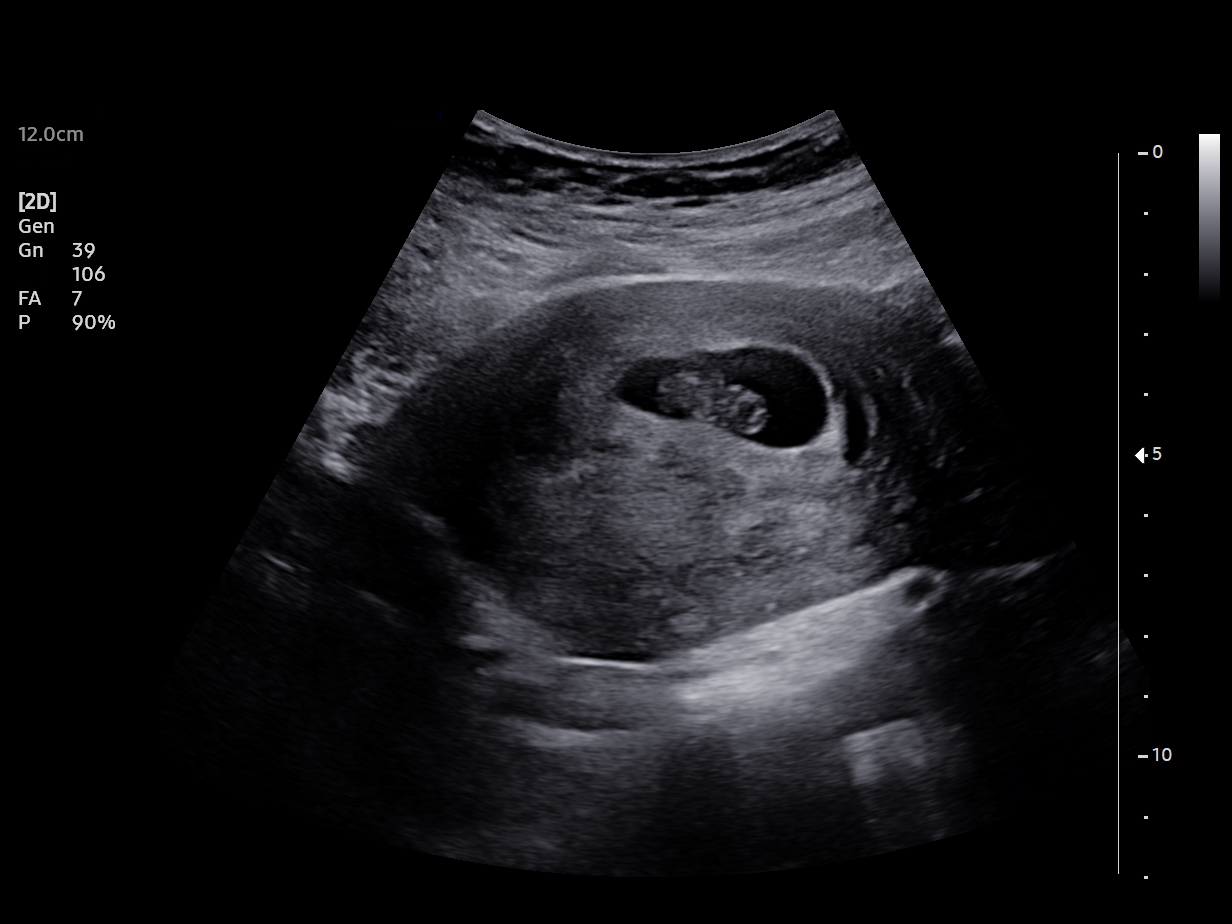

[3 of 3 positions shown; findings below may reference images not displayed]

[REDACTED]care at

 1  [HOSPITAL]                         768[address].0     DOCET DUREL

Service(s) Provided

 OB Limited                                            [id number]
Indications

 Weeks of gestation of pregnancy not
 specified
 Pregnancy with inconclusive fetal viability
Fetal Evaluation

 Num Of Fetuses:         1
 Preg. Location:         Intrauterine
 Gest. Sac:              Intrauterine
 Fetal Pole:             Visualized
 Fetal Heart Rate(bpm):  165
 Cardiac Activity:       Observed
Biometry

 CRL:     18.01  mm     G. Age:  8w 1d                   EDD:   08/29/20
Impression

 Single living IUP, + flicker, measures 8 wk 2 days
Recommendations

 Follow-up as clinically indicated.
                  [place], [place]

## 2021-08-21 ENCOUNTER — Ambulatory Visit: Payer: Medicaid Other | Admitting: Pulmonary Disease

## 2021-08-21 DIAGNOSIS — Z419 Encounter for procedure for purposes other than remedying health state, unspecified: Secondary | ICD-10-CM | POA: Diagnosis not present

## 2021-08-29 ENCOUNTER — Telehealth (INDEPENDENT_AMBULATORY_CARE_PROVIDER_SITE_OTHER): Payer: Medicaid Other

## 2021-08-29 DIAGNOSIS — O0990 Supervision of high risk pregnancy, unspecified, unspecified trimester: Secondary | ICD-10-CM | POA: Insufficient documentation

## 2021-08-29 DIAGNOSIS — Z3A00 Weeks of gestation of pregnancy not specified: Secondary | ICD-10-CM

## 2021-08-29 DIAGNOSIS — O169 Unspecified maternal hypertension, unspecified trimester: Secondary | ICD-10-CM

## 2021-08-29 NOTE — Patient Instructions (Signed)

## 2021-08-29 NOTE — Progress Notes (Signed)
New OB Intake ? ?I connected with  Kathleen [name] on 08/29/21 at  3:15 PM EST by MyChart Video Visit and verified that I am speaking with the correct person using two identifiers. Nurse is located at [facility] and pt is located at [employer]. ? ?I discussed the limitations, risks, security and privacy concerns of performing an evaluation and management service by telephone and the availability of in person appointments. I also discussed with the patient that there may be a patient responsible charge related to this service. The patient expressed understanding and agreed to proceed. ? ?I explained I am completing New OB Intake today. We discussed her EDD of 04/04/22 that is based on LMP of 06/28/21. Pt is G5/P2. I reviewed her allergies, medications, Medical/Surgical/OB history, and appropriate screenings. I informed her of [facility] services. Based on history, this is a/an  pregnancy complicated by hypertension .  ? ?Patient Active Problem List  ? Diagnosis Date Noted  ? Hyperthyroidism 09/28/2020  ? Asthma 09/28/2020  ? Hypertension 09/28/2020  ? Vaginal delivery 08/18/2020  ? Hyperthyroidism affecting pregnancy 04/04/2019  ? Sickle cell trait (HCC) 03/21/2019  ? Supervision of high risk pregnancy, antepartum 03/07/2019  ? Chronic hypertension affecting pregnancy 03/07/2019  ? Asthma affecting pregnancy in first trimester 03/07/2019  ? ? ?Concerns addressed today ? ?Delivery Plans:  ?Plans to deliver at [facility].  ? ?Waterbirth candidate?  ? ?MyChart/Babyscripts ?MyChart access verified. I explained pt will have some visits in office and some virtually. Babyscripts instructions given and order placed. Patient verifies receipt of registration text/e-mail. Account successfully created and app downloaded. ? ?Blood Pressure Cuff  ?Blood pressure cuff ordered for patient to pick-up from [employer]. Explained after first prenatal appt pt will check weekly and document in Babyscripts. ? ?Weight scale: Patient does / does not   have weight scale. Weight scale ordered for patient to pick up from [employer].  ? ?Anatomy US ?Explained first scheduled [place] will be around 19 weeks. Anatomy US scheduled for 11/08/21 at 10:45. Pt notified to arrive at 10:30. ?Scheduled AFP lab only appointment if CenteringPregnancy pt for same day as anatomy US.  ? ?Labs ?Discussed Avelina Laine genetic screening with patient. Would like both Panorama and Horizon drawn at new OB visit.Also if interested in genetic testing, tell patient she will need AFP 15-21 weeks to complete genetic testing .Routine prenatal labs needed. ? ?Covid Vaccine ?Patient has not covid vaccine.  ? ?Is patient a CenteringPregnancy candidate? Not a candidate  ? ?Is patient a Mom+Baby Combined Care candidate? Not a candidate    ? ?Informed patient of [facility] website  and placed link in her AVS.  ? ?Social Determinants of Health ?Food Insecurity: Patient denies food insecurity. ?WIC Referral: Patient is interested in referral to [facility].  ?Transportation: Patient denies transportation needs. ?Childcare: Discussed no children allowed at ultrasound appointments. Offered childcare services; patient declines childcare services at this time. ? ?Send link to Pregnancy Navigators ? ? ?Placed OB Box on problem list and updated ? ?First visit review ?I reviewed new OB appt with pt. I explained she will have a pelvic exam, ob bloodwork with genetic screening, and PAP smear. Explained pt will be seen by Dr. Vergie Living at first visit; encounter routed to appropriate provider. Explained that patient will be seen by pregnancy navigator following visit with provider. [facility] information placed in AVS.  ? ?Henrietta Dine, CMA ?08/29/2021  3:25 PM  ?

## 2021-09-04 ENCOUNTER — Other Ambulatory Visit: Payer: Self-pay | Admitting: Obstetrics and Gynecology

## 2021-09-04 ENCOUNTER — Telehealth: Payer: Self-pay
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
## 2021-09-06 ENCOUNTER — Encounter: Payer: Self-pay | Admitting: *Deleted

## 2021-09-06 ENCOUNTER — Other Ambulatory Visit: Payer: Self-pay | Admitting: *Deleted

## 2021-09-06 DIAGNOSIS — J4540 Moderate persistent asthma, uncomplicated: Secondary | ICD-10-CM

## 2021-09-06 MED ORDER — ALBUTEROL SULFATE HFA 108 (90 BASE) MCG/ACT IN AERS: 2.0000 | INHALATION_SPRAY | Freq: Four times a day (QID) | RESPIRATORY_TRACT | 2 refills | Status: DC | PRN

## 2021-09-06 NOTE — Progress Notes (Signed)
Pt requested refill of albuterol. Currently [redacted]w[redacted]d and has New Ob appt scheduled on 3/27. Ob intake completed 08/29/21. ?

## 2021-09-12 ENCOUNTER — Encounter: Payer: Medicaid Other | Admitting: Student

## 2021-09-16 ENCOUNTER — Other Ambulatory Visit: Payer: Self-pay

## 2021-09-16 ENCOUNTER — Encounter: Payer: Self-pay | Admitting: Obstetrics and Gynecology

## 2021-09-16 ENCOUNTER — Ambulatory Visit (INDEPENDENT_AMBULATORY_CARE_PROVIDER_SITE_OTHER): Payer: Medicaid Other | Admitting: Obstetrics and Gynecology

## 2021-09-16 VITALS — BP 123/75 | HR 97 | Wt 230.1 lb

## 2021-09-16 DIAGNOSIS — J45909 Unspecified asthma, uncomplicated: Secondary | ICD-10-CM

## 2021-09-16 DIAGNOSIS — O99511 Diseases of the respiratory system complicating pregnancy, first trimester: Secondary | ICD-10-CM

## 2021-09-16 DIAGNOSIS — O0990 Supervision of high risk pregnancy, unspecified, unspecified trimester: Secondary | ICD-10-CM | POA: Diagnosis not present

## 2021-09-16 DIAGNOSIS — O99281 Endocrine, nutritional and metabolic diseases complicating pregnancy, first trimester: Secondary | ICD-10-CM

## 2021-09-16 DIAGNOSIS — Z3A11 11 weeks gestation of pregnancy: Secondary | ICD-10-CM

## 2021-09-16 DIAGNOSIS — E0590 Thyrotoxicosis, unspecified without thyrotoxic crisis or storm: Secondary | ICD-10-CM

## 2021-09-16 DIAGNOSIS — O10919 Unspecified pre-existing hypertension complicating pregnancy, unspecified trimester: Secondary | ICD-10-CM

## 2021-09-16 DIAGNOSIS — Z3481 Encounter for supervision of other normal pregnancy, first trimester: Secondary | ICD-10-CM | POA: Diagnosis not present

## 2021-09-16 DIAGNOSIS — D573 Sickle-cell trait: Secondary | ICD-10-CM

## 2021-09-16 NOTE — Progress Notes (Signed)
New OB Note  ?09/16/2021  ? ?Clinic: Center for [facility] ? ?Chief Complaint: new OB ? ?Transfer of Care Patient: no ? ?History of Present Illness: Kathleen [name] is a 31 y.o. [id number] @ 11/3 weeks (EDC 10/13, based on Patient's last menstrual period was 06/28/2021.).  Preg complicated by has Chronic hypertension affecting pregnancy; Asthma affecting pregnancy in first trimester; Sickle cell trait (HCC); Hyperthyroidism affecting pregnancy; Hyperthyroidism; and Supervision of high risk pregnancy, antepartum on their problem list.  ? ?Any events prior to today's visit: no ?She has Negative signs or symptoms of nausea/vomiting of pregnancy. ?She has Negative signs or symptoms of miscarriage or preterm labor ? ?ROS: A 12-point review of systems was performed and negative, except as stated in the above HPI. ? ?OBGYN History: As per HPI. ?OB History  ?Gravida Para Term Preterm AB Living  ?5 2 2  0 2 2  ?SAB IAB Ectopic Multiple Live Births  ?2 0 0 0 2  ?  ?# Outcome Date GA Lbr Len/2nd Weight Sex Delivery Anes PTL Lv  ?5 Current           ?4 Term 08/18/20 [redacted]w[redacted]d 00:46 / 00:19 7 lb 14.8 oz (3.595 kg) F Vag-Spont Local  LIV  ?   Birth Comments: none  ?3 Term 09/12/19 [redacted]w[redacted]d / 00:39 8 lb 2.3 oz (3.695 kg) F Vag-Spont EPI  LIV  ?2 SAB 2016          ?   Birth Comments: System Generated. Please review and update pregnancy details.  ?1 SAB 2014          ? ?  ?Past Medical History: ?Past Medical History:  ?Diagnosis Date  ? Asthma   ? Hypertension   ? Hyperthyroidism   ? ? ?Past Surgical History: ?Past Surgical History:  ?Procedure Laterality Date  ? DILATION AND EVACUATION N/A 12/16/2012  ? Procedure: DILATATION AND EVACUATION;  Surgeon: 12/18/2012, MD;  Location: [facility];  Service: Gynecology;  Laterality: N/A;  ? WISDOM TOOTH EXTRACTION  2009  ? ? ?Family History:  ?Family History  ?Problem Relation Age of Onset  ? Cancer Mother   ? Hypertension Mother   ? ?Social History:  ?Social History   ? ?Socioeconomic History  ? Marital status: Single  ?  Spouse name: Not on file  ? Number of children: Not on file  ? Years of education: Not on file  ? Highest education level: Not on file  ?Occupational History  ? Not on file  ?Tobacco Use  ? Smoking status: Former  ? Smokeless tobacco: Never  ?Vaping Use  ? Vaping Use: Never used  ?Substance and Sexual Activity  ? Alcohol use: No  ? Drug use: Not Currently  ?  Types: Marijuana  ?  Comment: last smoked over a yr ago  ? Sexual activity: Yes  ?  Birth control/protection: None  ?Other Topics Concern  ? Not on file  ?Social History Narrative  ? Not on file  ? ?Social Determinants of Health  ? ?Financial Resource Strain: Not on file  ?Food Insecurity: Not on file  ?Transportation Needs: Not on file  ?Physical Activity: Not on file  ?Stress: Not on file  ?Social Connections: Not on file  ?Intimate Partner Violence: Not on file  ? ? ?Allergy: ?No Known Allergies ? ?Current Outpatient Medications: ?Prenatal vitamin ?Norvasc 5mg  po qday ?Singulair ?Albuterol ? ?Physical Exam: ?  ?BP 123/75   Pulse 97   Wt 230 lb 1.6 oz (  104.4 kg)   LMP 06/28/2021   BMI 36.04 kg/m?  ?Body mass index is 36.04 kg/m?Marland Kitchen ?Contractions: Not present ?Vag. Bleeding: None. ?Fundal height: not applicable ?FHTs: 170s ? ?General appearance: Well nourished, well developed female in no acute distress.  ?Neck:  Supple, normal appearance, and no thyromegaly  ?Cardiovascular: S1, S2 normal, no murmur, rub or gallop, regular rate and rhythm ?Respiratory:  Clear to auscultation bilateral. Normal respiratory effort ?Abdomen: positive bowel sounds and no masses, hernias; diffusely non tender to palpation, non distended ?Breasts: patient denies any s/s.  ?Neuro/Psych:  Normal mood and affect.  ?Skin:  Warm and dry.  ? ?Pelvic exam: patient declines ? ?Laboratory: ?none ? ?Imaging:  ?Bedside u/s shows SLIUP c/w 11-12wks, normal FHR, +fetal movement, subjectively normal fluid ? ?Assessment: pt  stable ? ?Plan: ?1. Supervision of high risk pregnancy, antepartum ?Offer afp next visit ?Anatomy u/s already scheduled ?Start low dose ASA next visit ?- Genetic Screening ?- CBC/D/Plt+RPR+Rh+ABO+RubIgG... ?- Hemoglobin A1c ?- Culture, OB Urine ?- CHL AMB BABYSCRIPTS SCHEDULE OPTIMIZATION ?- TSH ?- T4, free ?- T4 ?- T3 ?- Comprehensive metabolic panel ?- Protein / creatinine ratio, urine ? ?2. [redacted] weeks gestation of pregnancy ? ?3. Chronic hypertension affecting pregnancy ?No issues with norvasc ? ?4. Asthma affecting pregnancy in first trimester ?Using albuterol qday. I told her goal is 2-3x/wk at the most. Continue singulair. She states she received an INH from a pulmonologist 2-3 months ago but never started it; she thinks maybe advair. I told her to let us know what it is but she needs to be on a maintenance INH ? ?5. Hyperthyroidism affecting pregnancy in first trimester ?Last seen by endocrine with her last pregnancy. Will check baseline labs. I told her to contact them and let them know she's pregnant for future f/u. Last pregnancy, thyroid enlarged and she had to start mmz ? ?6. Sickle cell trait (HCC) ?Ucx qtrimester ? ?Problem list reviewed and updated. ? ?Follow up in 4 weeks. ? ?>50% of 45 min visit spent on counseling and coordination of care.  ?  ? ?Cornelia Copa MD ?Attending ?Center for [employer] [facility]) ? ?

## 2021-09-17 LAB — CBC/D/PLT+RPR+RH+ABO+RUBIGG...
Antibody Screen: NEGATIVE
Basophils Absolute: 0 10*3/uL (ref 0.0–0.2)
Basos: 1 %
EOS (ABSOLUTE): 0.1 10*3/uL (ref 0.0–0.4)
Eos: 1 %
HCV Ab: NONREACTIVE
HIV Screen 4th Generation wRfx: NONREACTIVE
Hematocrit: 38 % (ref 34.0–46.6)
Hemoglobin: 13.1 g/dL (ref 11.1–15.9)
Hepatitis B Surface Ag: NEGATIVE
Immature Grans (Abs): 0 10*3/uL (ref 0.0–0.1)
Immature Granulocytes: 0 %
Lymphocytes Absolute: 2.2 10*3/uL (ref 0.7–3.1)
Lymphs: 36 %
MCH: 28.2 pg (ref 26.6–33.0)
MCHC: 34.5 g/dL (ref 31.5–35.7)
MCV: 82 fL (ref 79–97)
Monocytes Absolute: 0.3 10*3/uL (ref 0.1–0.9)
Monocytes: 5 %
Neutrophils Absolute: 3.4 10*3/uL (ref 1.4–7.0)
Neutrophils: 57 %
Platelets: 241 10*3/uL (ref 150–450)
RBC: 4.64 x10E6/uL (ref 3.77–5.28)
RDW: 14.3 % (ref 11.7–15.4)
RPR Ser Ql: NONREACTIVE
Rh Factor: POSITIVE
Rubella Antibodies, IGG: 1.51 index (ref >0.99)
WBC: 6 10*3/uL (ref 3.4–10.8)

## 2021-09-17 LAB — COMPREHENSIVE METABOLIC PANEL
ALT: 13 IU/L (ref 0–32)
AST: 21 IU/L (ref 0–40)
Albumin/Globulin Ratio: 1.2 (ref 1.2–2.2)
Albumin: 3.9 g/dL (ref 3.9–5.0)
Alkaline Phosphatase: 50 IU/L (ref 44–121)
BUN/Creatinine Ratio: 8 — ABNORMAL LOW (ref 9–23)
BUN: 5 mg/dL — ABNORMAL LOW (ref 6–20)
Bilirubin Total: 0.7 mg/dL (ref 0.0–1.2)
CO2: 19 mmol/L — ABNORMAL LOW (ref 20–29)
Calcium: 9.5 mg/dL (ref 8.7–10.2)
Chloride: 99 mmol/L (ref 96–106)
Creatinine, Ser: 0.63 mg/dL (ref 0.57–1.00)
Globulin, Total: 3.3 g/dL (ref 1.5–4.5)
Glucose: 87 mg/dL (ref 70–99)
Potassium: 3.9 mmol/L (ref 3.5–5.2)
Sodium: 133 mmol/L — ABNORMAL LOW (ref 134–144)
Total Protein: 7.2 g/dL (ref 6.0–8.5)
eGFR: 122 mL/min/{1.73_m2} (ref >59)

## 2021-09-17 LAB — T3: T3, Total: 159 ng/dL (ref 71–180)

## 2021-09-17 LAB — HEMOGLOBIN A1C
Est. average glucose Bld gHb Est-mCnc: 94 mg/dL
Hgb A1c MFr Bld: 4.9 % (ref 4.8–5.6)

## 2021-09-17 LAB — PROTEIN / CREATININE RATIO, URINE
Creatinine, Urine: 30.3 mg/dL
Protein, Ur: <4 mg/dL

## 2021-09-17 LAB — HCV INTERPRETATION

## 2021-09-17 LAB — T4: T4, Total: 9.4 ug/dL (ref 4.5–12.0)

## 2021-09-17 LAB — TSH: TSH: 0.153 u[IU]/mL — ABNORMAL LOW (ref 0.450–4.500)

## 2021-09-17 LAB — T4, FREE: Free T4: 0.96 ng/dL (ref 0.82–1.77)

## 2021-09-18 ENCOUNTER — Encounter: Payer: Self-pay | Admitting: *Deleted

## 2021-09-18 LAB — CULTURE, OB URINE

## 2021-09-18 LAB — URINE CULTURE, OB REFLEX

## 2021-09-21 DIAGNOSIS — Z419 Encounter for procedure for purposes other than remedying health state, unspecified: Secondary | ICD-10-CM | POA: Diagnosis not present

## 2021-10-01 ENCOUNTER — Encounter: Payer: Self-pay | Admitting: Obstetrics and Gynecology

## 2021-10-03 ENCOUNTER — Other Ambulatory Visit (HOSPITAL_COMMUNITY)
Admission: RE | Admit: 2021-10-03 | Discharge: 2021-10-03 | Disposition: A | Discharge status: Home / Self Care | Payer: Medicaid Other | Source: Ambulatory Visit | Attending: Family Medicine | Admitting: Family Medicine

## 2021-10-03 ENCOUNTER — Ambulatory Visit (INDEPENDENT_AMBULATORY_CARE_PROVIDER_SITE_OTHER): Payer: Medicaid Other | Admitting: Student

## 2021-10-03 VITALS — BP 137/70 | HR 91 | Wt 235.0 lb

## 2021-10-03 DIAGNOSIS — N898 Other specified noninflammatory disorders of vagina: Secondary | ICD-10-CM

## 2021-10-03 DIAGNOSIS — O0990 Supervision of high risk pregnancy, unspecified, unspecified trimester: Secondary | ICD-10-CM

## 2021-10-03 LAB — POCT URINALYSIS DIP (DEVICE)
Bilirubin Urine: NEGATIVE
Glucose, UA: NEGATIVE mg/dL
Hgb urine dipstick: NEGATIVE
Ketones, ur: NEGATIVE mg/dL
Leukocytes,Ua: NEGATIVE
Nitrite: NEGATIVE
Protein, ur: NEGATIVE mg/dL
Specific Gravity, Urine: 1.015 (ref 1.005–1.030)
Urobilinogen, UA: 0.2 mg/dL (ref 0.0–1.0)
pH: 7 (ref 5.0–8.0)

## 2021-10-03 NOTE — Progress Notes (Signed)
Here today with complaint of burning with urination and vaginal odor. Patient reports these symptoms began in the last few days. Burning with urination is improving. Also reports a blister to side of vagina that has been present for 1 week. UA today is not suspicious for UTI. Luna Kitchens, CNM to see patient for brief exam. ? Fleet Contras RN ?10/03/21 ?

## 2021-10-03 NOTE — Progress Notes (Signed)
?History:  ?Kathleen [name] is a 31 y.o. [phone] who presents to clinic today for NOB intake and reported to RN that she noticed a blister on her labia last week. SHe reports it has been itching and burning and she has been scratching at it. SHe thinks it is from shaving. She does endorse that her FOB has other partners and that he had a doctors appt this week and did not tell her what it was about.  ? ?The following portions of the patient's history were reviewed and updated as appropriate: allergies, current medications, family history, past medical history, social history, past surgical history and problem list. ? ?Review of Systems:  ?Review of Systems  ?Constitutional: Negative.   ?HENT: Negative.    ?Respiratory: Negative.    ?Cardiovascular: Negative.   ?Genitourinary: Negative.   ?Musculoskeletal: Negative.   ?Skin: Negative.   ?Neurological: Negative.   ?Psychiatric/Behavioral: Negative.    ? ?  ?Objective:  ?Physical Exam ?BP 137/70   Pulse 91   Wt 235 lb (106.6 kg)   LMP 06/28/2021   BMI 36.81 kg/m?  ?Physical Exam ?Exam conducted with a chaperone present.  ?Constitutional:   ?   Appearance: Normal appearance.  ?Abdominal:  ?   General: Abdomen is flat.  ?Genitourinary: ?   Exam position: Lithotomy position.  ?   Pubic Area: No rash or pubic lice.   ?   Labia:     ?   Right: Lesion present.   ? ? ?   Comments: Two small yellow lesions present, unclear if HSV sore that is nearly healed or if small cut that is healing; no fluid present and lesions are non-tender ?Skin: ?   General: Skin is warm.  ?   Capillary Refill: Capillary refill takes less than 2 seconds.  ?Neurological:  ?   General: No focal deficit present.  ?   Mental Status: She is alert.  ?Psychiatric:     ?   Mood and Affect: Mood normal.  ? ? ? ? ?Labs and Imaging ?Results for orders placed or performed in visit on 10/03/21 (from the past 24 hour(s))  ?POCT urinalysis dip (device)     Status: None  ? Collection Time: 10/03/21  9:48 AM   ?Result Value Ref Range  ? Glucose, UA NEGATIVE NEGATIVE mg/dL  ? Bilirubin Urine NEGATIVE NEGATIVE  ? Ketones, ur NEGATIVE NEGATIVE mg/dL  ? Specific Gravity, Urine 1.015 1.005 - 1.030  ? Hgb urine dipstick NEGATIVE NEGATIVE  ? pH 7.0 5.0 - 8.0  ? Protein, ur NEGATIVE NEGATIVE mg/dL  ? Urobilinogen, UA 0.2 0.0 - 1.0 mg/dL  ? Nitrite NEGATIVE NEGATIVE  ? Leukocytes,Ua NEGATIVE NEGATIVE  ? ? ?No results found. ? ?Health Maintenance Due  ?Topic Date Due  ? COVID-19 Vaccine (1) Never done  ? ? ? ?Assessment & Plan:  ?1. Supervision of high risk pregnancy, antepartum ? ? ?2. Vaginal odor ? ?- Cervicovaginal ancillary only( [place]) ? ?3. 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 She is not tender on exam; at this point, will wait for HSV culture to return ?-patient denies any other outbreaks; she was advised to notify us ASAP if she developes another lesion so we can do a proper HSV culture ?- Herpes simplex virus culture ? ? ? ?Approximately 15 minutes of total time was spent with this patient on exam and explanation of care plan.  ? ?Starr Lake,  CNM ?10/03/2021 ?12:41 PM ? ?

## 2021-10-04 LAB — CERVICOVAGINAL ANCILLARY ONLY
Bacterial Vaginitis (gardnerella): POSITIVE — AB
Candida Glabrata: NEGATIVE
Candida Vaginitis: NEGATIVE
Chlamydia: NEGATIVE
Comment: NEGATIVE
Comment: NEGATIVE
Comment: NEGATIVE
Comment: NEGATIVE
Comment: NEGATIVE
Comment: NORMAL
Neisseria Gonorrhea: NEGATIVE
Trichomonas: NEGATIVE

## 2021-10-05 LAB — HERPES SIMPLEX VIRUS CULTURE

## 2021-10-10 ENCOUNTER — Encounter: Payer: Self-pay | Admitting: Student

## 2021-10-10 DIAGNOSIS — N760 Acute vaginitis: Secondary | ICD-10-CM

## 2021-10-10 MED ORDER — METRONIDAZOLE 500 MG PO TABS: 500.0000 mg | ORAL_TABLET | Freq: Two times a day (BID) | ORAL | 0 refills | Status: DC

## 2021-10-14 ENCOUNTER — Encounter: Payer: Self-pay | Admitting: Obstetrics and Gynecology

## 2021-10-14 ENCOUNTER — Ambulatory Visit (INDEPENDENT_AMBULATORY_CARE_PROVIDER_SITE_OTHER): Payer: Medicaid Other | Admitting: Obstetrics and Gynecology

## 2021-10-14 VITALS — BP 120/68 | HR 88 | Wt 233.0 lb

## 2021-10-14 DIAGNOSIS — Z3A15 15 weeks gestation of pregnancy: Secondary | ICD-10-CM | POA: Diagnosis not present

## 2021-10-14 DIAGNOSIS — E0590 Thyrotoxicosis, unspecified without thyrotoxic crisis or storm: Secondary | ICD-10-CM | POA: Diagnosis not present

## 2021-10-14 DIAGNOSIS — Z3492 Encounter for supervision of normal pregnancy, unspecified, second trimester: Secondary | ICD-10-CM | POA: Diagnosis not present

## 2021-10-14 DIAGNOSIS — O10919 Unspecified pre-existing hypertension complicating pregnancy, unspecified trimester: Secondary | ICD-10-CM

## 2021-10-14 DIAGNOSIS — O0990 Supervision of high risk pregnancy, unspecified, unspecified trimester: Secondary | ICD-10-CM

## 2021-10-14 MED ORDER — ASPIRIN EC 81 MG PO TBEC: 81.0000 mg | DELAYED_RELEASE_TABLET | Freq: Every day | ORAL | 2 refills | Status: DC

## 2021-10-14 NOTE — Patient Instructions (Signed)

## 2021-10-14 NOTE — Progress Notes (Signed)
Subjective:  ?Kathleen [name] is a 31 y.o. [id number] at [redacted]w[redacted]d being seen today for ongoing prenatal care.  She is currently monitored for the following issues for this high-risk pregnancy and has Chronic hypertension affecting pregnancy; Asthma affecting pregnancy in first trimester; Sickle cell trait ([place]); Hyperthyroidism affecting pregnancy; Hyperthyroidism; and Supervision of high risk pregnancy, antepartum on their problem list. ? ?Patient reports no complaints.  Contractions: Not present. Vag. Bleeding: None.  Movement: Present. Denies leaking of fluid.  ? ?The following portions of the patient's history were reviewed and updated as appropriate: allergies, current medications, past family history, past medical history, past social history, past surgical history and problem list. Problem list updated. ? ?Objective:  ? ?Vitals:  ? 10/14/21 1056  ?BP: 120/68  ?Pulse: 88  ?Weight: 233 lb (105.7 kg)  ? ? ?Fetal Status:     Movement: Present    ? ?General:  Alert, oriented and cooperative. Patient is in no acute distress.  ?Skin: Skin is warm and dry. No rash noted.   ?Cardiovascular: Normal heart rate noted  ?Respiratory: Normal respiratory effort, no problems with respiration noted  ?Abdomen: Soft, gravid, appropriate for gestational age. Pain/Pressure: Absent     ?Pelvic:  Cervical exam deferred        ?Extremities: Normal range of motion.     ?Mental Status: Normal mood and affect. Normal behavior. Normal judgment and thought content.  ? ?Urinalysis:     ? ?Assessment and Plan:  ?Pregnancy: [id number] at [redacted]w[redacted]d ? ?1. [redacted] weeks gestation of pregnancy ? ?- AFP, Serum, Open Spina Bifida ? ?2. Supervision of high risk pregnancy, antepartum ?Stable ?Anatomy scan schedudle ? ?3. Hyperthyroidism ?Has appt with Endo in June ?- Thyroid Panel With TSH ? ?4. Chronic hypertension affecting pregnancy ?BP stable ?Start BASA, qd ?- aspirin EC 81 MG tablet; Take 1 tablet (81 mg total) by mouth daily. Take after 12 weeks for prevention  of preeclampsia later in pregnancy  Dispense: 300 tablet; Refill: 2 ? ?Preterm labor symptoms and general obstetric precautions including but not limited to vaginal bleeding, contractions, leaking of fluid and fetal movement were reviewed in detail with the patient. ?Please refer to After Visit Summary for other counseling recommendations.  ?Return in about 4 weeks (around 11/11/2021) for OB visit, face to face, MD only. ? ? ?Chancy Milroy, MD ?

## 2021-10-15 ENCOUNTER — Telehealth: Payer: Self-pay | Admitting: Lactation Services

## 2021-10-15 LAB — THYROID PANEL WITH TSH
Free Thyroxine Index: 0.8 — ABNORMAL LOW (ref 1.2–4.9)
T3 Uptake Ratio: 8 % — ABNORMAL LOW (ref 24–39)
T4, Total: 10.6 ug/dL (ref 4.5–12.0)
TSH: 0.792 u[IU]/mL (ref 0.450–4.500)

## 2021-10-15 NOTE — Telephone Encounter (Signed)
Called patient at request of Valentino Hue, prenatal Navigator. Patient is at work now and would prefer to be called around 5 pm. Reviewed I will try to call her at a later time around 5 pm. Patient voiced understanding.  ?

## 2021-10-16 LAB — AFP, SERUM, OPEN SPINA BIFIDA
AFP MoM: 0.91
AFP Value: 24.2 ng/mL
Gest. Age on Collection Date: 15.3 weeks
Maternal Age At EDD: 31.4 yr
OSBR Risk 1 IN: 10000
Test Results:: NEGATIVE
Weight: 233 [lb_av]

## 2021-10-21 DIAGNOSIS — Z419 Encounter for procedure for purposes other than remedying health state, unspecified: Secondary | ICD-10-CM | POA: Diagnosis not present

## 2021-11-08 ENCOUNTER — Ambulatory Visit: Payer: Medicaid Other | Admitting: *Deleted

## 2021-11-08 ENCOUNTER — Ambulatory Visit: Payer: Medicaid Other | Attending: Obstetrics and Gynecology

## 2021-11-08 ENCOUNTER — Encounter: Payer: Self-pay | Admitting: *Deleted

## 2021-11-08 ENCOUNTER — Other Ambulatory Visit: Payer: Self-pay | Admitting: *Deleted

## 2021-11-08 VITALS — BP 118/59 | HR 98

## 2021-11-08 DIAGNOSIS — O0990 Supervision of high risk pregnancy, unspecified, unspecified trimester: Secondary | ICD-10-CM

## 2021-11-08 DIAGNOSIS — E0590 Thyrotoxicosis, unspecified without thyrotoxic crisis or storm: Secondary | ICD-10-CM

## 2021-11-08 DIAGNOSIS — O10912 Unspecified pre-existing hypertension complicating pregnancy, second trimester: Secondary | ICD-10-CM

## 2021-11-08 DIAGNOSIS — Z362 Encounter for other antenatal screening follow-up: Secondary | ICD-10-CM

## 2021-11-12 ENCOUNTER — Ambulatory Visit (INDEPENDENT_AMBULATORY_CARE_PROVIDER_SITE_OTHER): Payer: Medicaid Other | Admitting: Family Medicine

## 2021-11-12 ENCOUNTER — Encounter: Payer: Self-pay | Admitting: Family Medicine

## 2021-11-12 VITALS — BP 118/73 | HR 97 | Wt 240.0 lb

## 2021-11-12 DIAGNOSIS — O10919 Unspecified pre-existing hypertension complicating pregnancy, unspecified trimester: Secondary | ICD-10-CM

## 2021-11-12 DIAGNOSIS — E0590 Thyrotoxicosis, unspecified without thyrotoxic crisis or storm: Secondary | ICD-10-CM | POA: Diagnosis not present

## 2021-11-12 DIAGNOSIS — O99280 Endocrine, nutritional and metabolic diseases complicating pregnancy, unspecified trimester: Secondary | ICD-10-CM | POA: Diagnosis not present

## 2021-11-12 DIAGNOSIS — O0990 Supervision of high risk pregnancy, unspecified, unspecified trimester: Secondary | ICD-10-CM

## 2021-11-12 DIAGNOSIS — Z3A19 19 weeks gestation of pregnancy: Secondary | ICD-10-CM

## 2021-11-12 DIAGNOSIS — D573 Sickle-cell trait: Secondary | ICD-10-CM

## 2021-11-12 NOTE — Progress Notes (Signed)
   PRENATAL VISIT NOTE  Subjective:  Kathleen [name] is a 31 y.o. [id number] at [redacted]w[redacted]d being seen today for ongoing prenatal care.  She is currently monitored for the following issues for this high-risk pregnancy and has Chronic hypertension affecting pregnancy; Asthma affecting pregnancy in first trimester; Sickle cell trait (HCC); Hyperthyroidism affecting pregnancy; Hyperthyroidism; and Supervision of high risk pregnancy, antepartum on their problem list.  Patient reports no complaints.  Contractions: Not present. Vag. Bleeding: None.  Movement: Present. Denies leaking of fluid.   The following portions of the patient's history were reviewed and updated as appropriate: allergies, current medications, past family history, past medical history, past social history, past surgical history and problem list.   Objective:   Vitals:   11/12/21 1035  BP: 118/73  Pulse: 97  Weight: 240 lb (108.9 kg)    Fetal Status: Fetal Heart Rate (bpm): 158   Movement: Present  General:  Alert, oriented and cooperative. Patient is in no acute distress.  Skin: Skin is warm and dry. No rash noted.   Cardiovascular: Normal heart rate noted.  Respiratory: Normal respiratory effort, no problems with respiration noted.  Abdomen: Soft, gravid, appropriate for gestational age.       Pelvic: Cervical exam deferred.  Extremities: Normal range of motion.  Mental Status: Normal mood and affect. Normal behavior. Normal judgment and thought content.   Assessment and Plan:  Pregnancy: [id number] at [redacted]w[redacted]d  1. Supervision of high risk pregnancy, antepartum 2. [redacted] weeks gestation of pregnancy Progressing well. FHT within normal limits, VSS. Will follow up for next OB visit in 4 weeks.   3. Chronic hypertension affecting pregnancy BP within normal limits today. Taking ASA 81 mg and Norvasc 5 mg daily. Pharmacy able to fill prescription today for refills as requested.   4. Hyperthyroidism affecting pregnancy,  antepartum Stable 4 weeks ago. Will repeat today. No medications currently. Has Endocrine appointment next month.   5. Sickle cell trait (HCC) Reports FOB is negative.   Preterm labor symptoms and general obstetric precautions including but not limited to vaginal bleeding, contractions, leaking of fluid and fetal movement were reviewed in detail with the patient.  Please refer to After Visit Summary for other counseling recommendations.   Return in about 4 weeks (around 12/10/2021) for follow up HR OB visit.  Future Appointments  Date Time Provider Department Center  12/09/2021  9:15 AM [facility] NURSE [facility]  12/09/2021  9:30 AM [facility] US[facility]   Worthy Rancher, MD

## 2021-11-12 NOTE — Progress Notes (Signed)
Patient informed me that she is out of Norvasc and baby Asprin. When she called to refill her RX she was told that it will not be ready until this Friday.  [facility] ([address] was called and Amlodipine 5mg  and baby Asprin 81 was requested for refill.  Patient was notified about Rx being refilled and she verbalized understanding

## 2021-11-13 LAB — THYROID PANEL WITH TSH
Free Thyroxine Index: 1.2 (ref 1.2–4.9)
T3 Uptake Ratio: 11 % — ABNORMAL LOW (ref 24–39)
T4, Total: 10.9 ug/dL (ref 4.5–12.0)
TSH: 1.11 u[IU]/mL (ref 0.450–4.500)

## 2021-11-14 NOTE — Telephone Encounter (Signed)
Called mom and spoke with her about breast feeding. She reports she does not have any specific questions about breast feeding at this time. Reviewed with mom that there is Lactation support in the hospital and at the [facility] after delivery as needed.   Reviewed with mom to call Insurance to go ahead and order her breast pump by calling [facility] to order ahead of delivery. Discussed I will send her a My Chart message with information. Patient voiced understanding.   Mom asked about having infant circumcised, reviewed that per Dr. Shawnie Pons Circumcisions are covered by [facility] and that mom is to notify hospital staff and can be completed in hospital.

## 2021-11-21 DIAGNOSIS — Z419 Encounter for procedure for purposes other than remedying health state, unspecified: Secondary | ICD-10-CM | POA: Diagnosis not present

## 2021-11-25 ENCOUNTER — Encounter: Payer: Self-pay | Admitting: Student

## 2021-11-26 ENCOUNTER — Other Ambulatory Visit: Payer: Self-pay

## 2021-11-26 DIAGNOSIS — N898 Other specified noninflammatory disorders of vagina: Secondary | ICD-10-CM

## 2021-11-26 MED ORDER — TERCONAZOLE 0.4 % VA CREA: 1.0000 | TOPICAL_CREAM | Freq: Every day | VAGINAL | 0 refills | Status: DC

## 2021-11-26 NOTE — Progress Notes (Deleted)
era

## 2021-12-09 ENCOUNTER — Ambulatory Visit: Payer: Medicaid Other | Attending: Obstetrics

## 2021-12-09 ENCOUNTER — Other Ambulatory Visit: Payer: Self-pay | Admitting: *Deleted

## 2021-12-09 ENCOUNTER — Encounter: Payer: Self-pay | Admitting: *Deleted

## 2021-12-09 ENCOUNTER — Ambulatory Visit: Payer: Medicaid Other | Admitting: *Deleted

## 2021-12-09 VITALS — BP 128/61 | HR 105

## 2021-12-09 DIAGNOSIS — D573 Sickle-cell trait: Secondary | ICD-10-CM | POA: Diagnosis not present

## 2021-12-09 DIAGNOSIS — Z3A23 23 weeks gestation of pregnancy: Secondary | ICD-10-CM

## 2021-12-09 DIAGNOSIS — Z6834 Body mass index (BMI) 34.0-34.9, adult: Secondary | ICD-10-CM

## 2021-12-09 DIAGNOSIS — O99282 Endocrine, nutritional and metabolic diseases complicating pregnancy, second trimester: Secondary | ICD-10-CM | POA: Diagnosis not present

## 2021-12-09 DIAGNOSIS — Z362 Encounter for other antenatal screening follow-up: Secondary | ICD-10-CM | POA: Diagnosis not present

## 2021-12-09 DIAGNOSIS — E0590 Thyrotoxicosis, unspecified without thyrotoxic crisis or storm: Secondary | ICD-10-CM

## 2021-12-09 DIAGNOSIS — O0990 Supervision of high risk pregnancy, unspecified, unspecified trimester: Secondary | ICD-10-CM | POA: Insufficient documentation

## 2021-12-09 DIAGNOSIS — O10912 Unspecified pre-existing hypertension complicating pregnancy, second trimester: Secondary | ICD-10-CM | POA: Insufficient documentation

## 2021-12-09 DIAGNOSIS — O99280 Endocrine, nutritional and metabolic diseases complicating pregnancy, unspecified trimester: Secondary | ICD-10-CM

## 2021-12-09 DIAGNOSIS — O10012 Pre-existing essential hypertension complicating pregnancy, second trimester: Secondary | ICD-10-CM

## 2021-12-09 DIAGNOSIS — O285 Abnormal chromosomal and genetic finding on antenatal screening of mother: Secondary | ICD-10-CM | POA: Diagnosis not present

## 2021-12-11 ENCOUNTER — Ambulatory Visit (INDEPENDENT_AMBULATORY_CARE_PROVIDER_SITE_OTHER): Payer: Medicaid Other | Admitting: Certified Nurse Midwife

## 2021-12-11 ENCOUNTER — Other Ambulatory Visit (HOSPITAL_COMMUNITY)
Admission: RE | Admit: 2021-12-11 | Discharge: 2021-12-11 | Disposition: A | Discharge status: Home / Self Care | Payer: Medicaid Other | Source: Ambulatory Visit | Attending: Certified Nurse Midwife | Admitting: Certified Nurse Midwife

## 2021-12-11 VITALS — BP 127/64 | HR 99 | Wt 249.0 lb

## 2021-12-11 DIAGNOSIS — E0590 Thyrotoxicosis, unspecified without thyrotoxic crisis or storm: Secondary | ICD-10-CM

## 2021-12-11 DIAGNOSIS — Z3A23 23 weeks gestation of pregnancy: Secondary | ICD-10-CM

## 2021-12-11 DIAGNOSIS — O99282 Endocrine, nutritional and metabolic diseases complicating pregnancy, second trimester: Secondary | ICD-10-CM

## 2021-12-11 DIAGNOSIS — N939 Abnormal uterine and vaginal bleeding, unspecified: Secondary | ICD-10-CM | POA: Insufficient documentation

## 2021-12-11 DIAGNOSIS — O0992 Supervision of high risk pregnancy, unspecified, second trimester: Secondary | ICD-10-CM

## 2021-12-11 DIAGNOSIS — O10919 Unspecified pre-existing hypertension complicating pregnancy, unspecified trimester: Secondary | ICD-10-CM

## 2021-12-11 NOTE — Progress Notes (Signed)
   PRENATAL VISIT NOTE  Subjective:  Kathleen [name] is a 31 y.o. [id number] at [redacted]w[redacted]d being seen today for ongoing prenatal care.  She is currently monitored for the following issues for this high-risk pregnancy and has Chronic hypertension affecting pregnancy; Asthma affecting pregnancy in first trimester; Sickle cell trait (HCC); Hyperthyroidism affecting pregnancy; Hyperthyroidism; and Supervision of high risk pregnancy, antepartum on their problem list.  Patient reports  almost daily pink to brown spotting, no cramping, discharge or foul odor .  Contractions: Not present. Vag. Bleeding: None.  Movement: Present. Denies leaking of fluid.   The following portions of the patient's history were reviewed and updated as appropriate: allergies, current medications, past family history, past medical history, past social history, past surgical history and problem list.   Objective:   Vitals:   12/11/21 1623  BP: 127/64  Pulse: 99  Weight: 249 lb (112.9 kg)   Fetal Status: Fetal Heart Rate (bpm): 152   Movement: Present     General:  Alert, oriented and cooperative. Patient is in no acute distress.  Skin: Skin is warm and dry. No rash noted.   Cardiovascular: Normal heart rate noted  Respiratory: Normal respiratory effort, no problems with respiration noted  Abdomen: Soft, gravid, appropriate for gestational age.  Pain/Pressure: Present     Pelvic: Cervical exam deferred        Extremities: Normal range of motion.  Edema: Trace  Mental Status: Normal mood and affect. Normal behavior. Normal judgment and thought content.   Assessment and Plan:  Pregnancy: [id number] at [redacted]w[redacted]d 1. Supervision of high risk pregnancy in second trimester - Doing well, feeling regular and vigorous fetal movement   2. [redacted] weeks gestation of pregnancy - Routine OB care including anticipatory guidance re GTT at next visit  3. Chronic hypertension affecting pregnancy - BP stable on amlodipine  4. Hyperthyroidism  affecting pregnancy in second trimester - Seeing endocrinologist on 7/8  5. Vaginal spotting - Last MFM visit cleared her of placenta previa or shortened cervix - Cervicovaginal ancillary only( [place])  Preterm labor symptoms and general obstetric precautions including but not limited to vaginal bleeding, contractions, leaking of fluid and fetal movement were reviewed in detail with the patient. Please refer to After Visit Summary for other counseling recommendations.   Return in 4 weeks (on 01/08/2022) for IN-PERSON, HOB/GTT.  Future Appointments  Date Time Provider Department Center  01/06/2022  9:15 AM [facility] NURSE [facility]  01/06/2022  9:30 AM [facility] US2 [facility]  01/08/2022  8:20 AM [facility] LAB [facility]  01/08/2022  9:35 AM Milas Hock, MD [facility]    Bernerd Limbo, CNM

## 2021-12-12 LAB — CERVICOVAGINAL ANCILLARY ONLY
Bacterial Vaginitis (gardnerella): NEGATIVE
Candida Glabrata: NEGATIVE
Candida Vaginitis: POSITIVE — AB
Chlamydia: NEGATIVE
Comment: NEGATIVE
Comment: NEGATIVE
Comment: NEGATIVE
Comment: NEGATIVE
Comment: NEGATIVE
Comment: NORMAL
Neisseria Gonorrhea: NEGATIVE
Trichomonas: NEGATIVE

## 2021-12-21 DIAGNOSIS — Z419 Encounter for procedure for purposes other than remedying health state, unspecified: Secondary | ICD-10-CM | POA: Diagnosis not present

## 2021-12-27 DIAGNOSIS — R000 Tachycardia, unspecified: Secondary | ICD-10-CM | POA: Diagnosis not present

## 2021-12-27 DIAGNOSIS — Z3A26 26 weeks gestation of pregnancy: Secondary | ICD-10-CM | POA: Diagnosis not present

## 2021-12-27 DIAGNOSIS — E0590 Thyrotoxicosis, unspecified without thyrotoxic crisis or storm: Secondary | ICD-10-CM | POA: Diagnosis not present

## 2021-12-27 DIAGNOSIS — O99283 Endocrine, nutritional and metabolic diseases complicating pregnancy, third trimester: Secondary | ICD-10-CM | POA: Diagnosis not present

## 2021-12-27 DIAGNOSIS — I10 Essential (primary) hypertension: Secondary | ICD-10-CM | POA: Diagnosis not present

## 2022-01-01 ENCOUNTER — Other Ambulatory Visit: Payer: Self-pay

## 2022-01-01 DIAGNOSIS — O0990 Supervision of high risk pregnancy, unspecified, unspecified trimester: Secondary | ICD-10-CM

## 2022-01-05 NOTE — Progress Notes (Signed)
   PRENATAL VISIT NOTE  Subjective:  Kathleen [name] is a 31 y.o. [id number] at [redacted]w[redacted]d being seen today for ongoing prenatal care.  She is currently monitored for the following issues for this high-risk pregnancy and has Chronic hypertension affecting pregnancy; Asthma affecting pregnancy in third trimester; Sickle cell trait (HCC); Hyperthyroidism affecting pregnancy; Hyperthyroidism; and Supervision of high risk pregnancy, antepartum on their problem list.  Patient reports no complaints.  Contractions: Not present. Vag. Bleeding: None.  Movement: Present. Denies leaking of fluid.   The following portions of the patient's history were reviewed and updated as appropriate: allergies, current medications, past family history, past medical history, past social history, past surgical history and problem list.   Objective:   Vitals:   01/08/22 0824  BP: 124/70  Pulse: 96  Weight: 248 lb (112.5 kg)    Fetal Status: Fetal Heart Rate (bpm): 153 Fundal Height: 28 cm Movement: Present     General:  Alert, oriented and cooperative. Patient is in no acute distress.  Skin: Skin is warm and dry. No rash noted.   Cardiovascular: Normal heart rate noted  Respiratory: Normal respiratory effort, no problems with respiration noted  Abdomen: Soft, gravid, appropriate for gestational age.  Pain/Pressure: Present     Pelvic: Cervical exam deferred        Extremities: Normal range of motion.  Edema: None  Mental Status: Normal mood and affect. Normal behavior. Normal judgment and thought content.   Assessment and Plan:  Pregnancy: [id number] at [redacted]w[redacted]d 1. Chronic hypertension affecting pregnancy Norvasc for BP, ldASA Growth done on 7/17 and was 84%ile, 1259g, normal afi  2. Asthma affecting pregnancy in third trimester Taking only singulair for it and her inhaler. Using inhaler rarely  3. Hyperthyroidism affecting pregnancy in second trimester TFTs monthly - last done 7/7  4. Supervision of high risk  pregnancy, antepartum 28 wk labs today Offered and recommend tdap - pt accepts Rh pos  5. Unwanted fertility - Reviewed desire for sterilization. Reviewed the irreversible nature of the procedure - Discussed LARC methods available for the prevention of pregnancy that are at least equally effective to permanent sterilization. Discussed these methods can be used at the time of delivery I.e. Nexplanon or LnIUD - All questions answered. Following our conversation, the patient would like would like LARC options - she would like Cu-IUD placed at [facility] visit.    Preterm labor symptoms and general obstetric precautions including but not limited to vaginal bleeding, contractions, leaking of fluid and fetal movement were reviewed in detail with the patient. Please refer to After Visit Summary for other counseling recommendations.   Return in about 2 weeks (around 01/22/2022) for OB VISIT, MD or APP.  Future Appointments  Date Time Provider Department Center  01/08/2022  9:35 AM Milas Hock, MD [facility]  02/03/2022  8:30 AM [facility] NURSE [facility]  02/03/2022  8:45 AM [facility] US4 [facility]  02/10/2022  9:15 AM [facility] NURSE [facility]  02/10/2022  9:30 AM [facility] US3 [facility]  02/17/2022  8:30 AM [facility] NURSE [facility]  02/17/2022  8:45 AM [facility] US4 [facility]    Milas Hock, MD

## 2022-01-06 ENCOUNTER — Encounter: Payer: Self-pay | Admitting: *Deleted

## 2022-01-06 ENCOUNTER — Ambulatory Visit: Payer: Medicaid Other | Admitting: *Deleted

## 2022-01-06 ENCOUNTER — Ambulatory Visit: Payer: Medicaid Other | Attending: Obstetrics

## 2022-01-06 VITALS — BP 130/59 | HR 106

## 2022-01-06 DIAGNOSIS — D573 Sickle-cell trait: Secondary | ICD-10-CM | POA: Diagnosis not present

## 2022-01-06 DIAGNOSIS — Z3A27 27 weeks gestation of pregnancy: Secondary | ICD-10-CM | POA: Diagnosis not present

## 2022-01-06 DIAGNOSIS — O99019 Anemia complicating pregnancy, unspecified trimester: Secondary | ICD-10-CM | POA: Diagnosis not present

## 2022-01-06 DIAGNOSIS — O99282 Endocrine, nutritional and metabolic diseases complicating pregnancy, second trimester: Secondary | ICD-10-CM | POA: Diagnosis not present

## 2022-01-06 DIAGNOSIS — O0990 Supervision of high risk pregnancy, unspecified, unspecified trimester: Secondary | ICD-10-CM

## 2022-01-06 DIAGNOSIS — O285 Abnormal chromosomal and genetic finding on antenatal screening of mother: Secondary | ICD-10-CM

## 2022-01-06 DIAGNOSIS — O10012 Pre-existing essential hypertension complicating pregnancy, second trimester: Secondary | ICD-10-CM | POA: Diagnosis not present

## 2022-01-06 DIAGNOSIS — E0590 Thyrotoxicosis, unspecified without thyrotoxic crisis or storm: Secondary | ICD-10-CM | POA: Insufficient documentation

## 2022-01-06 DIAGNOSIS — Z6834 Body mass index (BMI) 34.0-34.9, adult: Secondary | ICD-10-CM | POA: Insufficient documentation

## 2022-01-06 DIAGNOSIS — O10912 Unspecified pre-existing hypertension complicating pregnancy, second trimester: Secondary | ICD-10-CM | POA: Diagnosis not present

## 2022-01-07 ENCOUNTER — Other Ambulatory Visit: Payer: Self-pay | Admitting: *Deleted

## 2022-01-07 DIAGNOSIS — O10919 Unspecified pre-existing hypertension complicating pregnancy, unspecified trimester: Secondary | ICD-10-CM

## 2022-01-08 ENCOUNTER — Other Ambulatory Visit: Payer: Self-pay

## 2022-01-08 ENCOUNTER — Encounter: Payer: Self-pay | Admitting: Obstetrics and Gynecology

## 2022-01-08 ENCOUNTER — Other Ambulatory Visit: Payer: Medicaid Other

## 2022-01-08 ENCOUNTER — Ambulatory Visit (INDEPENDENT_AMBULATORY_CARE_PROVIDER_SITE_OTHER): Payer: Medicaid Other | Admitting: Obstetrics and Gynecology

## 2022-01-08 VITALS — BP 124/70 | HR 96 | Wt 248.0 lb

## 2022-01-08 DIAGNOSIS — O0990 Supervision of high risk pregnancy, unspecified, unspecified trimester: Secondary | ICD-10-CM

## 2022-01-08 DIAGNOSIS — O10919 Unspecified pre-existing hypertension complicating pregnancy, unspecified trimester: Secondary | ICD-10-CM

## 2022-01-08 DIAGNOSIS — Z3009 Encounter for other general counseling and advice on contraception: Secondary | ICD-10-CM

## 2022-01-08 DIAGNOSIS — Z3A27 27 weeks gestation of pregnancy: Secondary | ICD-10-CM

## 2022-01-08 DIAGNOSIS — O99513 Diseases of the respiratory system complicating pregnancy, third trimester: Secondary | ICD-10-CM

## 2022-01-08 DIAGNOSIS — Z23 Encounter for immunization: Secondary | ICD-10-CM

## 2022-01-08 DIAGNOSIS — O99282 Endocrine, nutritional and metabolic diseases complicating pregnancy, second trimester: Secondary | ICD-10-CM

## 2022-01-08 DIAGNOSIS — J45909 Unspecified asthma, uncomplicated: Secondary | ICD-10-CM

## 2022-01-08 DIAGNOSIS — E0590 Thyrotoxicosis, unspecified without thyrotoxic crisis or storm: Secondary | ICD-10-CM

## 2022-01-09 ENCOUNTER — Other Ambulatory Visit: Payer: Self-pay | Admitting: *Deleted

## 2022-01-09 DIAGNOSIS — O10912 Unspecified pre-existing hypertension complicating pregnancy, second trimester: Secondary | ICD-10-CM

## 2022-01-09 LAB — CBC
Hematocrit: 38.2 % (ref 34.0–46.6)
Hemoglobin: 12.8 g/dL (ref 11.1–15.9)
MCH: 30.4 pg (ref 26.6–33.0)
MCHC: 33.5 g/dL (ref 31.5–35.7)
MCV: 91 fL (ref 79–97)
Platelets: 215 10*3/uL (ref 150–450)
RBC: 4.21 x10E6/uL (ref 3.77–5.28)
RDW: 12.9 % (ref 11.7–15.4)
WBC: 6.8 10*3/uL (ref 3.4–10.8)

## 2022-01-09 LAB — GLUCOSE TOLERANCE, 2 HOURS W/ 1HR
Glucose, 1 hour: 87 mg/dL (ref 70–179)
Glucose, 2 hour: 84 mg/dL (ref 70–152)
Glucose, Fasting: 66 mg/dL — ABNORMAL LOW (ref 70–91)

## 2022-01-09 LAB — RPR: RPR Ser Ql: NONREACTIVE

## 2022-01-09 LAB — HIV ANTIBODY (ROUTINE TESTING W REFLEX): HIV Screen 4th Generation wRfx: NONREACTIVE

## 2022-01-21 DIAGNOSIS — Z419 Encounter for procedure for purposes other than remedying health state, unspecified: Secondary | ICD-10-CM | POA: Diagnosis not present

## 2022-01-23 ENCOUNTER — Ambulatory Visit (INDEPENDENT_AMBULATORY_CARE_PROVIDER_SITE_OTHER): Payer: Medicaid Other | Admitting: Family Medicine

## 2022-01-23 ENCOUNTER — Other Ambulatory Visit: Payer: Self-pay

## 2022-01-23 VITALS — BP 116/75 | HR 105 | Wt 250.5 lb

## 2022-01-23 DIAGNOSIS — E0590 Thyrotoxicosis, unspecified without thyrotoxic crisis or storm: Secondary | ICD-10-CM

## 2022-01-23 DIAGNOSIS — O10919 Unspecified pre-existing hypertension complicating pregnancy, unspecified trimester: Secondary | ICD-10-CM

## 2022-01-23 DIAGNOSIS — O0990 Supervision of high risk pregnancy, unspecified, unspecified trimester: Secondary | ICD-10-CM

## 2022-01-23 DIAGNOSIS — O99283 Endocrine, nutritional and metabolic diseases complicating pregnancy, third trimester: Secondary | ICD-10-CM

## 2022-01-23 DIAGNOSIS — O99513 Diseases of the respiratory system complicating pregnancy, third trimester: Secondary | ICD-10-CM

## 2022-01-23 DIAGNOSIS — J45909 Unspecified asthma, uncomplicated: Secondary | ICD-10-CM

## 2022-01-23 MED ORDER — BUDESONIDE-FORMOTEROL FUMARATE 80-4.5 MCG/ACT IN AERO: 2.0000 | INHALATION_SPRAY | Freq: Two times a day (BID) | RESPIRATORY_TRACT | 12 refills | Status: DC

## 2022-01-23 NOTE — Progress Notes (Signed)
   PRENATAL VISIT NOTE  Subjective:  Kathleen [name] is a 31 y.o. [id number] at [redacted]w[redacted]d being seen today for ongoing prenatal care.  She is currently monitored for the following issues for this high-risk pregnancy and has Chronic hypertension affecting pregnancy; Asthma affecting pregnancy in third trimester; Sickle cell trait (HCC); Hyperthyroidism affecting pregnancy; Hyperthyroidism; and Supervision of high risk pregnancy, antepartum on their problem list.  Patient reports backache, no bleeding, and no cramping.  Contractions: Not present. Vag. Bleeding: None.  Movement: Present. Denies leaking of fluid.   The following portions of the patient's history were reviewed and updated as appropriate: allergies, current medications, past family history, past medical history, past social history, past surgical history and problem list.   Objective:   Vitals:   01/23/22 0950  BP: 116/75  Pulse: (!) 105  Weight: 250 lb 8 oz (113.6 kg)    Fetal Status: Fetal Heart Rate (bpm): 155 Fundal Height: 30 cm Movement: Present     General:  Alert, oriented and cooperative. Patient is in no acute distress.  Skin: Skin is warm and dry. No rash noted.   Cardiovascular: Normal heart rate noted  Respiratory: Normal respiratory effort, no problems with respiration noted  Abdomen: Soft, gravid, appropriate for gestational age.  Pain/Pressure: Absent     Pelvic: Cervical exam deferred        Extremities: Normal range of motion.  Edema: Trace  Mental Status: Normal mood and affect. Normal behavior. Normal judgment and thought content.   Assessment and Plan:  Pregnancy: [id number] at [redacted]w[redacted]d 1. Chronic hypertension affecting pregnancy -Taking amlodipine and Asprin each day as prescribed -BP in office within normal range -Continue on amlodipine 5mg  and Asprin 81 mg daily   2. Hyperthyroidism affecting pregnancy in third trimester -Denies symptoms of sweating, palpitations, tremor -Thyroid panel done on 12/27/21 within  normal range -Pt doing well without medication management at this time. Pt will continue to F/U with PCP  3. Supervision of high risk pregnancy, antepartum -Pt has attended fetal checks as scheduled. Reports no concerns or questions at this time.  4. Asthma affecting pregnancy in third trimester -Pt reports inc SOB with activity but denies wheezing or nighttime cough -albuterol use 1-2 times daily -Pt prescribed combination therapy for improved management of symptoms. Will F/U at next visit in 2 wks.  - budesonide-formoterol (SYMBICORT) 80-4.5 MCG/ACT inhaler; Inhale 2 puffs into the lungs in the morning and at bedtime.  Dispense: 1 each; Refill: 12  Preterm labor symptoms and general obstetric precautions including but not limited to vaginal bleeding, contractions, leaking of fluid and fetal movement were reviewed in detail with the patient. Please refer to After Visit Summary for other counseling recommendations.   Return in 2 weeks (on 02/06/2022) for [facility].  Future Appointments  Date Time Provider Department Center  02/03/2022  8:30 AM [facility] NURSE [facility]  02/03/2022  8:45 AM [facility] US4 [facility]  02/10/2022  9:15 AM [facility] NURSE [facility]  02/10/2022  9:30 AM [facility] US3 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

## 2022-02-03 ENCOUNTER — Encounter: Payer: Self-pay | Admitting: *Deleted

## 2022-02-03 ENCOUNTER — Ambulatory Visit: Payer: Medicaid Other | Attending: Obstetrics and Gynecology

## 2022-02-03 ENCOUNTER — Ambulatory Visit: Payer: Medicaid Other | Admitting: *Deleted

## 2022-02-03 ENCOUNTER — Other Ambulatory Visit: Payer: Self-pay | Admitting: *Deleted

## 2022-02-03 VITALS — BP 118/62 | HR 93

## 2022-02-03 DIAGNOSIS — E0590 Thyrotoxicosis, unspecified without thyrotoxic crisis or storm: Secondary | ICD-10-CM

## 2022-02-03 DIAGNOSIS — O285 Abnormal chromosomal and genetic finding on antenatal screening of mother: Secondary | ICD-10-CM

## 2022-02-03 DIAGNOSIS — O10913 Unspecified pre-existing hypertension complicating pregnancy, third trimester: Secondary | ICD-10-CM

## 2022-02-03 DIAGNOSIS — O0990 Supervision of high risk pregnancy, unspecified, unspecified trimester: Secondary | ICD-10-CM

## 2022-02-03 DIAGNOSIS — O10919 Unspecified pre-existing hypertension complicating pregnancy, unspecified trimester: Secondary | ICD-10-CM | POA: Insufficient documentation

## 2022-02-03 DIAGNOSIS — D573 Sickle-cell trait: Secondary | ICD-10-CM | POA: Diagnosis not present

## 2022-02-03 DIAGNOSIS — Z3A31 31 weeks gestation of pregnancy: Secondary | ICD-10-CM | POA: Diagnosis not present

## 2022-02-03 DIAGNOSIS — O99283 Endocrine, nutritional and metabolic diseases complicating pregnancy, third trimester: Secondary | ICD-10-CM | POA: Diagnosis not present

## 2022-02-03 DIAGNOSIS — O10013 Pre-existing essential hypertension complicating pregnancy, third trimester: Secondary | ICD-10-CM

## 2022-02-03 DIAGNOSIS — O280 Abnormal hematological finding on antenatal screening of mother: Secondary | ICD-10-CM

## 2022-02-10 ENCOUNTER — Ambulatory Visit: Payer: Medicaid Other | Admitting: *Deleted

## 2022-02-10 ENCOUNTER — Ambulatory Visit: Payer: Medicaid Other | Attending: Obstetrics and Gynecology

## 2022-02-10 ENCOUNTER — Ambulatory Visit (INDEPENDENT_AMBULATORY_CARE_PROVIDER_SITE_OTHER): Payer: Medicaid Other | Admitting: Obstetrics and Gynecology

## 2022-02-10 ENCOUNTER — Other Ambulatory Visit: Payer: Self-pay

## 2022-02-10 VITALS — BP 118/75 | HR 114 | Wt 252.9 lb

## 2022-02-10 VITALS — BP 113/59 | HR 106

## 2022-02-10 DIAGNOSIS — O285 Abnormal chromosomal and genetic finding on antenatal screening of mother: Secondary | ICD-10-CM

## 2022-02-10 DIAGNOSIS — O0990 Supervision of high risk pregnancy, unspecified, unspecified trimester: Secondary | ICD-10-CM | POA: Insufficient documentation

## 2022-02-10 DIAGNOSIS — O99283 Endocrine, nutritional and metabolic diseases complicating pregnancy, third trimester: Secondary | ICD-10-CM

## 2022-02-10 DIAGNOSIS — Z3A32 32 weeks gestation of pregnancy: Secondary | ICD-10-CM | POA: Diagnosis not present

## 2022-02-10 DIAGNOSIS — D573 Sickle-cell trait: Secondary | ICD-10-CM

## 2022-02-10 DIAGNOSIS — O10919 Unspecified pre-existing hypertension complicating pregnancy, unspecified trimester: Secondary | ICD-10-CM | POA: Insufficient documentation

## 2022-02-10 DIAGNOSIS — O10013 Pre-existing essential hypertension complicating pregnancy, third trimester: Secondary | ICD-10-CM

## 2022-02-10 DIAGNOSIS — J4540 Moderate persistent asthma, uncomplicated: Secondary | ICD-10-CM

## 2022-02-10 DIAGNOSIS — E0590 Thyrotoxicosis, unspecified without thyrotoxic crisis or storm: Secondary | ICD-10-CM | POA: Diagnosis not present

## 2022-02-10 DIAGNOSIS — J45909 Unspecified asthma, uncomplicated: Secondary | ICD-10-CM

## 2022-02-10 DIAGNOSIS — O99513 Diseases of the respiratory system complicating pregnancy, third trimester: Secondary | ICD-10-CM

## 2022-02-10 MED ORDER — AMLODIPINE BESYLATE 5 MG PO TABS: 5.0000 mg | ORAL_TABLET | Freq: Every day | ORAL | 4 refills | Status: DC

## 2022-02-10 MED ORDER — MONTELUKAST SODIUM 10 MG PO TABS: 10.0000 mg | ORAL_TABLET | Freq: Every day | ORAL | 11 refills | Status: AC

## 2022-02-10 NOTE — Progress Notes (Signed)
   PRENATAL VISIT NOTE  Subjective:  Kathleen [name] is a 31 y.o. [id number] at [redacted]w[redacted]d being seen today for ongoing prenatal care.  She is currently monitored for the following issues for this high-risk pregnancy and has Chronic hypertension affecting pregnancy; Asthma affecting pregnancy in third trimester; Sickle cell trait (HCC); Hyperthyroidism affecting pregnancy; Hyperthyroidism; and Supervision of high risk pregnancy, antepartum on their problem list.  Patient doing well with no acute concerns today. She reports no complaints.  Contractions: Not present. Vag. Bleeding: None.  Movement: Present. Denies leaking of fluid.   The following portions of the patient's history were reviewed and updated as appropriate: allergies, current medications, past family history, past medical history, past social history, past surgical history and problem list. Problem list updated.  Objective:   Vitals:   02/10/22 0855  BP: 118/75  Pulse: (!) 114  Weight: 252 lb 14.4 oz (114.7 kg)    Fetal Status: Fetal Heart Rate (bpm): 153 Fundal Height: 33 cm Movement: Present     General:  Alert, oriented and cooperative. Patient is in no acute distress.  Skin: Skin is warm and dry. No rash noted.   Cardiovascular: Normal heart rate noted  Respiratory: Normal respiratory effort, no problems with respiration noted  Abdomen: Soft, gravid, appropriate for gestational age.  Pain/Pressure: Absent     Pelvic: Cervical exam deferred        Extremities: Normal range of motion.  Edema: None  Mental Status:  Normal mood and affect. Normal behavior. Normal judgment and thought content.   Assessment and Plan:  Pregnancy: [id number] at [redacted]w[redacted]d  1. Moderate persistent asthma without complication No issues today - montelukast (SINGULAIR) 10 MG tablet; Take 1 tablet (10 mg total) by mouth at bedtime.  Dispense: 30 tablet; Refill: 11  2. [redacted] weeks gestation of pregnancy   3. Chronic hypertension affecting pregnancy BP well  controlled on current meds, refill given, pt likely to deliver between 37-39 weeks, ok for out of work at 36 weeks - amLODipine (NORVASC) 5 MG tablet; Take 1 tablet (5 mg total) by mouth daily.  Dispense: 30 tablet; Refill: 4  4. Asthma affecting pregnancy in third trimester   5. Hyperthyroidism affecting pregnancy in third trimester Recheck labs today - TSH + free T4  6. Sickle cell trait (HCC)   7. Supervision of high risk pregnancy, antepartum Continue routine prenatal care Fetal testin today  Preterm labor symptoms and general obstetric precautions including but not limited to vaginal bleeding, contractions, leaking of fluid and fetal movement were reviewed in detail with the patient.  Please refer to After Visit Summary for other counseling recommendations.   Return in about 2 weeks (around 02/24/2022) for [facility], in person.   Mariel Aloe, MD Faculty Attending Center for [facility]

## 2022-02-11 LAB — TSH+FREE T4
Free T4: 0.85 ng/dL (ref 0.82–1.77)
TSH: 1.01 u[IU]/mL (ref 0.450–4.500)

## 2022-02-12 ENCOUNTER — Encounter: Payer: Medicaid Other | Admitting: Obstetrics and Gynecology

## 2022-02-16 ENCOUNTER — Encounter: Payer: Self-pay | Admitting: Radiology

## 2022-02-17 ENCOUNTER — Ambulatory Visit: Payer: Medicaid Other | Admitting: *Deleted

## 2022-02-17 ENCOUNTER — Ambulatory Visit: Payer: Medicaid Other | Attending: Obstetrics and Gynecology

## 2022-02-17 ENCOUNTER — Encounter: Payer: Self-pay | Admitting: *Deleted

## 2022-02-17 VITALS — BP 112/55 | HR 102

## 2022-02-17 DIAGNOSIS — O0990 Supervision of high risk pregnancy, unspecified, unspecified trimester: Secondary | ICD-10-CM

## 2022-02-17 DIAGNOSIS — O99283 Endocrine, nutritional and metabolic diseases complicating pregnancy, third trimester: Secondary | ICD-10-CM | POA: Diagnosis not present

## 2022-02-17 DIAGNOSIS — E0590 Thyrotoxicosis, unspecified without thyrotoxic crisis or storm: Secondary | ICD-10-CM | POA: Diagnosis not present

## 2022-02-17 DIAGNOSIS — D573 Sickle-cell trait: Secondary | ICD-10-CM

## 2022-02-17 DIAGNOSIS — Z3A33 33 weeks gestation of pregnancy: Secondary | ICD-10-CM | POA: Diagnosis not present

## 2022-02-17 DIAGNOSIS — O10912 Unspecified pre-existing hypertension complicating pregnancy, second trimester: Secondary | ICD-10-CM | POA: Insufficient documentation

## 2022-02-17 DIAGNOSIS — O10013 Pre-existing essential hypertension complicating pregnancy, third trimester: Secondary | ICD-10-CM

## 2022-02-17 DIAGNOSIS — O285 Abnormal chromosomal and genetic finding on antenatal screening of mother: Secondary | ICD-10-CM

## 2022-02-18 ENCOUNTER — Encounter: Payer: Self-pay | Admitting: Obstetrics and Gynecology

## 2022-02-20 ENCOUNTER — Encounter (HOSPITAL_COMMUNITY): Payer: Self-pay | Admitting: Emergency Medicine

## 2022-02-20 ENCOUNTER — Ambulatory Visit (HOSPITAL_COMMUNITY)
Admission: EM | Admit: 2022-02-20 | Discharge: 2022-02-20 | Disposition: A | Discharge status: Home / Self Care | Payer: Medicaid Other | Attending: Emergency Medicine | Admitting: Emergency Medicine

## 2022-02-20 DIAGNOSIS — Z20822 Contact with and (suspected) exposure to covid-19: Secondary | ICD-10-CM | POA: Diagnosis not present

## 2022-02-20 DIAGNOSIS — O98513 Other viral diseases complicating pregnancy, third trimester: Secondary | ICD-10-CM

## 2022-02-20 DIAGNOSIS — B349 Viral infection, unspecified: Secondary | ICD-10-CM | POA: Diagnosis not present

## 2022-02-20 DIAGNOSIS — Z3A33 33 weeks gestation of pregnancy: Secondary | ICD-10-CM | POA: Diagnosis not present

## 2022-02-20 LAB — SARS CORONAVIRUS 2 BY RT PCR: SARS Coronavirus 2 by RT PCR: NEGATIVE

## 2022-02-20 LAB — POC INFLUENZA A AND B ANTIGEN (URGENT CARE ONLY)
INFLUENZA A ANTIGEN, POC: NEGATIVE
INFLUENZA B ANTIGEN, POC: NEGATIVE

## 2022-02-20 MED ORDER — GUAIFENESIN ER 600 MG PO TB12: 600.0000 mg | ORAL_TABLET | Freq: Two times a day (BID) | ORAL | 0 refills | Status: AC

## 2022-02-20 NOTE — ED Triage Notes (Signed)
Patient c/o productive cough, congestion and loss of voice x 1 day.  Patient denies any OTC meds.

## 2022-02-20 NOTE — Discharge Instructions (Addendum)
We will call you if the flu or covid test returns positive.  You can try the mucinex (guaifenesin) twice daily for nasal congestion and cough. Tylenol is also safe to use in pregnancy for fever, body aches, etc.  Try to drink at least 64 oz of water daily.  Please go to the emergency department if symptoms worsen.

## 2022-02-20 NOTE — ED Provider Notes (Signed)
[facility]    CSN: [id number] Arrival date & time: 02/20/22  0803      History   Chief Complaint Chief Complaint  Patient presents with   Cough    HPI Kathleen [name] is a 31 y.o. female.  Presents with 1 day history of nasal congestion, hoarse voice, productive cough. She is 7 months pregnant. Has not tried any medicines  Close exposure to COVID at work a few days ago  Denies fever, chills, sore throat, headache, chest pain, shortness of breath, abdominal pain, nausea, vomiting/diarrhea, rash, urinary symptoms  Past Medical History:  Diagnosis Date   Asthma    Hypertension    Hyperthyroidism     Patient Active Problem List   Diagnosis Date Noted   Supervision of high risk pregnancy, antepartum 08/29/2021   Hyperthyroidism 09/28/2020   Hyperthyroidism affecting pregnancy 04/04/2019   Sickle cell trait (HCC) 03/21/2019   Chronic hypertension affecting pregnancy 03/07/2019   Asthma affecting pregnancy in third trimester 03/07/2019    Past Surgical History:  Procedure Laterality Date   DILATION AND EVACUATION N/A 12/16/2012   Procedure: DILATATION AND EVACUATION;  Surgeon: Serita Kyle, MD;  Location: [facility];  Service: Gynecology;  Laterality: N/A;   WISDOM TOOTH EXTRACTION  2009    OB History     Gravida  5   Para  2   Term  2   Preterm  0   AB  2   Living  2      SAB  2   IAB  0   Ectopic  0   Multiple  0   Live Births  2            Home Medications    Prior to Admission medications   Medication Sig Start Date End Date Taking? Authorizing Provider  albuterol (VENTOLIN HFA) 108 (90 Base) MCG/ACT inhaler Inhale 2 puffs into the lungs every 6 (six) hours as needed for wheezing or shortness of breath. 09/06/21  Yes Anyanwu, Jethro Bastos, MD  amLODipine (NORVASC) 5 MG tablet Take 1 tablet (5 mg total) by mouth daily. 02/10/22  Yes Warden Fillers, MD  aspirin EC 81 MG tablet Take 1 tablet (81 mg total) by mouth daily.  Take after 12 weeks for prevention of preeclampsia later in pregnancy 10/14/21  Yes Hermina Staggers, MD  budesonide-formoterol [facility]) 80-4.5 MCG/ACT inhaler Inhale 2 puffs into the lungs in the morning and at bedtime. 01/23/22  Yes Reva Bores, MD  guaiFENesin (MUCINEX) 600 MG 12 hr tablet Take 1 tablet (600 mg total) by mouth 2 (two) times daily for 5 days. 02/20/22 02/25/22 Yes Wyeth Hoffer, PA-C  montelukast (SINGULAIR) 10 MG tablet Take 1 tablet (10 mg total) by mouth at bedtime. 02/10/22  Yes Warden Fillers, MD  Prenatal Vit-Fe Fumarate-FA (M-NATAL PLUS) 27-1 MG TABS TAKE 1 TABLET BY MOUTH DAILY AT 12 NOON. 09/04/21  Yes Warden Fillers, MD    Family History Family History  Problem Relation Age of Onset   Cancer Mother    Hypertension Mother     Social History Social History   Tobacco Use   Smoking status: Former   Smokeless tobacco: Never  Building services engineer Use: Never used  Substance Use Topics   Alcohol use: No   Drug use: Not Currently    Types: Marijuana    Comment: last smoked over a yr ago     Allergies   Patient has no known  allergies.   Review of Systems Review of Systems  Respiratory:  Positive for cough.    Per HPI  Physical Exam Triage Vital Signs ED Triage Vitals  Enc Vitals Group     BP 02/20/22 0831 115/69     Pulse Rate 02/20/22 0831 (!) 103     Resp 02/20/22 0831 18     Temp 02/20/22 0831 98.2 F (36.8 C)     Temp Source 02/20/22 0831 Oral     SpO2 02/20/22 0831 97 %     Weight 02/20/22 0832 252 lb (114.3 kg)     Height 02/20/22 0832 5\' 8"  (1.727 m)     Head Circumference --      Peak Flow --      Pain Score 02/20/22 0832 0     Pain Loc --      Pain Edu? --      Excl. in GC? --    No data found.  Updated Vital Signs BP 115/69 (BP Location: Right Arm)   Pulse (!) 103   Temp 98.2 F (36.8 C) (Oral)   Resp 18   Ht 5\' 8"  (1.727 m)   Wt 252 lb (114.3 kg)   LMP 06/28/2021   SpO2 97%   Breastfeeding No   BMI 38.32 kg/m    Physical Exam Vitals and nursing note reviewed.  Constitutional:      General: She is not in acute distress.    Appearance: Normal appearance. She is not ill-appearing.  HENT:     Nose: No congestion.     Mouth/Throat:     Mouth: Mucous membranes are moist.     Pharynx: Oropharynx is clear. Uvula midline. No oropharyngeal exudate or posterior oropharyngeal erythema.     Tonsils: No tonsillar exudate or tonsillar abscesses.  Eyes:     Conjunctiva/sclera: Conjunctivae normal.  Cardiovascular:     Rate and Rhythm: Normal rate and regular rhythm.     Pulses: Normal pulses.     Heart sounds: Normal heart sounds.  Pulmonary:     Effort: Pulmonary effort is normal. No respiratory distress.     Breath sounds: Normal breath sounds. No wheezing.  Musculoskeletal:     Cervical back: Normal range of motion. No rigidity.  Lymphadenopathy:     Cervical: No cervical adenopathy.  Skin:    General: Skin is warm and dry.  Neurological:     Mental Status: She is alert and oriented to person, place, and time.     UC Treatments / Results  Labs (all labs ordered are listed, but only abnormal results are displayed) Labs Reviewed  SARS CORONAVIRUS 2 BY RT PCR  POC INFLUENZA A AND B ANTIGEN (URGENT CARE ONLY)    EKG  Radiology No results found.  Procedures Procedures   Medications Ordered in UC Medications - No data to display  Initial Impression / Assessment and Plan / UC Course  I have reviewed the triage vital signs and the nursing notes.  Pertinent labs & imaging results that were available during my care of the patient were reviewed by me and considered in my medical decision making (see chart for details).  Likely viral etiology, I suspect COVID given the close contact recently. Test pending. If positive she does not want antivirals. Flu pending. Recommend symptomatic care at home, discussed Mucinex and Tylenol are safe for use in pregnancy.  Can try nasal spray like Flonase  or daily allergy med.  Increase water intake. Return precautions discussed. Patient agrees  to plan  Final Clinical Impressions(s) / UC Diagnoses   Final diagnoses:  Close exposure to COVID-19 virus  Viral illness  [redacted] weeks gestation of pregnancy     Discharge Instructions      We will call you if the flu or covid test returns positive.  You can try the mucinex (guaifenesin) twice daily for nasal congestion and cough. Tylenol is also safe to use in pregnancy for fever, body aches, etc.  Try to drink at least 64 oz of water daily.  Please go to the emergency department if symptoms worsen.     ED Prescriptions     Medication Sig Dispense Auth. Provider   guaiFENesin (MUCINEX) 600 MG 12 hr tablet Take 1 tablet (600 mg total) by mouth 2 (two) times daily for 5 days. 10 tablet Miloh Alcocer, Lurena Joiner, PA-C      PDMP not reviewed this encounter.   Vineet Kinney, Lurena Joiner, [place] 02/20/22 [phone]

## 2022-02-21 DIAGNOSIS — Z419 Encounter for procedure for purposes other than remedying health state, unspecified: Secondary | ICD-10-CM | POA: Diagnosis not present

## 2022-02-24 ENCOUNTER — Encounter: Payer: Self-pay | Admitting: Radiology

## 2022-02-26 ENCOUNTER — Ambulatory Visit (INDEPENDENT_AMBULATORY_CARE_PROVIDER_SITE_OTHER): Payer: Medicaid Other | Admitting: Certified Nurse Midwife

## 2022-02-26 ENCOUNTER — Other Ambulatory Visit: Payer: Self-pay

## 2022-02-26 ENCOUNTER — Ambulatory Visit: Payer: Medicaid Other | Admitting: *Deleted

## 2022-02-26 ENCOUNTER — Ambulatory Visit: Payer: Medicaid Other | Attending: Obstetrics and Gynecology

## 2022-02-26 VITALS — BP 116/59 | HR 104

## 2022-02-26 VITALS — BP 111/71 | HR 108 | Wt 254.2 lb

## 2022-02-26 DIAGNOSIS — O10919 Unspecified pre-existing hypertension complicating pregnancy, unspecified trimester: Secondary | ICD-10-CM

## 2022-02-26 DIAGNOSIS — O285 Abnormal chromosomal and genetic finding on antenatal screening of mother: Secondary | ICD-10-CM

## 2022-02-26 DIAGNOSIS — O0990 Supervision of high risk pregnancy, unspecified, unspecified trimester: Secondary | ICD-10-CM | POA: Diagnosis not present

## 2022-02-26 DIAGNOSIS — E0590 Thyrotoxicosis, unspecified without thyrotoxic crisis or storm: Secondary | ICD-10-CM | POA: Diagnosis not present

## 2022-02-26 DIAGNOSIS — O10913 Unspecified pre-existing hypertension complicating pregnancy, third trimester: Secondary | ICD-10-CM | POA: Diagnosis not present

## 2022-02-26 DIAGNOSIS — O99283 Endocrine, nutritional and metabolic diseases complicating pregnancy, third trimester: Secondary | ICD-10-CM

## 2022-02-26 DIAGNOSIS — Z3A34 34 weeks gestation of pregnancy: Secondary | ICD-10-CM

## 2022-02-26 DIAGNOSIS — O10013 Pre-existing essential hypertension complicating pregnancy, third trimester: Secondary | ICD-10-CM

## 2022-02-26 DIAGNOSIS — D573 Sickle-cell trait: Secondary | ICD-10-CM

## 2022-02-26 DIAGNOSIS — O280 Abnormal hematological finding on antenatal screening of mother: Secondary | ICD-10-CM | POA: Diagnosis not present

## 2022-02-26 DIAGNOSIS — O0993 Supervision of high risk pregnancy, unspecified, third trimester: Secondary | ICD-10-CM

## 2022-02-26 DIAGNOSIS — J302 Other seasonal allergic rhinitis: Secondary | ICD-10-CM

## 2022-02-26 MED ORDER — CETIRIZINE HCL 10 MG PO TABS: 10.0000 mg | ORAL_TABLET | Freq: Every day | ORAL | 6 refills | Status: DC

## 2022-02-26 MED ORDER — FLUTICASONE PROPIONATE 50 MCG/ACT NA SUSP: 2.0000 | Freq: Every day | NASAL | 2 refills | Status: DC

## 2022-02-26 NOTE — Progress Notes (Signed)
PRENATAL VISIT NOTE  Subjective:  Kathleen [name] is a 31 y.o. [id number] at [redacted]w[redacted]d being seen today for ongoing prenatal care.  She is currently monitored for the following issues for this high-risk pregnancy and has Chronic hypertension affecting pregnancy; Asthma affecting pregnancy in third trimester; Sickle cell trait (HCC); Hyperthyroidism affecting pregnancy; Hyperthyroidism; and Supervision of high risk pregnancy, antepartum on their problem list.  Patient reports  runny nose, sneezing and post-nasal drip. Was seen in UC for this and told it was viral, Covid was negative. Has not had a fever and notes symptoms are worst after she's driven around with the windows down (which she does to conserve gas instead of using her AC) .  Contractions: Not present. Vag. Bleeding: None.  Movement: Present. Denies leaking of fluid.   The following portions of the patient's history were reviewed and updated as appropriate: allergies, current medications, past family history, past medical history, past social history, past surgical history and problem list.   Objective:   Vitals:   02/26/22 1144  BP: 111/71  Pulse: (!) 108  Weight: 254 lb 3.2 oz (115.3 kg)   Fetal Status: Fetal Heart Rate (bpm): 135 Fundal Height: 35 cm Movement: Present     General:  Alert, oriented and cooperative. Patient is in no acute distress.  Skin: Skin is warm and dry. No rash noted.   Cardiovascular: Normal heart rate noted  Respiratory: Normal respiratory effort, no problems with respiration noted  Abdomen: Soft, gravid, appropriate for gestational age.  Pain/Pressure: Absent     Pelvic: Cervical exam deferred        Extremities: Normal range of motion.  Edema: None  Mental Status: Normal mood and affect. Normal behavior. Normal judgment and thought content.   Assessment and Plan:  Pregnancy: [id number] at [redacted]w[redacted]d 1. Supervision of high risk pregnancy in third trimester - Doing well, feeling regular and vigorous fetal  movement   2. [redacted] weeks gestation of pregnancy - Routine OB care   3. Chronic hypertension affecting pregnancy - Stable on amlodipine and aspirin - Desires IOL at [redacted]w[redacted]d (10/2) so she can let her job know and plan for childcare - understands this may change if her cHTN does not remain stable. - Will schedule IOL and put in orders  4. Hyperthyroidism affecting pregnancy in third trimester - Normal panel 02/10/22  5. Seasonal allergic rhinitis, unspecified trigger - Prescribed cetirizine and flonase - advised to take 1-2 cetirizine daily and use flonase each evening after rinsing her nose with saline  - Suggested she either use the [facility] or wear a bonnet if windows are down + wash her face and rinse her nose after driving.   Preterm labor symptoms and general obstetric precautions including but not limited to vaginal bleeding, contractions, leaking of fluid and fetal movement were reviewed in detail with the patient. Please refer to After Visit Summary for other counseling recommendations.   Return in about 2 weeks (around 03/12/2022) for IN-PERSON, HOB/GBS.  Future Appointments  Date Time Provider Department Center  03/05/2022  8:30 AM [facility] NURSE [facility]  03/05/2022  8:45 AM [facility] US4 [facility]  03/12/2022  9:30 AM [facility] NURSE [facility]  03/12/2022  9:45 AM [facility] US4 [facility]  03/12/2022 11:15 AM Federico Flake, MD [facility]  03/19/2022 11:15 AM Warden Fillers, MD [facility]  03/26/2022 11:15 AM Venora Maples, MD [facility]  04/02/2022 11:15 AM Venora Maples, MD [facility]  04/09/2022  1:15 PM  Venora Maples, MD [facility]  04/09/2022  2:15 PM [facility] NST [facility]    Bernerd Limbo, CNM

## 2022-03-05 ENCOUNTER — Encounter: Payer: Self-pay | Admitting: *Deleted

## 2022-03-05 ENCOUNTER — Ambulatory Visit: Payer: Medicaid Other | Admitting: *Deleted

## 2022-03-05 ENCOUNTER — Ambulatory Visit: Payer: Medicaid Other | Attending: Obstetrics

## 2022-03-05 ENCOUNTER — Other Ambulatory Visit (HOSPITAL_COMMUNITY): Payer: Self-pay | Admitting: *Deleted

## 2022-03-05 VITALS — BP 117/57 | HR 97

## 2022-03-05 DIAGNOSIS — O0990 Supervision of high risk pregnancy, unspecified, unspecified trimester: Secondary | ICD-10-CM | POA: Insufficient documentation

## 2022-03-05 DIAGNOSIS — O10913 Unspecified pre-existing hypertension complicating pregnancy, third trimester: Secondary | ICD-10-CM | POA: Diagnosis not present

## 2022-03-05 DIAGNOSIS — O10013 Pre-existing essential hypertension complicating pregnancy, third trimester: Secondary | ICD-10-CM

## 2022-03-05 DIAGNOSIS — O285 Abnormal chromosomal and genetic finding on antenatal screening of mother: Secondary | ICD-10-CM

## 2022-03-05 DIAGNOSIS — O99283 Endocrine, nutritional and metabolic diseases complicating pregnancy, third trimester: Secondary | ICD-10-CM | POA: Diagnosis not present

## 2022-03-05 DIAGNOSIS — D573 Sickle-cell trait: Secondary | ICD-10-CM

## 2022-03-05 DIAGNOSIS — O280 Abnormal hematological finding on antenatal screening of mother: Secondary | ICD-10-CM | POA: Diagnosis not present

## 2022-03-05 DIAGNOSIS — E0590 Thyrotoxicosis, unspecified without thyrotoxic crisis or storm: Secondary | ICD-10-CM

## 2022-03-05 DIAGNOSIS — Z3A35 35 weeks gestation of pregnancy: Secondary | ICD-10-CM | POA: Diagnosis not present

## 2022-03-12 ENCOUNTER — Other Ambulatory Visit: Payer: Medicaid Other

## 2022-03-12 ENCOUNTER — Encounter: Payer: Self-pay | Admitting: Family Medicine

## 2022-03-12 ENCOUNTER — Ambulatory Visit (INDEPENDENT_AMBULATORY_CARE_PROVIDER_SITE_OTHER): Payer: Medicaid Other | Admitting: Family Medicine

## 2022-03-12 ENCOUNTER — Other Ambulatory Visit (HOSPITAL_COMMUNITY)
Admission: RE | Admit: 2022-03-12 | Discharge: 2022-03-12 | Disposition: A | Discharge status: Home / Self Care | Payer: Medicaid Other | Source: Ambulatory Visit | Attending: Family Medicine | Admitting: Family Medicine

## 2022-03-12 ENCOUNTER — Ambulatory Visit: Payer: Medicaid Other | Admitting: *Deleted

## 2022-03-12 ENCOUNTER — Ambulatory Visit: Payer: Medicaid Other | Attending: Obstetrics

## 2022-03-12 VITALS — BP 120/67 | HR 97 | Wt 254.9 lb

## 2022-03-12 VITALS — BP 110/61 | HR 78

## 2022-03-12 DIAGNOSIS — O10919 Unspecified pre-existing hypertension complicating pregnancy, unspecified trimester: Secondary | ICD-10-CM

## 2022-03-12 DIAGNOSIS — O99283 Endocrine, nutritional and metabolic diseases complicating pregnancy, third trimester: Secondary | ICD-10-CM

## 2022-03-12 DIAGNOSIS — O0990 Supervision of high risk pregnancy, unspecified, unspecified trimester: Secondary | ICD-10-CM | POA: Insufficient documentation

## 2022-03-12 DIAGNOSIS — O10913 Unspecified pre-existing hypertension complicating pregnancy, third trimester: Secondary | ICD-10-CM | POA: Diagnosis not present

## 2022-03-12 DIAGNOSIS — J45909 Unspecified asthma, uncomplicated: Secondary | ICD-10-CM

## 2022-03-12 DIAGNOSIS — E0590 Thyrotoxicosis, unspecified without thyrotoxic crisis or storm: Secondary | ICD-10-CM | POA: Insufficient documentation

## 2022-03-12 DIAGNOSIS — O280 Abnormal hematological finding on antenatal screening of mother: Secondary | ICD-10-CM | POA: Insufficient documentation

## 2022-03-12 DIAGNOSIS — Z3A36 36 weeks gestation of pregnancy: Secondary | ICD-10-CM | POA: Diagnosis not present

## 2022-03-12 DIAGNOSIS — O10013 Pre-existing essential hypertension complicating pregnancy, third trimester: Secondary | ICD-10-CM | POA: Diagnosis not present

## 2022-03-12 DIAGNOSIS — O285 Abnormal chromosomal and genetic finding on antenatal screening of mother: Secondary | ICD-10-CM

## 2022-03-12 DIAGNOSIS — D573 Sickle-cell trait: Secondary | ICD-10-CM

## 2022-03-12 DIAGNOSIS — Z23 Encounter for immunization: Secondary | ICD-10-CM

## 2022-03-12 DIAGNOSIS — O99513 Diseases of the respiratory system complicating pregnancy, third trimester: Secondary | ICD-10-CM

## 2022-03-12 NOTE — Progress Notes (Signed)
   PRENATAL VISIT NOTE  Subjective:  Kathleen [name] is a 31 y.o. [id number] at [redacted]w[redacted]d being seen today for ongoing prenatal care.  She is currently monitored for the following issues for this high-risk pregnancy and has Chronic hypertension affecting pregnancy; Asthma affecting pregnancy in third trimester; Sickle cell trait ([place]); Hyperthyroidism affecting pregnancy; Hyperthyroidism; and Supervision of high risk pregnancy, antepartum on their problem list.  Patient reports no complaints.  Contractions: Not present. Vag. Bleeding: None.  Movement: Present. Denies leaking of fluid.   The following portions of the patient's history were reviewed and updated as appropriate: allergies, current medications, past family history, past medical history, past social history, past surgical history and problem list.   Objective:   Vitals:   03/12/22 1100  BP: 120/67  Pulse: 97  Weight: 254 lb 14.4 oz (115.6 kg)    Fetal Status: Fetal Heart Rate (bpm): 155   Movement: Present     General:  Alert, oriented and cooperative. Patient is in no acute distress.  Skin: Skin is warm and dry. No rash noted.   Cardiovascular: Normal heart rate noted  Respiratory: Normal respiratory effort, no problems with respiration noted  Abdomen: Soft, gravid, appropriate for gestational age.  Pain/Pressure: Absent     Pelvic: Cervical exam deferred        Extremities: Normal range of motion.  Edema: None  Mental Status: Normal mood and affect. Normal behavior. Normal judgment and thought content.   Assessment and Plan:  Pregnancy: [id number] at [redacted]w[redacted]d  1. Hyperthyroidism affecting pregnancy in third trimester Monitoring levels  - TSH + free T4  2. Supervision of high risk pregnancy, antepartum Vigorous fetal movement No concerns today - Culture, beta strep (group b only) - Cervicovaginal ancillary only( [place])  3. Chronic hypertension affecting pregnancy Stable to amlodipine  On ASA Wants IOL on 10/2-order  in signed and held by Candie Chroman and is on IOL schedule (confirmed today) Continue antenatal testing  4. Asthma affecting pregnancy in third trimester Stable no concerns  Preterm labor symptoms and general obstetric precautions including but not limited to vaginal bleeding, contractions, leaking of fluid and fetal movement were reviewed in detail with the patient. Please refer to After Visit Summary for other counseling recommendations.   Return in about 1 week (around 03/19/2022) for Routine prenatal care.  Future Appointments  Date Time Provider [place]  03/19/2022 10:15 AM [facility] NST [facility]  03/19/2022 11:15 AM Griffin Basil, MD [facility]  03/24/2022  6:30 AM [facility] SCHED ROOM [facility]INDC None  03/26/2022 11:15 AM Clarnce Flock, MD [facility]  04/02/2022 11:15 AM Clarnce Flock, MD [facility]  04/09/2022  1:15 PM Clarnce Flock, MD [facility]  04/09/2022  2:15 PM [facility] NST [facility]    Caren Macadam, MD

## 2022-03-13 ENCOUNTER — Other Ambulatory Visit: Payer: Medicaid Other

## 2022-03-13 ENCOUNTER — Encounter: Payer: Self-pay | Admitting: Obstetrics and Gynecology

## 2022-03-13 LAB — CERVICOVAGINAL ANCILLARY ONLY
Chlamydia: NEGATIVE
Comment: NEGATIVE
Comment: NORMAL
Neisseria Gonorrhea: NEGATIVE

## 2022-03-13 LAB — TSH+FREE T4
Free T4: 0.93 ng/dL (ref 0.82–1.77)
TSH: 0.95 u[IU]/mL (ref 0.450–4.500)

## 2022-03-17 ENCOUNTER — Encounter (HOSPITAL_COMMUNITY): Payer: Self-pay | Admitting: *Deleted

## 2022-03-17 ENCOUNTER — Telehealth (HOSPITAL_COMMUNITY): Payer: Self-pay | Admitting: *Deleted

## 2022-03-17 NOTE — Telephone Encounter (Signed)
Preadmission screen  

## 2022-03-18 DIAGNOSIS — F4310 Post-traumatic stress disorder, unspecified: Secondary | ICD-10-CM | POA: Diagnosis not present

## 2022-03-19 ENCOUNTER — Other Ambulatory Visit: Payer: Self-pay

## 2022-03-19 ENCOUNTER — Other Ambulatory Visit: Payer: Self-pay | Admitting: Advanced Practice Midwife

## 2022-03-19 ENCOUNTER — Ambulatory Visit (INDEPENDENT_AMBULATORY_CARE_PROVIDER_SITE_OTHER): Payer: Medicaid Other | Admitting: Obstetrics and Gynecology

## 2022-03-19 ENCOUNTER — Ambulatory Visit (INDEPENDENT_AMBULATORY_CARE_PROVIDER_SITE_OTHER): Payer: Medicaid Other | Admitting: General Practice

## 2022-03-19 ENCOUNTER — Ambulatory Visit (INDEPENDENT_AMBULATORY_CARE_PROVIDER_SITE_OTHER): Payer: Medicaid Other

## 2022-03-19 VITALS — BP 122/69 | HR 99 | Wt 256.0 lb

## 2022-03-19 DIAGNOSIS — O10919 Unspecified pre-existing hypertension complicating pregnancy, unspecified trimester: Secondary | ICD-10-CM

## 2022-03-19 DIAGNOSIS — E0590 Thyrotoxicosis, unspecified without thyrotoxic crisis or storm: Secondary | ICD-10-CM

## 2022-03-19 DIAGNOSIS — O10913 Unspecified pre-existing hypertension complicating pregnancy, third trimester: Secondary | ICD-10-CM

## 2022-03-19 DIAGNOSIS — O0990 Supervision of high risk pregnancy, unspecified, unspecified trimester: Secondary | ICD-10-CM

## 2022-03-19 DIAGNOSIS — O0993 Supervision of high risk pregnancy, unspecified, third trimester: Secondary | ICD-10-CM

## 2022-03-19 DIAGNOSIS — O99283 Endocrine, nutritional and metabolic diseases complicating pregnancy, third trimester: Secondary | ICD-10-CM

## 2022-03-19 DIAGNOSIS — Z3A37 37 weeks gestation of pregnancy: Secondary | ICD-10-CM

## 2022-03-19 NOTE — Progress Notes (Signed)
Pt informed that the ultrasound is considered a limited OB ultrasound and is not intended to be a complete ultrasound exam.  Patient also informed that the ultrasound is not being completed with the intent of assessing for fetal or placental anomalies or any pelvic abnormalities.  Explained that the purpose of today's ultrasound is to assess for  BPP, presentation, and AFI.  Patient acknowledges the purpose of the exam and the limitations of the study.     Koren Bound RN BSN 03/19/22

## 2022-03-19 NOTE — Progress Notes (Signed)
   PRENATAL VISIT NOTE  Subjective:  Kathleen [name] is a 31 y.o. [id number] at [redacted]w[redacted]d being seen today for ongoing prenatal care.  She is currently monitored for the following issues for this high-risk pregnancy and has Chronic hypertension affecting pregnancy; Asthma affecting pregnancy in third trimester; Sickle cell trait ([place]); Hyperthyroidism affecting pregnancy; Hyperthyroidism; and Supervision of high risk pregnancy, antepartum on their problem list.  Patient doing well with no acute concerns today. She reports no complaints.  Contractions: Not present. Vag. Bleeding: None.  Movement: Present. Denies leaking of fluid.   The following portions of the patient's history were reviewed and updated as appropriate: allergies, current medications, past family history, past medical history, past social history, past surgical history and problem list. Problem list updated.  Objective:   Vitals:   03/19/22 1053  BP: 122/69  Pulse: 99  Weight: 256 lb (116.1 kg)    Fetal Status: Fetal Heart Rate (bpm): NST   Movement: Present     General:  Alert, oriented and cooperative. Patient is in no acute distress.  Skin: Skin is warm and dry. No rash noted.   Cardiovascular: Normal heart rate noted  Respiratory: Normal respiratory effort, no problems with respiration noted  Abdomen: Soft, gravid, appropriate for gestational age.  Pain/Pressure: Present     Pelvic: Cervical exam deferred        Extremities: Normal range of motion.  Edema: None  Mental Status:  Normal mood and affect. Normal behavior. Normal judgment and thought content.   Assessment and Plan:  Pregnancy: [id number] at [redacted]w[redacted]d  1. [redacted] weeks gestation of pregnancy   2. Chronic hypertension affecting pregnancy BP well controlled  3. Hyperthyroidism affecting pregnancy in third trimester Last labs are normal  4. Supervision of high risk pregnancy, antepartum Pt has IOL scheduled  Term labor symptoms and general obstetric precautions  including but not limited to vaginal bleeding, contractions, leaking of fluid and fetal movement were reviewed in detail with the patient.  Please refer to After Visit Summary for other counseling recommendations.   No follow-ups on file.   [name], MD Faculty Attending Center for [facility]

## 2022-03-23 DIAGNOSIS — Z419 Encounter for procedure for purposes other than remedying health state, unspecified: Secondary | ICD-10-CM | POA: Diagnosis not present

## 2022-03-24 ENCOUNTER — Other Ambulatory Visit: Payer: Self-pay

## 2022-03-24 ENCOUNTER — Encounter (HOSPITAL_COMMUNITY): Payer: Medicaid Other

## 2022-03-24 ENCOUNTER — Inpatient Hospital Stay (HOSPITAL_COMMUNITY)
Admission: AD | Admit: 2022-03-24 | Discharge: 2022-03-25 | DRG: 807 | Disposition: A | Discharge status: Home / Self Care | Payer: Medicaid Other | Attending: Family Medicine | Admitting: Family Medicine

## 2022-03-24 ENCOUNTER — Inpatient Hospital Stay (HOSPITAL_COMMUNITY): Payer: Medicaid Other

## 2022-03-24 ENCOUNTER — Encounter (HOSPITAL_COMMUNITY): Payer: Self-pay | Admitting: Family Medicine

## 2022-03-24 DIAGNOSIS — Z87891 Personal history of nicotine dependence: Secondary | ICD-10-CM | POA: Diagnosis not present

## 2022-03-24 DIAGNOSIS — E0590 Thyrotoxicosis, unspecified without thyrotoxic crisis or storm: Secondary | ICD-10-CM | POA: Diagnosis not present

## 2022-03-24 DIAGNOSIS — O9952 Diseases of the respiratory system complicating childbirth: Secondary | ICD-10-CM | POA: Diagnosis present

## 2022-03-24 DIAGNOSIS — D573 Sickle-cell trait: Secondary | ICD-10-CM | POA: Diagnosis present

## 2022-03-24 DIAGNOSIS — O1002 Pre-existing essential hypertension complicating childbirth: Principal | ICD-10-CM | POA: Diagnosis present

## 2022-03-24 DIAGNOSIS — O99284 Endocrine, nutritional and metabolic diseases complicating childbirth: Secondary | ICD-10-CM | POA: Diagnosis present

## 2022-03-24 DIAGNOSIS — Z3A38 38 weeks gestation of pregnancy: Secondary | ICD-10-CM | POA: Diagnosis not present

## 2022-03-24 DIAGNOSIS — O10919 Unspecified pre-existing hypertension complicating pregnancy, unspecified trimester: Secondary | ICD-10-CM | POA: Diagnosis present

## 2022-03-24 DIAGNOSIS — O9902 Anemia complicating childbirth: Secondary | ICD-10-CM | POA: Diagnosis not present

## 2022-03-24 DIAGNOSIS — O0990 Supervision of high risk pregnancy, unspecified, unspecified trimester: Secondary | ICD-10-CM

## 2022-03-24 DIAGNOSIS — J45909 Unspecified asthma, uncomplicated: Secondary | ICD-10-CM | POA: Diagnosis not present

## 2022-03-24 LAB — COMPREHENSIVE METABOLIC PANEL
ALT: 14 U/L (ref 0–44)
AST: 25 U/L (ref 15–41)
Albumin: 3 g/dL — ABNORMAL LOW (ref 3.5–5.0)
Alkaline Phosphatase: 75 U/L (ref 38–126)
Anion gap: 7 (ref 5–15)
BUN: <5 mg/dL — ABNORMAL LOW (ref 6–20)
CO2: 21 mmol/L — ABNORMAL LOW (ref 22–32)
Calcium: 8.4 mg/dL — ABNORMAL LOW (ref 8.9–10.3)
Chloride: 107 mmol/L (ref 98–111)
Creatinine, Ser: 0.73 mg/dL (ref 0.44–1.00)
GFR, Estimated: >60 mL/min (ref >60)
Glucose, Bld: 81 mg/dL (ref 70–99)
Potassium: 3.9 mmol/L (ref 3.5–5.1)
Sodium: 135 mmol/L (ref 135–145)
Total Bilirubin: 0.9 mg/dL (ref 0.3–1.2)
Total Protein: 6.6 g/dL (ref 6.5–8.1)

## 2022-03-24 LAB — CBC
HCT: 36.9 % (ref 36.0–46.0)
Hemoglobin: 12.7 g/dL (ref 12.0–15.0)
MCH: 30.2 pg (ref 26.0–34.0)
MCHC: 34.4 g/dL (ref 30.0–36.0)
MCV: 87.9 fL (ref 80.0–100.0)
Platelets: 186 10*3/uL (ref 150–400)
RBC: 4.2 MIL/uL (ref 3.87–5.11)
RDW: 12.8 % (ref 11.5–15.5)
WBC: 7.8 10*3/uL (ref 4.0–10.5)
nRBC: 0 % (ref 0.0–0.2)

## 2022-03-24 LAB — TYPE AND SCREEN
ABO/RH(D): O POS
Antibody Screen: NEGATIVE

## 2022-03-24 LAB — GROUP B STREP BY PCR: Group B strep by PCR: NEGATIVE

## 2022-03-24 LAB — PROTEIN / CREATININE RATIO, URINE
Creatinine, Urine: 144 mg/dL
Protein Creatinine Ratio: 0.1 mg/mg{Cre} (ref 0.00–0.15)
Total Protein, Urine: 14 mg/dL

## 2022-03-24 LAB — RPR: RPR Ser Ql: NONREACTIVE

## 2022-03-24 MED ORDER — SENNOSIDES-DOCUSATE SODIUM 8.6-50 MG PO TABS
2.0000 | ORAL_TABLET | ORAL | Status: DC
  Administered 2022-03-25: 2 via ORAL
  Filled 2022-03-24: qty 2

## 2022-03-24 MED ORDER — OXYTOCIN-SODIUM CHLORIDE 30-0.9 UT/500ML-% IV SOLN
1.0000 m[IU]/min | INTRAVENOUS | Status: DC
  Administered 2022-03-24: 2 m[IU]/min via INTRAVENOUS

## 2022-03-24 MED ORDER — TERBUTALINE SULFATE 1 MG/ML IJ SOLN: 0.2500 mg | Freq: Once | INTRAMUSCULAR | Status: DC | PRN

## 2022-03-24 MED ORDER — SODIUM CHLORIDE 0.9% FLUSH: 3.0000 mL | Freq: Two times a day (BID) | INTRAVENOUS | Status: DC

## 2022-03-24 MED ORDER — SIMETHICONE 80 MG PO CHEW: 80.0000 mg | CHEWABLE_TABLET | ORAL | Status: DC | PRN

## 2022-03-24 MED ORDER — ONDANSETRON HCL 4 MG/2ML IJ SOLN: 4.0000 mg | INTRAMUSCULAR | Status: DC | PRN

## 2022-03-24 MED ORDER — OXYCODONE-ACETAMINOPHEN 5-325 MG PO TABS: 2.0000 | ORAL_TABLET | ORAL | Status: DC | PRN

## 2022-03-24 MED ORDER — OXYTOCIN-SODIUM CHLORIDE 30-0.9 UT/500ML-% IV SOLN
2.5000 [IU]/h | INTRAVENOUS | Status: DC
  Administered 2022-03-24: 2.5 [IU]/h via INTRAVENOUS
  Filled 2022-03-24: qty 500

## 2022-03-24 MED ORDER — BENZOCAINE-MENTHOL 20-0.5 % EX AERO
1.0000 | INHALATION_SPRAY | CUTANEOUS | Status: DC | PRN
  Administered 2022-03-24: 1 via TOPICAL
  Filled 2022-03-24: qty 56

## 2022-03-24 MED ORDER — PRENATAL MULTIVITAMIN CH
1.0000 | ORAL_TABLET | Freq: Every day | ORAL | Status: DC
  Filled 2022-03-24: qty 1

## 2022-03-24 MED ORDER — ONDANSETRON HCL 4 MG PO TABS: 4.0000 mg | ORAL_TABLET | ORAL | Status: DC | PRN

## 2022-03-24 MED ORDER — DIBUCAINE (PERIANAL) 1 % EX OINT: 1.0000 | TOPICAL_OINTMENT | CUTANEOUS | Status: DC | PRN

## 2022-03-24 MED ORDER — FLUTICASONE FUROATE-VILANTEROL 100-25 MCG/ACT IN AEPB
1.0000 | INHALATION_SPRAY | Freq: Every day | RESPIRATORY_TRACT | Status: DC
  Filled 2022-03-24: qty 28

## 2022-03-24 MED ORDER — WITCH HAZEL-GLYCERIN EX PADS: 1.0000 | MEDICATED_PAD | CUTANEOUS | Status: DC | PRN

## 2022-03-24 MED ORDER — MONTELUKAST SODIUM 10 MG PO TABS
10.0000 mg | ORAL_TABLET | Freq: Every day | ORAL | Status: DC
  Administered 2022-03-24: 10 mg via ORAL
  Filled 2022-03-24 (×3): qty 1

## 2022-03-24 MED ORDER — FENTANYL CITRATE (PF) 100 MCG/2ML IJ SOLN: 50.0000 ug | INTRAMUSCULAR | Status: DC | PRN

## 2022-03-24 MED ORDER — SODIUM CHLORIDE 0.9 % IV SOLN: 250.0000 mL | INTRAVENOUS | Status: DC | PRN

## 2022-03-24 MED ORDER — MISOPROSTOL 25 MCG QUARTER TABLET
25.0000 ug | ORAL_TABLET | Freq: Once | ORAL | Status: AC
  Administered 2022-03-24: 25 ug via VAGINAL
  Filled 2022-03-24: qty 1

## 2022-03-24 MED ORDER — OXYTOCIN BOLUS FROM INFUSION
333.0000 mL | Freq: Once | INTRAVENOUS | Status: AC
  Administered 2022-03-24: 333 mL via INTRAVENOUS

## 2022-03-24 MED ORDER — SODIUM CHLORIDE 0.9% FLUSH: 3.0000 mL | INTRAVENOUS | Status: DC | PRN

## 2022-03-24 MED ORDER — SOD CITRATE-CITRIC ACID 500-334 MG/5ML PO SOLN: 30.0000 mL | ORAL | Status: DC | PRN

## 2022-03-24 MED ORDER — IBUPROFEN 600 MG PO TABS
600.0000 mg | ORAL_TABLET | Freq: Four times a day (QID) | ORAL | Status: DC
  Administered 2022-03-24: 600 mg via ORAL
  Filled 2022-03-24 (×3): qty 1

## 2022-03-24 MED ORDER — ONDANSETRON HCL 4 MG/2ML IJ SOLN: 4.0000 mg | Freq: Four times a day (QID) | INTRAMUSCULAR | Status: DC | PRN

## 2022-03-24 MED ORDER — AMLODIPINE BESYLATE 5 MG PO TABS
5.0000 mg | ORAL_TABLET | Freq: Every day | ORAL | Status: DC
  Administered 2022-03-24 – 2022-03-25 (×2): 5 mg via ORAL
  Filled 2022-03-24 (×2): qty 1

## 2022-03-24 MED ORDER — DIPHENHYDRAMINE HCL 25 MG PO CAPS: 25.0000 mg | ORAL_CAPSULE | Freq: Four times a day (QID) | ORAL | Status: DC | PRN

## 2022-03-24 MED ORDER — OXYCODONE-ACETAMINOPHEN 5-325 MG PO TABS: 1.0000 | ORAL_TABLET | ORAL | Status: DC | PRN

## 2022-03-24 MED ORDER — MISOPROSTOL 50MCG HALF TABLET
50.0000 ug | ORAL_TABLET | Freq: Once | ORAL | Status: AC
  Administered 2022-03-24: 50 ug via ORAL
  Filled 2022-03-24: qty 1

## 2022-03-24 MED ORDER — ACETAMINOPHEN 325 MG PO TABS: 650.0000 mg | ORAL_TABLET | ORAL | Status: DC | PRN

## 2022-03-24 MED ORDER — LIDOCAINE HCL (PF) 1 % IJ SOLN
30.0000 mL | INTRAMUSCULAR | Status: AC | PRN
  Administered 2022-03-24: 30 mL via SUBCUTANEOUS
  Filled 2022-03-24: qty 30

## 2022-03-24 MED ORDER — LACTATED RINGERS IV SOLN
500.0000 mL | INTRAVENOUS | Status: DC | PRN
  Administered 2022-03-24: 1000 mL via INTRAVENOUS

## 2022-03-24 MED ORDER — TETANUS-DIPHTH-ACELL PERTUSSIS 5-2.5-18.5 LF-MCG/0.5 IM SUSY: 0.5000 mL | PREFILLED_SYRINGE | Freq: Once | INTRAMUSCULAR | Status: DC

## 2022-03-24 MED ORDER — COCONUT OIL OIL: 1.0000 | TOPICAL_OIL | Status: DC | PRN

## 2022-03-24 MED ORDER — ZOLPIDEM TARTRATE 5 MG PO TABS: 5.0000 mg | ORAL_TABLET | Freq: Every evening | ORAL | Status: DC | PRN

## 2022-03-24 NOTE — Discharge Summary (Signed)
Postpartum Discharge Summary  Date of Service updated 03/25/22    Patient Name: Kathleen [name] DOB: January 29, 1991 MRN: [id number]  Date of admission: 03/24/2022 Delivery date:03/24/2022  Delivering provider: Concepcion Living  Date of discharge: 03/25/2022  Admitting diagnosis: Chronic hypertension affecting pregnancy [O10.919] Intrauterine pregnancy: [redacted]w[redacted]d    Secondary diagnosis:  Principal Problem:   Vaginal delivery Active Problems:   Chronic hypertension affecting pregnancy   Asthma affecting pregnancy in third trimester   [place]   Hyperthyroidism affecting pregnancy   Hyperthyroidism   Supervision of high risk pregnancy, antepartum  Additional problems: None     Discharge diagnosis: Term Pregnancy Delivered and CHTN                                              Post partum procedures: n/a Augmentation: AROM, Pitocin, Cytotec, and IP Foley Complications: None  Hospital course: Induction of Labor With Vaginal Delivery   [name] admitted to the hospital 03/24/2022 for induction of labor.  Indication for induction:  cHTN .  Patient had an uncomplicated labor course as follows: Membrane Rupture Time/Date: 2:15 PM ,03/24/2022   Delivery Method:Vaginal, Spontaneous  Episiotomy: None  Lacerations:  Labial  Details of delivery can be found in separate delivery note.  Patient had a routine postpartum course. Patient is discharged home 03/25/22.  Newborn Data: Birth date:03/24/2022  Birth time:3:02 PM  Gender:Female  Living status:Living  Apgars:8 ,9  Weight:3550 g   Magnesium Sulfate received: No BMZ received: No Rhophylac:N/A MMR:No T-DaP:Given prenatally Flu: N/A Transfusion:No  Physical exam  Vitals:   03/24/22 1800 03/24/22 2151 03/25/22 0208 03/25/22 0602  BP: 128/68 132/76 (!) 96/59 110/66  Pulse: 97 73 69 73  Resp: '18 18 16 16  ' Temp: 98.8 F (37.1 C) 98.4 F (36.9 C) 97.6 F (36.4 C) 97.8 F (36.6 C)  TempSrc: Oral Oral  Oral Oral  SpO2: 100% 97% 98% 100%  Weight:      Height:       General: alert, cooperative, and no distress Lochia: appropriate Uterine Fundus: firm Incision: N/A DVT Evaluation: Calf/Ankle edema is present Labs: Lab Results  Component Value Date   WBC 7.8 03/24/2022   HGB 12.7 03/24/2022   HCT 36.9 03/24/2022   MCV 87.9 03/24/2022   PLT 186 03/24/2022      Latest Ref Rng & Units 03/24/2022    7:31 AM  CMP  Glucose 70 - 99 mg/dL 81   BUN 6 - 20 mg/dL <5   Creatinine 0.44 - 1.00 mg/dL 0.73   Sodium 135 - 145 mmol/L 135   Potassium 3.5 - 5.1 mmol/L 3.9   Chloride 98 - 111 mmol/L 107   CO2 22 - 32 mmol/L 21   Calcium 8.9 - 10.3 mg/dL 8.4   Total Protein 6.5 - 8.1 g/dL 6.6   Total Bilirubin 0.3 - 1.2 mg/dL 0.9   Alkaline Phos 38 - 126 U/L 75   AST 15 - 41 U/L 25   ALT 0 - 44 U/L 14    Edinburgh Score:    03/25/2022    6:02 AM  Edinburgh Postnatal Depression Scale Screening Tool  I have been able to laugh and see the funny side of things. 0  I have looked forward with enjoyment to things. 0  I have blamed myself unnecessarily when  things went wrong. 0  I have been anxious or worried for no good reason. 0  I have felt scared or panicky for no good reason. 0  Things have been getting on top of me. 0  I have been so unhappy that I have had difficulty sleeping. 0  I have felt sad or miserable. 0  I have been so unhappy that I have been crying. 0  The thought of harming myself has occurred to me. 0  Edinburgh Postnatal Depression Scale Total 0     After visit meds:  Allergies as of 03/25/2022   No Known Allergies      Medication List     STOP taking these medications    aspirin EC 81 MG tablet       TAKE these medications    acetaminophen 325 MG tablet Commonly known as: Tylenol Take 2 tablets (650 mg total) by mouth every 4 (four) hours as needed (for pain scale < 4).   albuterol 108 (90 Base) MCG/ACT inhaler Commonly known as: VENTOLIN HFA Inhale 2  puffs into the lungs every 6 (six) hours as needed for wheezing or shortness of breath.   amLODipine 5 MG tablet Commonly known as: NORVASC Take 1 tablet (5 mg total) by mouth daily.   benzocaine-Menthol 20-0.5 % Aero Commonly known as: DERMOPLAST Apply 1 Application topically as needed for irritation (perineal discomfort).   budesonide-formoterol 80-4.5 MCG/ACT inhaler Commonly known as: Symbicort Inhale 2 puffs into the lungs in the morning and at bedtime.   ibuprofen 600 MG tablet Commonly known as: ADVIL Take 1 tablet (600 mg total) by mouth every 6 (six) hours.   M-Natal Plus 27-1 MG Tabs TAKE 1 TABLET BY MOUTH DAILY AT 12 NOON. What changed: See the new instructions.   montelukast 10 MG tablet Commonly known as: SINGULAIR Take 1 tablet (10 mg total) by mouth at bedtime. What changed: when to take this   senna-docusate 8.6-50 MG tablet Commonly known as: Senokot-S Take 2 tablets by mouth daily.         Discharge home in stable condition Infant Feeding: Bottle and Breast Infant Disposition:home with mother Discharge instruction: per After Visit Summary and Postpartum booklet. Activity: Advance as tolerated. Pelvic rest for 6 weeks.  Diet: routine diet Future Appointments: Future Appointments  Date Time Provider [place]  03/31/2022  3:20 PM [facility] NURSE [facility]  04/24/2022  1:35 PM Griffin Basil, MD [facility]   Follow up Visit:  Message sent 03/24/22  Please schedule this patient for a In person postpartum visit in 4 weeks with the following provider: Any provider. Additional Postpartum F/U:BP check 1 week  High risk pregnancy complicated by: HTN Delivery mode:  Vaginal, Spontaneous  Anticipated Birth Control:  IUD   03/25/2022 Shelda Pal, DO

## 2022-03-24 NOTE — Progress Notes (Signed)
LABOR PROGRESS NOTE  Kathleen [name] is a 31 y.o. [id number] at [redacted]w[redacted]d  admitted for IOL due to [facility].   Subjective: Resting, contractions are getting stronger.    Objective: BP (!) 118/53   Pulse 92   Temp 98.8 F (37.1 C) (Oral)   Resp 16   Ht 5\' 8"  (1.727 m)   Wt 116.6 kg   LMP 06/28/2021   BMI 39.09 kg/m  or  Vitals:   03/24/22 1111 03/24/22 1244 03/24/22 1331 03/24/22 1342  BP: 139/76 (!) 144/66  (!) 118/53  Pulse: (!) 101 100  92  Resp:   16   Temp: 98.8 F (37.1 C)     TempSrc: Oral     Weight:      Height:        Dilation: 5 Effacement (%): 70 Station: -2 Presentation: Vertex Exam by:: Yoseline Andersson, MD FHT: baseline rate 145, moderate  varibility, + acel, nodecel Toco: 2-3 min  Labs: Lab Results  Component Value Date   WBC 7.8 03/24/2022   HGB 12.7 03/24/2022   HCT 36.9 03/24/2022   MCV 87.9 03/24/2022   PLT 186 03/24/2022    Patient Active Problem List   Diagnosis Date Noted   Supervision of high risk pregnancy, antepartum 08/29/2021   Hyperthyroidism 09/28/2020   Hyperthyroidism affecting pregnancy 04/04/2019   Sickle cell trait ([place]) 03/21/2019   Chronic hypertension affecting pregnancy 03/07/2019   Asthma affecting pregnancy in third trimester 03/07/2019    Assessment / Plan: 31 y.o. [id number] at [redacted]w[redacted]d here for IOL for cHTN.  Labor: AROM at this check. Progressing well Fetal Wellbeing:  Cat 1 Pain Control:  Per patients request  Anticipated MOD:  Vaginal  Gifford Shave, MD  03/24/2022, 2:22 PM

## 2022-03-24 NOTE — Progress Notes (Signed)
LABOR PROGRESS NOTE  Kathleen [name] is a 31 y.o. [id number] at [redacted]w[redacted]d  admitted for IOL due to [facility].   Subjective: Doing well feeling contractions.   Objective: BP 139/76 (BP Location: Right Arm)   Pulse (!) 101   Temp 98.8 F (37.1 C) (Oral)   Resp 18   Ht 5\' 8"  (1.727 m)   Wt 116.6 kg   LMP 06/28/2021   BMI 39.09 kg/m  or  Vitals:   03/24/22 0721 03/24/22 0944 03/24/22 1111  BP: 130/71 136/75 139/76  Pulse: (!) 107 97 (!) 101  Resp: 18    Temp: 98 F (36.7 C)  98.8 F (37.1 C)  TempSrc: Oral  Oral  Weight: 116.6 kg    Height: 5\' 8"  (1.727 m)      Dilation: 5 Effacement (%): 70 Station: -2 Presentation: Vertex Exam by:: Dede Dobesh, MD FHT: baseline rate 140, moderate  varibility, + acel, nodecel Toco: 2-3 min  Labs: Lab Results  Component Value Date   WBC 7.8 03/24/2022   HGB 12.7 03/24/2022   HCT 36.9 03/24/2022   MCV 87.9 03/24/2022   PLT 186 03/24/2022    Patient Active Problem List   Diagnosis Date Noted   Supervision of high risk pregnancy, antepartum 08/29/2021   Hyperthyroidism 09/28/2020   Hyperthyroidism affecting pregnancy 04/04/2019   Sickle cell trait ([place]) 03/21/2019   Chronic hypertension affecting pregnancy 03/07/2019   Asthma affecting pregnancy in third trimester 03/07/2019    Assessment / Plan: 31 y.o. [id number] at [redacted]w[redacted]d here for IOL for cHTN.  Labor: Progressing well. FB out at 1200. Will initiate pitocin and recheck in 2 hours. Plan on AROM if able at next check.  Fetal Wellbeing:  Cat 1 Pain Control:  Per patients request  Anticipated MOD:  Vaginal  Gifford Shave, MD  03/24/2022, 12:13 PM

## 2022-03-24 NOTE — H&P (Signed)
Kathleen [name] is a 31 y.o. [phone] female at [redacted]w[redacted]d by LMP c/w 19.0wk scan presenting for IOL due to [facility]. Denies s/s pre-e. Reports active fetal movement, contractions: none, vaginal bleeding: none, membranes: intact.  Initiated prenatal care at [facility] at 11.3 wks.   Most recent u/s : [redacted]w[redacted]d, EFW 46%, AFI 17, post placenta, cephalic.   This pregnancy complicated by: # cHTN (Norvasc 5mg ) # hyperthyroidism (nl TSH & free T4 9/20) # sickle cell trait # asthma  Prenatal History/Complications:  # term SVD 2021 & 2022  Past Medical History: Past Medical History:  Diagnosis Date   Asthma    Hypertension    Hyperthyroidism     Past Surgical History: Past Surgical History:  Procedure Laterality Date   DILATION AND EVACUATION N/A 12/16/2012   Procedure: DILATATION AND EVACUATION;  Surgeon: Marvene Staff, MD;  Location: [facility];  Service: Gynecology;  Laterality: N/A;   WISDOM TOOTH EXTRACTION  2009    Obstetrical History: OB History     Gravida  5   Para  2   Term  2   Preterm  0   AB  2   Living  2      SAB  2   IAB  0   Ectopic  0   Multiple  0   Live Births  2           Social History: Social History   Socioeconomic History   Marital status: Single    Spouse name: Not on file   Number of children: Not on file   Years of education: Not on file   Highest education level: Not on file  Occupational History   Not on file  Tobacco Use   Smoking status: Former   Smokeless tobacco: Never  Vaping Use   Vaping Use: Never used  Substance and Sexual Activity   Alcohol use: No   Drug use: Not Currently    Types: Marijuana    Comment: last smoked over a yr ago   Sexual activity: Yes    Birth control/protection: None  Other Topics Concern   Not on file  Social History Narrative   Not on file   Social Determinants of Health   Financial Resource Strain: Not on file  Food Insecurity: Food Insecurity Present (02/10/2022)   Hunger Vital Sign     Worried About Running Out of Food in the Last Year: Sometimes true    Ran Out of Food in the Last Year: Never true  Transportation Needs: No Transportation Needs (02/10/2022)   PRAPARE - Hydrologist (Medical): No    Lack of Transportation (Non-Medical): No  Physical Activity: Not on file  Stress: Not on file  Social Connections: Not on file    Family History: Family History  Problem Relation Age of Onset   Cancer Mother    Hypertension Mother     Allergies: No Known Allergies  Medications Prior to Admission  Medication Sig Dispense Refill Last Dose   albuterol (VENTOLIN HFA) 108 (90 Base) MCG/ACT inhaler Inhale 2 puffs into the lungs every 6 (six) hours as needed for wheezing or shortness of breath. 8 g 2    amLODipine (NORVASC) 5 MG tablet Take 1 tablet (5 mg total) by mouth daily. 30 tablet 4    aspirin EC 81 MG tablet Take 1 tablet (81 mg total) by mouth daily. Take after 12 weeks for prevention of preeclampsia later in pregnancy 300 tablet 2  budesonide-formoterol (SYMBICORT) 80-4.5 MCG/ACT inhaler Inhale 2 puffs into the lungs in the morning and at bedtime. 1 each 12    montelukast (SINGULAIR) 10 MG tablet Take 1 tablet (10 mg total) by mouth at bedtime. 30 tablet 11    Prenatal Vit-Fe Fumarate-FA (M-NATAL PLUS) 27-1 MG TABS TAKE 1 TABLET BY MOUTH DAILY AT 12 NOON. 30 tablet 11     Review of Systems  Pertinent pos/neg as indicated in HPI  Last menstrual period 06/28/2021, not currently breastfeeding. General appearance: alert, cooperative, and no distress Lungs: clear to auscultation bilaterally Heart: regular rate and rhythm Abdomen: gravid, soft, non-tender, EFW by Leopold's approximately 7lbs Extremities: 1+ edema  Fetal monitoring: FHR: 140s bpm, variability: moderate,  Accelerations: Present,  decelerations:  Absent Uterine activity: rare   Presentation: cephalic   Prenatal labs: ABO, Rh: O/Positive/-- (03/27  1125) Antibody: Negative (03/27 1125) Rubella: 1.51 (03/27 1125) RPR: Non Reactive (07/19 0833)  HBsAg: Negative (03/27 1125)  HIV: Non Reactive (07/19 0833)  GBS:   pending 2hr GTT: 66/87/84  Prenatal Transfer Tool  Maternal Diabetes: No Genetic Screening: Normal Maternal Ultrasounds/Referrals: Normal Fetal Ultrasounds or other Referrals:  None Maternal Substance Abuse:  No Significant Maternal Medications:  Meds include: Other: Norvasc Significant Maternal Lab Results: Other:  GBS PCR pending  No results found for this or any previous visit (from the past 24 hour(s)).   Assessment:  [redacted]w[redacted]d SIUP  [id number]  cHTN  Hyperthyroidism  Cx unfavorable   Cat 1 FHR  GBS  PCR pending  Plan:  -Admit to L&D  -IV pain meds/epidural prn active labor -Cervical ripening with cytotec and/or cervical foley and then progress to Pit/AROM prn -Continue home Norvasc 5mg  and home asthma meds  -Anticipate vag del   -Plans to breastfeed  -Contraception: Liletta outpatient  -Circumcision: yes  CNM 03/24/2022, 7:29 AM

## 2022-03-25 ENCOUNTER — Other Ambulatory Visit (HOSPITAL_COMMUNITY): Payer: Self-pay

## 2022-03-25 MED ORDER — IBUPROFEN 600 MG PO TABS
600.0000 mg | ORAL_TABLET | Freq: Four times a day (QID) | ORAL | 0 refills | Status: DC
  Filled 2022-03-25: qty 30, 8d supply, fill #0

## 2022-03-25 MED ORDER — BENZOCAINE-MENTHOL 20-0.5 % EX AERO
1.0000 | INHALATION_SPRAY | CUTANEOUS | 0 refills | Status: DC | PRN
  Filled 2022-03-25: qty 78, fill #0

## 2022-03-25 MED ORDER — ACETAMINOPHEN 325 MG PO TABS
650.0000 mg | ORAL_TABLET | ORAL | 0 refills | Status: DC | PRN
  Filled 2022-03-25: qty 30, 3d supply, fill #0

## 2022-03-25 MED ORDER — SENNOSIDES-DOCUSATE SODIUM 8.6-50 MG PO TABS
2.0000 | ORAL_TABLET | ORAL | 0 refills | Status: DC
  Filled 2022-03-25: qty 30, 15d supply, fill #0

## 2022-03-25 NOTE — Progress Notes (Signed)
Circumcision Consent   -Circumcision procedure details discussed including usage of local anesthesia, infant soother, and removal and disposal of foreskin. -Risks and benefits of procedure were reviewed including, but not limited to:  *Benefits include reduction in the rates of urinary tract infection (UTI), penile cancer, some sexually transmitted infections, penile inflammatory, and retractile disorders, as well as easier hygiene.   *Risks include bleeding, infection, injury of glans which may lead to need for additional surgery, penile deformity, or urinary tract issues, unsatisfactory cosmetic appearance and other potential complications related to the procedure.   -Informed that procedure will not be performed if provider deems inappropriate d/t penile size, noted deformity, or unsatisfactory pediatric evaluation. -It was emphasized that this is an elective procedure.   -Post circumcision care discussed.   Patient wants to proceed with circumcision. Informed that team would be notified and complete prior to discharge and upon satisfactory pediatric evaluation of infant.    Shelda Pal, [place] Fellow, Faculty practice [place] for [employer] 7:28 AM

## 2022-03-25 NOTE — Lactation Note (Signed)
This note was copied from a baby's chart. Lactation Consultation Note  Patient Name: Kathleen [name] EXNTZ'G Date: 03/25/2022 Reason for consult: Follow-up assessment Age:31 hours  P3, Baby latched with ease.  Encouraged mother to support breast and infant's head. Feed on demand with cues.  Goal 8-12+ times per day after first 24 hrs.  Place baby STS if not cueing.  Reviewed engorgement care and monitoring voids/stools.   Maternal Data Has patient been taught Hand Expression?: Yes Does the patient have breastfeeding experience prior to this delivery?: Yes How long did the patient breastfeed?: 2-3 days  Feeding Mother's Current Feeding Choice: Breast Milk  LATCH Score Latch: Grasps breast easily, tongue down, lips flanged, rhythmical sucking.  Audible Swallowing: Spontaneous and intermittent  Type of Nipple: Everted at rest and after stimulation  Comfort (Breast/Nipple): Soft / non-tender  Hold (Positioning): Assistance needed to correctly position infant at breast and maintain latch.  LATCH Score: 9   Lactation Tools Discussed/Used    Interventions Interventions: Assisted with latch;Skin to skin;Breast feeding basics reviewed  Discharge Discharge Education: Engorgement and breast care;Warning signs for feeding baby  Consult Status Consult Status: Follow-up Date: 03/26/22 Follow-up type: In-patient    [name] [facility] 03/25/2022, 9:31 AM

## 2022-03-25 NOTE — Lactation Note (Signed)
This note was copied from a baby's chart. Lactation Consultation Note  Patient Name: Kathleen [name] EHMCN'O Date: 03/25/2022 Reason for consult: Initial assessment;Early term 37-38.6wks Age:31 hours Mom had baby laying in her lap leaning over baby trying to latch baby to breast. Repositioned mom for comfort, placed pillows in her lap and latched baby. Mom worried about baby not being able to breathe. Unlatched baby and placed in football hold so mom can see baby and not worry about him breathing. Mom liked that feeding position. Hand expression taught w/easily expressed colostrum noted. Praised mom. Mom only BF her other 2 children for a few days.  Newborn feeding habits, behavior, STS, I&O, positioning, support, supply and demand reviewed. Mom encouraged to feed baby 8-12 times/24 hours and with feeding cues.   Placed dry cloth under breast for lift support. Encouraged mom to call for assistance as needed.  Maternal Data Has patient been taught Hand Expression?: Yes Does the patient have breastfeeding experience prior to this delivery?: Yes How long did the patient breastfeed?: 2-3 days  Feeding    LATCH Score Latch: Grasps breast easily, tongue down, lips flanged, rhythmical sucking.  Audible Swallowing: Spontaneous and intermittent  Type of Nipple: Everted at rest and after stimulation  Comfort (Breast/Nipple): Soft / non-tender  Hold (Positioning): Assistance needed to correctly position infant at breast and maintain latch.  LATCH Score: 9   Lactation Tools Discussed/Used    Interventions Interventions: Breast feeding basics reviewed;Adjust position;Assisted with latch;Support pillows;Skin to skin;Position options;Breast massage;Hand express;Breast compression;LC Services brochure  Discharge    Consult Status Consult Status: Follow-up Date: 03/26/22 Follow-up type: In-patient    Theodoro Kalata 03/25/2022, 5:45 AM

## 2022-03-26 ENCOUNTER — Encounter: Payer: Self-pay | Admitting: Family Medicine

## 2022-03-31 ENCOUNTER — Other Ambulatory Visit: Payer: Self-pay

## 2022-03-31 ENCOUNTER — Ambulatory Visit (INDEPENDENT_AMBULATORY_CARE_PROVIDER_SITE_OTHER): Payer: Medicaid Other | Admitting: *Deleted

## 2022-03-31 VITALS — BP 130/83 | HR 77 | Ht 68.0 in | Wt 243.0 lb

## 2022-03-31 DIAGNOSIS — Z0130 Encounter for examination of blood pressure without abnormal findings: Secondary | ICD-10-CM

## 2022-03-31 NOTE — Progress Notes (Signed)
Pt presents for BP check following vaginal delivery on 10/2.  Pt has cHTN and currently takes Amlodipine 5 mg daily.  She denies H/A or visual disturbances. BP - 130/83, P - 87. Pt was instructed to continue Amlodipine as prescribed. She should check BP once per week or if she has severe H/A, blurred vision, dizziness or seeing spots. Pt should contact our office if these issues arise or go to [facility]U for evaluation. She has PP appt on 11/2. Pt had no questions and voiced understanding of all information and instructions given.

## 2022-04-02 ENCOUNTER — Encounter: Payer: Self-pay | Admitting: Family Medicine

## 2022-04-09 ENCOUNTER — Encounter: Payer: Self-pay | Admitting: Family Medicine

## 2022-04-09 ENCOUNTER — Other Ambulatory Visit: Payer: Self-pay

## 2022-04-21 ENCOUNTER — Telehealth: Payer: Self-pay | Admitting: Family Medicine

## 2022-04-21 NOTE — Telephone Encounter (Signed)
Patient called in needing a note sent to her job stating she can return to work with no limitations on 11/1. The fax number is [phone].

## 2022-04-22 NOTE — Telephone Encounter (Signed)
Called patient and confirmed letter to be sent as well as fax number. Told patient I would send that for her now. Patient verbalized understanding.

## 2022-04-23 DIAGNOSIS — Z419 Encounter for procedure for purposes other than remedying health state, unspecified: Secondary | ICD-10-CM | POA: Diagnosis not present

## 2022-04-24 ENCOUNTER — Encounter: Payer: Self-pay | Admitting: Family Medicine

## 2022-04-24 ENCOUNTER — Other Ambulatory Visit (HOSPITAL_COMMUNITY)
Admission: RE | Admit: 2022-04-24 | Discharge: 2022-04-24 | Disposition: A | Discharge status: Home / Self Care | Payer: Medicaid Other | Source: Ambulatory Visit | Attending: Obstetrics and Gynecology | Admitting: Obstetrics and Gynecology

## 2022-04-24 ENCOUNTER — Other Ambulatory Visit: Payer: Self-pay

## 2022-04-24 ENCOUNTER — Encounter: Payer: Self-pay | Admitting: Obstetrics and Gynecology

## 2022-04-24 ENCOUNTER — Ambulatory Visit (INDEPENDENT_AMBULATORY_CARE_PROVIDER_SITE_OTHER): Payer: Medicaid Other | Admitting: Obstetrics and Gynecology

## 2022-04-24 VITALS — BP 117/78 | HR 79 | Ht 68.0 in | Wt 233.9 lb

## 2022-04-24 DIAGNOSIS — Z30430 Encounter for insertion of intrauterine contraceptive device: Secondary | ICD-10-CM | POA: Diagnosis not present

## 2022-04-24 DIAGNOSIS — Z0000 Encounter for general adult medical examination without abnormal findings: Secondary | ICD-10-CM | POA: Insufficient documentation

## 2022-04-24 DIAGNOSIS — Z3040 Encounter for surveillance of contraceptives, unspecified: Secondary | ICD-10-CM

## 2022-04-24 DIAGNOSIS — Z0289 Encounter for other administrative examinations: Secondary | ICD-10-CM

## 2022-04-24 MED ORDER — LEVONORGESTREL 20.1 MCG/DAY IU IUD
1.0000 | INTRAUTERINE_SYSTEM | Freq: Once | INTRAUTERINE | Status: AC
  Administered 2022-04-24: 1 via INTRAUTERINE

## 2022-04-24 NOTE — Progress Notes (Signed)
[place] Partum Visit Note  Kathleen [name] is a 31 y.o. [phone] female who presents for a postpartum visit. She is 4 weeks postpartum following a normal spontaneous vaginal delivery.  I have fully reviewed the prenatal and intrapartum course. The delivery was at 38/3 gestational weeks.  Anesthesia: local. Postpartum course has been uncomplicated. Baby is doing well. Baby is feeding by breast. Bleeding staining only. Bowel function is normal. Bladder function is normal. Patient is not sexually active. Contraception method is none. Postpartum depression screening: negative.   The pregnancy intention screening data noted above was reviewed. Potential methods of contraception were discussed. The patient elected to proceed with progesterone IUD.   Edinburgh Postnatal Depression Scale - 04/24/22 1339       Edinburgh Postnatal Depression Scale:  In the Past 7 Days   I have been able to laugh and see the funny side of things. 0    I have looked forward with enjoyment to things. 0    I have blamed myself unnecessarily when things went wrong. 0    I have been anxious or worried for no good reason. 0    I have felt scared or panicky for no good reason. 0    Things have been getting on top of me. 0    I have been so unhappy that I have had difficulty sleeping. 0    I have felt sad or miserable. 0    I have been so unhappy that I have been crying. 0    The thought of harming myself has occurred to me. 0    Edinburgh Postnatal Depression Scale Total 0             Health Maintenance Due  Topic Date Due   COVID-19 Vaccine (1) Never done   PAP SMEAR-Modifier  02/13/2022    The following portions of the patient's history were reviewed and updated as appropriate: allergies, current medications, past family history, past medical history, past social history, past surgical history, and problem list.  Review of Systems Pertinent items are noted in HPI.  Objective:  BP 117/78   Pulse 79   Ht 5'  8" (1.727 m)   Wt 233 lb 14.4 oz (106.1 kg)   LMP 06/28/2021   Breastfeeding Yes   BMI 35.56 kg/m    General:  alert, cooperative, and no distress   Breasts:  not indicated  Lungs: clear to auscultation bilaterally  Heart:  regular rate and rhythm  Abdomen: soft, non-tender; bowel sounds normal; no masses,  no organomegaly   Wound N/a  GU exam:  normal       Assessment:   Encounter for postpartum care  normal postpartum exam.   Plan:   Essential components of care per ACOG recommendations:  1.  Mood and well being: Patient with negative depression screening today. Reviewed local resources for support.  - Patient tobacco use? No.   - hx of drug use? No.    2. Infant care and feeding:  -Patient currently breastmilk feeding? Yes. Reviewed importance of draining breast regularly to support lactation.  -Social determinants of health (SDOH) reviewed in EPIC. No concerns, discussed food insecurity, but pt states that is not an issue  3. Sexuality, contraception and birth spacing - Patient does not want a pregnancy in the next year.  Desired family size is 3 children.  - Reviewed reproductive life planning. Reviewed contraceptive methods based on pt preferences and effectiveness.  Patient desired IUD or IUS today.   -  Discussed birth spacing of 18 months  4. Sleep and fatigue -Encouraged family/partner/community support of 4 hrs of uninterrupted sleep to help with mood and fatigue  5. Physical Recovery  - Discussed patients delivery and complications. She describes her labor as good. - Patient had a Vaginal, no problems at delivery. Patient had a  bilateral labial  laceration. Perineal healing reviewed. Patient expressed understanding - Patient has urinary incontinence? No. - Patient is safe to resume physical and sexual activity  6.  Health Maintenance - HM due items addressed Yes - Last pap smear No results found for: "DIAGPAP" Pap smear done at today's visit.  -Breast  Cancer screening indicated? No.   7. Chronic Disease/Pregnancy Condition follow up: Hypertension Pt will follow up with her PCP in 1-2 months - PCP follow up  Warden Fillers, MD Center for [facility], [facility]

## 2022-04-24 NOTE — Progress Notes (Signed)
    GYNECOLOGY OFFICE PROCEDURE NOTE  Kathleen [name] is a 31 y.o. [phone] here for [place] IUD insertion. No GYN concerns.  Last pap smear was on 02/14/19 and was normal.  IUD Insertion Procedure Note Patient identified, informed consent performed, consent signed.   Discussed risks of irregular bleeding, cramping, infection, malpositioning or misplacement of the IUD outside the uterus which may require further procedure such as laparoscopy. Also discussed >99% contraception efficacy, increased risk of ectopic pregnancy with failure of method.  Time out was performed.  Urine pregnancy test negative.  Speculum placed in the vagina.  Cervix visualized.  Cleaned with Betadine x 2.  Grasped anteriorly with a single tooth tenaculum.  Uterus sounded to 9 cm.  Liletta IUD placed per manufacturer's recommendations.  Strings trimmed to 3 cm. Tenaculum was removed, good hemostasis noted.  Patient tolerated procedure well.   Patient was given post-procedure instructions.  She was advised to have backup contraception for one week.  Patient was also asked to check IUD strings periodically and follow up in 4 weeks for IUD check.   [name], MD, [place] for [facility], [place]

## 2022-04-28 LAB — CYTOLOGY - PAP
Chlamydia: NEGATIVE
Comment: NEGATIVE
Comment: NEGATIVE
Comment: NORMAL
Diagnosis: NEGATIVE
High risk HPV: NEGATIVE
Neisseria Gonorrhea: NEGATIVE

## 2022-05-06 ENCOUNTER — Encounter: Payer: Self-pay | Admitting: Family Medicine

## 2022-05-06 ENCOUNTER — Other Ambulatory Visit: Payer: Self-pay | Admitting: Obstetrics & Gynecology

## 2022-05-06 ENCOUNTER — Other Ambulatory Visit: Payer: Self-pay | Admitting: Obstetrics and Gynecology

## 2022-05-06 DIAGNOSIS — J4540 Moderate persistent asthma, uncomplicated: Secondary | ICD-10-CM

## 2022-05-06 DIAGNOSIS — O10919 Unspecified pre-existing hypertension complicating pregnancy, unspecified trimester: Secondary | ICD-10-CM

## 2022-05-07 ENCOUNTER — Other Ambulatory Visit: Payer: Self-pay

## 2022-05-07 DIAGNOSIS — J4540 Moderate persistent asthma, uncomplicated: Secondary | ICD-10-CM

## 2022-05-07 MED ORDER — ALBUTEROL SULFATE HFA 108 (90 BASE) MCG/ACT IN AERS: 2.0000 | INHALATION_SPRAY | Freq: Four times a day (QID) | RESPIRATORY_TRACT | 0 refills | Status: AC | PRN

## 2022-05-22 ENCOUNTER — Other Ambulatory Visit: Payer: Self-pay

## 2022-05-22 ENCOUNTER — Ambulatory Visit (INDEPENDENT_AMBULATORY_CARE_PROVIDER_SITE_OTHER): Payer: Medicaid Other | Admitting: Obstetrics and Gynecology

## 2022-05-22 ENCOUNTER — Other Ambulatory Visit (HOSPITAL_COMMUNITY)
Admission: RE | Admit: 2022-05-22 | Discharge: 2022-05-22 | Disposition: A | Discharge status: Home / Self Care | Payer: Medicaid Other | Source: Ambulatory Visit | Attending: Obstetrics and Gynecology | Admitting: Obstetrics and Gynecology

## 2022-05-22 VITALS — BP 118/76 | HR 74

## 2022-05-22 DIAGNOSIS — N898 Other specified noninflammatory disorders of vagina: Secondary | ICD-10-CM | POA: Diagnosis not present

## 2022-05-22 DIAGNOSIS — Z975 Presence of (intrauterine) contraceptive device: Secondary | ICD-10-CM | POA: Diagnosis not present

## 2022-05-22 NOTE — Progress Notes (Signed)
    GYNECOLOGY VISIT  Patient name: Jacquetta Polhamus MRN [id number]  Date of birth: 1991-01-26 Chief Complaint:   IUD String Check  History:  Alylah Blakney is a 31 y.o. [phone] being seen today for IUD string check.  Also noted to have vulvar itching and dryness. Will find that she has been scratching overnight. Vaginal discharge present, similar to yeas infection in the past. Not yet sexually active. Has not checked her strings.   Past Medical History:  Diagnosis Date   Asthma    Hypertension    Hyperthyroidism     Past Surgical History:  Procedure Laterality Date   DILATION AND EVACUATION N/A 12/16/2012   Procedure: DILATATION AND EVACUATION;  Surgeon: Serita Kyle, MD;  Location: [facility];  Service: Gynecology;  Laterality: N/A;   WISDOM TOOTH EXTRACTION  2009    The following portions of the patient's history were reviewed and updated as appropriate: allergies, current medications, past family history, past medical history, past social history, past surgical history and problem list.    Review of Systems:  Pertinent items are noted in HPI. Comprehensive review of systems was otherwise negative.   Objective:  Physical Exam BP 118/76   Pulse 74   LMP 06/28/2021    Physical Exam Vitals and nursing note reviewed. Exam conducted with a chaperone present.  Constitutional:      Appearance: Normal appearance.  HENT:     Head: Normocephalic and atraumatic.  Cardiovascular:     Rate and Rhythm: Normal rate and regular rhythm.  Pulmonary:     Effort: Pulmonary effort is normal.     Breath sounds: Normal breath sounds.  Genitourinary:    Exam position: Lithotomy position.     Vagina: Normal.     Cervix: Discharge present.     Comments: Vulva mildly erythemasout throughout lower half  Small volume dsicharge IUD strings visualized  Skin:    General: Skin is warm and dry.  Neurological:     General: No focal deficit present.     Mental Status: She is alert.   Psychiatric:        Mood and Affect: Mood normal.        Behavior: Behavior normal.        Thought Content: Thought content normal.        Judgment: Judgment normal.  Labs and Imaging No results found.     Assessment & Plan:   1. Vaginal irritation Given itching and discharge, vaginitis swab collected and provided information for vulvar care. If swabs negative, will proceed with vaginal moisturizer or estrogen to help with vaginal dryness.  - Cervicovaginal ancillary only( [place])  2. IUD (intrauterine device) in place IUD in place. Reviewed good for 8 years for contraception.      Routine preventative health maintenance measures emphasized.  Lorriane Shire, MD [facility] for [facility], [facility]

## 2022-05-22 NOTE — Patient Instructions (Signed)
Healthy vulval hygiene practices Avoid Substitute  Clothing  Pantyhose Thigh-high or knee-high stockings Stay-up thigh-high stockings with silicone adhesive strips Stockings with a garter belt  Synthetic underwear Cotton underwear or no underwear  Jeans and other tight pants Loose pants, skirts, dresses  Swimsuits, leotards, thongs, Lycra garments for prolonged periods Loose-fitting cotton garments or prompt removal of swimsuits, leotards, etc, after completion of exercise activity  Cleansing products  Scented soaps or shampoos Fragrance-free, pH-neutral, soap-free cleanser or bland emollient moisturizer  Bubble bath Warm (not hot) plain water bath once daily. A bland, unfragranced, pH-neutral emollient moisturizer can be used as a bath additive.  Scented detergents Unscented detergents  Baby wipes or flushable wipes Rinse with water using sports water bottle or perineal irrigation bottle  Feminine sprays, douches, powders These are not necessary products and can be omitted from personal practices  Other  Washcloths Use fingertips for washing; pat dry, do not rub dry  Panty liners Tampons, cotton pads, washable cotton-based "period underwear," menstrual cups  Dyed toilet articles Toilet articles without dyes  Hair dryers to dry vulva skin without contact Dry vulva by gentle patting with a soft towel  Graphic 64538 Version 8.0  2023 Up[employer]. and/or its affiliates. All Rights Reserved.  

## 2022-05-23 DIAGNOSIS — Z419 Encounter for procedure for purposes other than remedying health state, unspecified: Secondary | ICD-10-CM | POA: Diagnosis not present

## 2022-05-23 LAB — CERVICOVAGINAL ANCILLARY ONLY
Bacterial Vaginitis (gardnerella): NEGATIVE
Candida Glabrata: NEGATIVE
Candida Vaginitis: POSITIVE — AB
Comment: NEGATIVE
Comment: NEGATIVE
Comment: NEGATIVE

## 2022-05-26 ENCOUNTER — Other Ambulatory Visit: Payer: Self-pay | Admitting: Obstetrics and Gynecology

## 2022-05-26 DIAGNOSIS — N760 Acute vaginitis: Secondary | ICD-10-CM

## 2022-05-26 MED ORDER — FLUCONAZOLE 150 MG PO TABS: 150.0000 mg | ORAL_TABLET | Freq: Once | ORAL | 0 refills | Status: AC

## 2022-06-23 DIAGNOSIS — Z419 Encounter for procedure for purposes other than remedying health state, unspecified: Secondary | ICD-10-CM | POA: Diagnosis not present

## 2022-07-24 DIAGNOSIS — Z419 Encounter for procedure for purposes other than remedying health state, unspecified: Secondary | ICD-10-CM | POA: Diagnosis not present

## 2022-08-21 ENCOUNTER — Other Ambulatory Visit: Payer: Self-pay | Admitting: General Practice

## 2022-08-22 DIAGNOSIS — Z419 Encounter for procedure for purposes other than remedying health state, unspecified: Secondary | ICD-10-CM | POA: Diagnosis not present

## 2022-08-22 MED ORDER — FLUTICASONE PROPIONATE 50 MCG/ACT NA SUSP: 2.0000 | Freq: Every day | NASAL | 2 refills | Status: AC

## 2022-09-15 ENCOUNTER — Other Ambulatory Visit: Payer: Self-pay | Admitting: Obstetrics and Gynecology

## 2022-09-15 DIAGNOSIS — O10919 Unspecified pre-existing hypertension complicating pregnancy, unspecified trimester: Secondary | ICD-10-CM

## 2022-09-22 DIAGNOSIS — Z419 Encounter for procedure for purposes other than remedying health state, unspecified: Secondary | ICD-10-CM | POA: Diagnosis not present

## 2022-10-17 ENCOUNTER — Telehealth: Payer: Self-pay

## 2022-10-17 NOTE — Telephone Encounter (Signed)
LVM for patient to call back to schedule apt. AS, CMA 

## 2022-10-22 DIAGNOSIS — Z419 Encounter for procedure for purposes other than remedying health state, unspecified: Secondary | ICD-10-CM | POA: Diagnosis not present

## 2022-11-14 DIAGNOSIS — I10 Essential (primary) hypertension: Secondary | ICD-10-CM | POA: Diagnosis not present

## 2022-11-14 DIAGNOSIS — E669 Obesity, unspecified: Secondary | ICD-10-CM | POA: Diagnosis not present

## 2022-11-14 DIAGNOSIS — Z6833 Body mass index (BMI) 33.0-33.9, adult: Secondary | ICD-10-CM | POA: Diagnosis not present

## 2022-11-14 DIAGNOSIS — J45909 Unspecified asthma, uncomplicated: Secondary | ICD-10-CM | POA: Diagnosis not present

## 2022-11-22 DIAGNOSIS — Z419 Encounter for procedure for purposes other than remedying health state, unspecified: Secondary | ICD-10-CM | POA: Diagnosis not present

## 2022-12-22 DIAGNOSIS — Z419 Encounter for procedure for purposes other than remedying health state, unspecified: Secondary | ICD-10-CM | POA: Diagnosis not present

## 2023-01-22 DIAGNOSIS — Z419 Encounter for procedure for purposes other than remedying health state, unspecified: Secondary | ICD-10-CM | POA: Diagnosis not present

## 2023-02-22 DIAGNOSIS — Z419 Encounter for procedure for purposes other than remedying health state, unspecified: Secondary | ICD-10-CM | POA: Diagnosis not present

## 2023-03-17 DIAGNOSIS — E785 Hyperlipidemia, unspecified: Secondary | ICD-10-CM | POA: Diagnosis not present

## 2023-03-17 DIAGNOSIS — I10 Essential (primary) hypertension: Secondary | ICD-10-CM | POA: Diagnosis not present

## 2023-03-17 DIAGNOSIS — J45909 Unspecified asthma, uncomplicated: Secondary | ICD-10-CM | POA: Diagnosis not present

## 2023-03-24 DIAGNOSIS — Z419 Encounter for procedure for purposes other than remedying health state, unspecified: Secondary | ICD-10-CM | POA: Diagnosis not present

## 2023-04-24 DIAGNOSIS — Z419 Encounter for procedure for purposes other than remedying health state, unspecified: Secondary | ICD-10-CM | POA: Diagnosis not present

## 2023-05-24 DIAGNOSIS — Z419 Encounter for procedure for purposes other than remedying health state, unspecified: Secondary | ICD-10-CM | POA: Diagnosis not present

## 2023-06-24 DIAGNOSIS — Z419 Encounter for procedure for purposes other than remedying health state, unspecified: Secondary | ICD-10-CM | POA: Diagnosis not present

## 2023-07-25 DIAGNOSIS — Z419 Encounter for procedure for purposes other than remedying health state, unspecified: Secondary | ICD-10-CM | POA: Diagnosis not present

## 2023-08-22 DIAGNOSIS — Z419 Encounter for procedure for purposes other than remedying health state, unspecified: Secondary | ICD-10-CM | POA: Diagnosis not present

## 2023-10-03 DIAGNOSIS — Z419 Encounter for procedure for purposes other than remedying health state, unspecified: Secondary | ICD-10-CM | POA: Diagnosis not present

## 2023-10-08 DIAGNOSIS — R002 Palpitations: Secondary | ICD-10-CM | POA: Diagnosis not present

## 2023-10-08 DIAGNOSIS — Z1329 Encounter for screening for other suspected endocrine disorder: Secondary | ICD-10-CM | POA: Diagnosis not present

## 2023-10-08 DIAGNOSIS — Z131 Encounter for screening for diabetes mellitus: Secondary | ICD-10-CM | POA: Diagnosis not present

## 2023-10-08 DIAGNOSIS — J45909 Unspecified asthma, uncomplicated: Secondary | ICD-10-CM | POA: Diagnosis not present

## 2023-10-08 DIAGNOSIS — I10 Essential (primary) hypertension: Secondary | ICD-10-CM | POA: Diagnosis not present

## 2023-10-08 DIAGNOSIS — E785 Hyperlipidemia, unspecified: Secondary | ICD-10-CM | POA: Diagnosis not present

## 2023-11-02 DIAGNOSIS — Z419 Encounter for procedure for purposes other than remedying health state, unspecified: Secondary | ICD-10-CM | POA: Diagnosis not present

## 2023-12-03 DIAGNOSIS — Z419 Encounter for procedure for purposes other than remedying health state, unspecified: Secondary | ICD-10-CM | POA: Diagnosis not present

## 2024-01-02 DIAGNOSIS — Z419 Encounter for procedure for purposes other than remedying health state, unspecified: Secondary | ICD-10-CM | POA: Diagnosis not present

## 2024-02-02 DIAGNOSIS — Z419 Encounter for procedure for purposes other than remedying health state, unspecified: Secondary | ICD-10-CM | POA: Diagnosis not present

## 2024-03-04 DIAGNOSIS — Z419 Encounter for procedure for purposes other than remedying health state, unspecified: Secondary | ICD-10-CM | POA: Diagnosis not present

## 2024-04-20 DIAGNOSIS — Z13220 Encounter for screening for lipoid disorders: Secondary | ICD-10-CM | POA: Diagnosis not present

## 2024-04-20 DIAGNOSIS — Z113 Encounter for screening for infections with a predominantly sexual mode of transmission: Secondary | ICD-10-CM | POA: Diagnosis not present

## 2024-04-20 DIAGNOSIS — Z1329 Encounter for screening for other suspected endocrine disorder: Secondary | ICD-10-CM | POA: Diagnosis not present

## 2024-04-20 DIAGNOSIS — Z130 Encounter for screening for diseases of the blood and blood-forming organs and certain disorders involving the immune mechanism: Secondary | ICD-10-CM | POA: Diagnosis not present

## 2024-04-22 ENCOUNTER — Emergency Department (HOSPITAL_COMMUNITY)

## 2024-04-22 ENCOUNTER — Ambulatory Visit (HOSPITAL_COMMUNITY): Admission: EM | Admit: 2024-04-22 | Discharge: 2024-04-22 | Disposition: A | Discharge status: Home / Self Care

## 2024-04-22 ENCOUNTER — Other Ambulatory Visit: Payer: Self-pay

## 2024-04-22 ENCOUNTER — Encounter (HOSPITAL_COMMUNITY): Payer: Self-pay | Admitting: Emergency Medicine

## 2024-04-22 ENCOUNTER — Emergency Department (HOSPITAL_COMMUNITY)
Admission: EM | Admit: 2024-04-22 | Discharge: 2024-04-22 | Disposition: A | Discharge status: Home / Self Care | Attending: Emergency Medicine | Admitting: Emergency Medicine

## 2024-04-22 ENCOUNTER — Encounter (HOSPITAL_COMMUNITY): Payer: Self-pay

## 2024-04-22 DIAGNOSIS — I10 Essential (primary) hypertension: Secondary | ICD-10-CM | POA: Insufficient documentation

## 2024-04-22 DIAGNOSIS — R112 Nausea with vomiting, unspecified: Secondary | ICD-10-CM | POA: Insufficient documentation

## 2024-04-22 DIAGNOSIS — E0590 Thyrotoxicosis, unspecified without thyrotoxic crisis or storm: Secondary | ICD-10-CM

## 2024-04-22 DIAGNOSIS — R519 Headache, unspecified: Secondary | ICD-10-CM | POA: Diagnosis not present

## 2024-04-22 DIAGNOSIS — R079 Chest pain, unspecified: Secondary | ICD-10-CM | POA: Diagnosis not present

## 2024-04-22 DIAGNOSIS — J45909 Unspecified asthma, uncomplicated: Secondary | ICD-10-CM | POA: Diagnosis not present

## 2024-04-22 DIAGNOSIS — R42 Dizziness and giddiness: Secondary | ICD-10-CM | POA: Diagnosis not present

## 2024-04-22 DIAGNOSIS — R000 Tachycardia, unspecified: Secondary | ICD-10-CM | POA: Diagnosis not present

## 2024-04-22 LAB — CBC WITH DIFFERENTIAL/PLATELET
Abs Immature Granulocytes: 0.01 K/uL (ref 0.00–0.07)
Basophils Absolute: 0 K/uL (ref 0.0–0.1)
Basophils Relative: 1 %
Eosinophils Absolute: 0.1 K/uL (ref 0.0–0.5)
Eosinophils Relative: 1 %
HCT: 44.6 % (ref 36.0–46.0)
Hemoglobin: 15.4 g/dL — ABNORMAL HIGH (ref 12.0–15.0)
Immature Granulocytes: 0 %
Lymphocytes Relative: 45 %
Lymphs Abs: 1.9 K/uL (ref 0.7–4.0)
MCH: 29.1 pg (ref 26.0–34.0)
MCHC: 34.5 g/dL (ref 30.0–36.0)
MCV: 84.2 fL (ref 80.0–100.0)
Monocytes Absolute: 0.3 K/uL (ref 0.1–1.0)
Monocytes Relative: 6 %
Neutro Abs: 1.9 K/uL (ref 1.7–7.7)
Neutrophils Relative %: 47 %
Platelets: 260 K/uL (ref 150–400)
RBC: 5.3 MIL/uL — ABNORMAL HIGH (ref 3.87–5.11)
RDW: 11.6 % (ref 11.5–15.5)
WBC: 4.2 K/uL (ref 4.0–10.5)
nRBC: 0 % (ref 0.0–0.2)

## 2024-04-22 LAB — T4, FREE: Free T4: 1.3 ng/dL — ABNORMAL HIGH (ref 0.61–1.12)

## 2024-04-22 LAB — COMPREHENSIVE METABOLIC PANEL WITH GFR
ALT: 17 U/L (ref 0–44)
AST: 21 U/L (ref 15–41)
Albumin: 4.2 g/dL (ref 3.5–5.0)
Alkaline Phosphatase: 52 U/L (ref 38–126)
Anion gap: 14 (ref 5–15)
BUN: 8 mg/dL (ref 6–20)
CO2: 22 mmol/L (ref 22–32)
Calcium: 9.7 mg/dL (ref 8.9–10.3)
Chloride: 101 mmol/L (ref 98–111)
Creatinine, Ser: 0.76 mg/dL (ref 0.44–1.00)
GFR, Estimated: >60 mL/min (ref >60)
Glucose, Bld: 83 mg/dL (ref 70–99)
Potassium: 3.8 mmol/L (ref 3.5–5.1)
Sodium: 137 mmol/L (ref 135–145)
Total Bilirubin: 1.5 mg/dL — ABNORMAL HIGH (ref 0.0–1.2)
Total Protein: 8.2 g/dL — ABNORMAL HIGH (ref 6.5–8.1)

## 2024-04-22 LAB — TSH: TSH: <0.1 u[IU]/mL — ABNORMAL LOW (ref 0.350–4.500)

## 2024-04-22 LAB — TROPONIN I (HIGH SENSITIVITY): Troponin I (High Sensitivity): 3 ng/L (ref <18)

## 2024-04-22 LAB — CK: Total CK: 135 U/L (ref 38–234)

## 2024-04-22 LAB — HCG, SERUM, QUALITATIVE: Preg, Serum: NEGATIVE

## 2024-04-22 MED ORDER — ONDANSETRON HCL 4 MG PO TABS: 4.0000 mg | ORAL_TABLET | Freq: Three times a day (TID) | ORAL | 0 refills | Status: AC | PRN

## 2024-04-22 NOTE — ED Provider Notes (Signed)
 [facility]    CSN: [id number] Arrival date & time: 04/22/24  1021      History   Chief Complaint Chief Complaint  Patient presents with   high thyroid  levels    HPI Kathleen [name] is a 33 y.o. female presenting with hypothyroidism.  She was seen by her primary care in the last day, and they advised her to go to the emergency department for thyrotoxicosis, per care everywhere. She is feeling well at the time of this visit.  HPI  Past Medical History:  Diagnosis Date   Asthma    Hypertension    Hyperthyroidism     Patient Active Problem List   Diagnosis Date Noted   Vaginal delivery 03/24/2022   Hyperthyroidism 09/28/2020   Hyperthyroidism affecting pregnancy 04/04/2019   Sickle cell trait 03/21/2019   Chronic hypertension affecting pregnancy 03/07/2019   Asthma affecting pregnancy in third trimester 03/07/2019    Past Surgical History:  Procedure Laterality Date   DILATION AND EVACUATION N/A 12/16/2012   Procedure: DILATATION AND EVACUATION;  Surgeon: Dickie DELENA Carder, MD;  Location: [facility];  Service: Gynecology;  Laterality: N/A;   WISDOM TOOTH EXTRACTION  2009    OB History     Gravida  5   Para  3   Term  3   Preterm  0   AB  2   Living  3      SAB  2   IAB  0   Ectopic  0   Multiple  0   Live Births  3            Home Medications    Prior to Admission medications   Medication Sig Start Date End Date Taking? Authorizing Provider  albuterol  (VENTOLIN  HFA) 108 (90 Base) MCG/ACT inhaler Inhale 2 puffs into the lungs every 6 (six) hours as needed for wheezing or shortness of breath. 05/07/22   Ajewole, Christana, MD  amLODipine  (NORVASC ) 5 MG tablet TAKE 1 TABLET (5 MG TOTAL) BY MOUTH DAILY. 09/19/22   Zina Jerilynn DELENA, MD  fluticasone  (FLONASE ) 50 MCG/ACT nasal spray Place 2 sprays into both nostrils daily. 08/22/22 09/21/22  Vannie Cornell SAUNDERS, CNM  montelukast  (SINGULAIR ) 10 MG tablet Take 1 tablet (10 mg total) by mouth  at bedtime. Patient taking differently: Take 10 mg by mouth daily. 02/10/22   Zina Jerilynn DELENA, MD  Prenatal Vit-Fe Fumarate-FA (M-NATAL PLUS) 27-1 MG TABS Take 1 tablet by mouth daily. 09/19/22   Zina Jerilynn DELENA, MD    Family History Family History  Problem Relation Age of Onset   Cancer Mother    Hypertension Mother     Social History Social History   Tobacco Use   Smoking status: Former   Smokeless tobacco: Never  [employer]   Vaping status: Never Used  Substance Use Topics   Alcohol use: No   Drug use: Not Currently    Types: Marijuana    Comment: last smoked over a yr ago     Allergies   Patient has no known allergies.   Review of Systems Review of Systems  Constitutional:  Negative for chills and fever.  HENT:  Negative for ear pain and sore throat.   Eyes:  Negative for pain and visual disturbance.  Respiratory:  Negative for cough and shortness of breath.   Cardiovascular:  Negative for chest pain and palpitations.  Gastrointestinal:  Negative for abdominal pain and vomiting.  Genitourinary:  Negative for dysuria and hematuria.  Musculoskeletal:  Negative for arthralgias and back pain.  Skin:  Negative for color change and rash.  Neurological:  Negative for seizures and syncope.  All other systems reviewed and are negative.    Physical Exam Triage Vital Signs ED Triage Vitals  Encounter Vitals Group     BP 04/22/24 1122 118/82     Girls Systolic BP Percentile --      Girls Diastolic BP Percentile --      Boys Systolic BP Percentile --      Boys Diastolic BP Percentile --      Pulse Rate 04/22/24 1122 89     Resp 04/22/24 1122 16     Temp 04/22/24 1122 98.4 F (36.9 C)     Temp Source 04/22/24 1122 Oral     SpO2 04/22/24 1122 97 %     Weight --      Height --      Head Circumference --      Peak Flow --      Pain Score 04/22/24 1121 0     Pain Loc --      Pain Education --      Exclude from Growth Chart --    No data found.  Updated  Vital Signs BP 118/82 (BP Location: Right Arm)   Pulse 89   Temp 98.4 F (36.9 C) (Oral)   Resp 16   LMP 04/15/2024 (Exact Date)   SpO2 97%   Breastfeeding No   Visual Acuity Right Eye Distance:   Left Eye Distance:   Bilateral Distance:    Right Eye Near:   Left Eye Near:    Bilateral Near:     Physical Exam Vitals reviewed.  Constitutional:      General: She is not in acute distress.    Appearance: Normal appearance. She is not ill-appearing or diaphoretic.  HENT:     Head: Normocephalic and atraumatic.  Cardiovascular:     Rate and Rhythm: Normal rate and regular rhythm.     Heart sounds: Normal heart sounds.  Pulmonary:     Effort: Pulmonary effort is normal.     Breath sounds: Normal breath sounds.  Skin:    General: Skin is warm.  Neurological:     General: No focal deficit present.     Mental Status: She is alert and oriented to person, place, and time.  Psychiatric:        Mood and Affect: Mood normal.        Behavior: Behavior normal.        Thought Content: Thought content normal.        Judgment: Judgment normal.      UC Treatments / Results  Labs (all labs ordered are listed, but only abnormal results are displayed) Labs Reviewed - No data to display  EKG   Radiology No results found.  Procedures Procedures (including critical care time)  Medications Ordered in UC Medications - No data to display  Initial Impression / Assessment and Plan / UC Course  I have reviewed the triage vital signs and the nursing notes.  Pertinent labs & imaging results that were available during my care of the patient were reviewed by me and considered in my medical decision making (see chart for details).     Patient is a pleasant 33 year old female presenting with hyperthyroidism, and concern for thyrotoxicosis.  Per patient, and per care everywhere, she was advised to go to the emergency department by her primary care provider.  We discussed that this is an  urgent care.  She is stable to go to the emergency department and POV driven by a friend.  Final Clinical Impressions(s) / UC Diagnoses   Final diagnoses:  Thyrotoxicosis without thyroid  storm, unspecified thyrotoxicosis type     Discharge Instructions      -Go to the ER     ED Prescriptions   None    PDMP not reviewed this encounter.   Arlyss Leita BRAVO, PA-C 04/22/24 [phone]

## 2024-04-22 NOTE — ED Notes (Signed)
 Extra DG & SST tube drawn

## 2024-04-22 NOTE — ED Triage Notes (Signed)
 Pt states she had blood work at her primary doctor and her thyroid  level was very high and he told her to come it to be evaluated.  Pt states she has some palpitations and vomiting this morning.

## 2024-04-22 NOTE — Discharge Instructions (Addendum)
 Your labs showed evidence of hyperthyroidism.  You did not meet criteria for thyroid  storm.  Recommend taking the medication prescribed by your primary care to help treat the elevated thyroid .  Recommend close follow-up to primary care for further assessment.  If you develop persistent fever, elevated heart rate, confusion or other abnormal mental status or other abnormality to discuss, please promptly return to emergency department as this is concerning for thyroid  storm.  ### Thyroid  Storm Overview     Thyroid  storm is a rare but very serious medical emergency caused by extremely high levels of thyroid  hormones in your body. It can happen in people with untreated or poorly controlled hyperthyroidism (overactive thyroid ). **Thyroid  storm can be life-threatening and needs immediate hospital care**.[1][2][3]      **What are the symptoms?**      - High fever (often above 101F)      - Fast or irregular heartbeat      - Feeling very anxious, confused, or agitated      - Shortness of breath or chest pain      - Nausea, vomiting, or diarrhea      - Weakness or feeling very tired      - Signs of heart failure (swelling in legs, trouble breathing)      - Sometimes, shaking, sweating, or even coma[1][2][3]      **What causes thyroid  storm?**      Thyroid  storm usually happens when something triggers your thyroid  to release too much hormone. Common triggers include:      - Stopping thyroid  medicine suddenly      - Surgery or injury      - Severe infection      - Certain medicines (like amiodarone)      - Radioactive iodine treatment[1][2][3]      **How is thyroid  storm treated?**      Treatment happens in the hospital and may include:      - Medicines to block thyroid  hormone production (such as propylthiouracil or methimazole )      - Beta-blockers to slow your heart rate      - Iodine to help stop hormone release      - Steroids to reduce inflammation and help your body cope       - Supportive care like IV fluids, cooling blankets, and oxygen      - Treating any underlying cause, such as infection[2][4][3]      **What should you do?**      If you have symptoms of thyroid  storm, **call 911 or go to the emergency room right away**. This condition can get worse quickly and needs urgent treatment.      **Can thyroid  storm be prevented?**      Taking your thyroid  medicine as prescribed and seeing your doctor regularly can help prevent thyroid  storm. Let your doctor know if you feel unwell or have symptoms of infection, and never stop your thyroid  medicine without talking to your doctor.[1][2][3]      **Remember:** Thyroid  storm is rare, but it is very serious. Fast treatment saves lives.      ### References  1. Hyperthyroidism: A Review. Jama ALDRICH, Pearce EN. JAMA. 2023;330(15):1472-1483. doi:10.1001/jama.7976.80947. 2. 2016 American Thyroid  Association Guidelines for Diagnosis and Management of Hyperthyroidism and Other Causes of Thyrotoxicosis. Okey DS, Burch HB, Sausha Raymond DS, et al. Thyroid  : Official Journal of the American Thyroid  Association. 2016;26(10):1343-1421. doi:10.1089/thy.2016.0229. 3. High Risk and Low Prevalence Diseases: Thyroid  Storm. Farooqi S, Raj S, Koyfman A, Long  B. The American Journal of Emergency Medicine. 2023;69:127-135. doi:10.1016/j.ajem.2023.03.035. 4. Hyperthyroidism: Aetiology, Pathogenesis, Diagnosis, Management, Complications, and Prognosis. Wiersinga WM, Poppe KG, Effraimidis G. The Lancet. Diabetes & Endocrinology. 2023;11(4):282-298. doi:10.1016/S2213-8587(23)00005-0.

## 2024-04-22 NOTE — ED Triage Notes (Signed)
 Pt sent to the ED from UC due to abnormal thyroid  levels

## 2024-04-22 NOTE — ED Triage Notes (Addendum)
 Pt woke up this AM throwing up with HA, dizziness, CP.  She also received a call from her PCP telling her to come be evaluated r/t elevated thyroid  lab from bloodwork drawn on Wednesday.  All of her symptoms from this AM have resolved at this time.

## 2024-04-22 NOTE — ED Provider Notes (Signed)
 [place] [facility] AT [facility] Provider Note   CSN: [id number] Arrival date & time: 04/22/24  1135     Patient presents with: Abnormal Lab   Kathleen [name] is a 33 y.o. female.    Abnormal Lab   33 year old female presents emergency department with concern for abnormal labs.  Had routine labs drawn by her primary care 2 days ago.  Was found to have low TSH elevated T3/T4 concern for hyperthyroidism.  Patient was called by her primary care this morning and was told to come to the emergency department after discussing her symptoms with concern for possible thyroid  storm.  States that she woke up this morning with feelings of nausea.  States that she felt slightly lightheaded as well as had a mild headache.  States had 1 episode of vomiting when she went to the bathroom.  States that she felt like it could have been related to her blood pressure took her hypertensive medications and noted resolution of symptoms within 30 minutes or so.  Patient has been without symptoms since then.  This occurred around 5 to 6 AM.  Denies any current headache, visual sermons, gait abnormality, slurred speech, facial droop, weakness/sensory deficits in upper extremities.  Denies any fever, chills, abdominal pain, chest pain, shortness of breath.  Does state that she has a grandmother who had hyperthyroidism and was on medication for it.  Past medical history significant for asthma, hypertension, hyperthyroidism  Prior to Admission medications   Medication Sig Start Date End Date Taking? Authorizing Provider  albuterol  (VENTOLIN  HFA) 108 (90 Base) MCG/ACT inhaler Inhale 2 puffs into the lungs every 6 (six) hours as needed for wheezing or shortness of breath. 05/07/22   Ajewole, Christana, MD  amLODipine  (NORVASC ) 5 MG tablet TAKE 1 TABLET (5 MG TOTAL) BY MOUTH DAILY. 09/19/22   Zina Jerilynn LABOR, MD  fluticasone  (FLONASE ) 50 MCG/ACT nasal spray Place 2 sprays into both nostrils daily.  08/22/22 09/21/22  Vannie Cornell SAUNDERS, CNM  montelukast  (SINGULAIR ) 10 MG tablet Take 1 tablet (10 mg total) by mouth at bedtime. Patient taking differently: Take 10 mg by mouth daily. 02/10/22   Zina Jerilynn LABOR, MD  Prenatal Vit-Fe Fumarate-FA (M-NATAL PLUS) 27-1 MG TABS Take 1 tablet by mouth daily. 09/19/22   Zina Jerilynn LABOR, MD    Allergies: Patient has no known allergies.    Review of Systems  All other systems reviewed and are negative.   Updated Vital Signs BP 131/81 (BP Location: Right Arm)   Pulse 94   Temp 97.7 F (36.5 C) (Oral)   Resp 16   LMP 04/15/2024 (Exact Date)   SpO2 100%   Physical Exam Vitals and nursing note reviewed.  Constitutional:      General: She is not in acute distress.    Appearance: She is well-developed.  HENT:     Head: Normocephalic and atraumatic.  Eyes:     Conjunctiva/sclera: Conjunctivae normal.  Cardiovascular:     Rate and Rhythm: Normal rate and regular rhythm.     Heart sounds: No murmur heard. Pulmonary:     Effort: Pulmonary effort is normal. No respiratory distress.     Breath sounds: Normal breath sounds.  Abdominal:     Palpations: Abdomen is soft.     Tenderness: There is no abdominal tenderness.  Musculoskeletal:        General: No swelling.     Cervical back: Neck supple.  Skin:    General: Skin is warm and  dry.     Capillary Refill: Capillary refill takes less than 2 seconds.  Neurological:     Mental Status: She is alert.  Psychiatric:        Mood and Affect: Mood normal.     (all labs ordered are listed, but only abnormal results are displayed) Labs Reviewed  CBC WITH DIFFERENTIAL/PLATELET - Abnormal; Notable for the following components:      Result Value   RBC 5.30 (*)    Hemoglobin 15.4 (*)    All other components within normal limits  HCG, SERUM, QUALITATIVE  COMPREHENSIVE METABOLIC PANEL WITH GFR  TSH  T4, FREE  CK  TROPONIN I (HIGH SENSITIVITY)    EKG: None  Radiology: DG Chest 2  View Result Date: 04/22/2024 EXAM: 2 VIEW(S) XRAY OF THE CHEST 04/22/2024 12:13:00 PM COMPARISON: None available. CLINICAL HISTORY: cp FINDINGS: LUNGS AND PLEURA: No focal pulmonary opacity. No pulmonary edema. No pleural effusion. No pneumothorax. HEART AND MEDIASTINUM: No acute abnormality of the cardiac and mediastinal silhouettes. BONES AND SOFT TISSUES: No acute osseous abnormality. IMPRESSION: 1. No acute process. Electronically signed by: Lynwood Seip MD 04/22/2024 12:37 PM EDT RP Workstation: [id number]     Procedures   Medications Ordered in the ED - No data to display                                  Medical Decision Making Amount and/or Complexity of Data Reviewed Labs: ordered. Radiology: ordered.   This patient presents to the ED for concern of abnormal lab, this involves an extensive number of treatment options, and is a complaint that carries with it a high risk of complications and morbidity.  The differential diagnosis includes hyperthyroidism, thyroid  storm, intra-abdominal abnormality, other   Co morbidities that complicate the patient evaluation  See HPI   Additional history obtained:  Additional history obtained from EMR External records from outside source obtained and reviewed including hospital records   Lab Tests:  I Ordered, and personally interpreted labs.  The pertinent results include: No leukocytosis.  Polycythemia hemoglobin 15.4.  No Electra abnormalities.  No transaminitis.  No renal dysfunction.  Platelets within normal range.  T4 elevated 1.3.  TSH less than 0.1.  Troponin of 3.  CK within normal limits.   Imaging Studies ordered:  I ordered imaging studies including chest x-ray I independently visualized and interpreted imaging which showed no acute cardiopulmonary abnormality I agree with the radiologist interpretation   Cardiac Monitoring: / EKG:  The patient was maintained on a cardiac monitor.  I personally viewed and interpreted  the cardiac monitored which showed an underlying rhythm of: Sinus rhythm   Consultations Obtained:  N/a   Problem List / ED Course / Critical interventions / Medication management  Nausea, vomiting, hyperthyroidism Reevaluation of the patient showed that the patient stayed the same I have reviewed the patients home medicines and have made adjustments as needed   Social Determinants of Health:  Cigarette use.  Denies illicit drug use.   Test / Admission - Considered:  Nausea, vomiting, hyperthyroidism Vitals signs within normal range and stable throughout visit. Laboratory/imaging studies significant for: See above 33 year old female presents emergency department with concern for abnormal labs.  Had routine labs drawn by her primary care 2 days ago.  Was found to have low TSH elevated T3/T4 concern for hyperthyroidism.  Patient was called by her primary care this morning and was told to  come to the emergency department after discussing her symptoms with concern for possible thyroid  storm.  States that she woke up this morning with feelings of nausea.  States that she felt slightly lightheaded as well as had a mild headache.  States had 1 episode of vomiting when she went to the bathroom.  States that she felt like it could have been related to her blood pressure took her hypertensive medications and noted resolution of symptoms within 30 minutes or so.  Patient has been without symptoms since then.  This occurred around 5 to 6 AM.  Denies any current headache, visual sermons, gait abnormality, slurred speech, facial droop, weakness/sensory deficits in upper extremities.  Denies any fever, chills, abdominal pain, chest pain, shortness of breath.  Does state that she has a grandmother who had hyperthyroidism and was on medication for it. On exam, no abdominal tenderness.  Nonfocal neurologic exam.  Labs concerning for hyperthyroidism with elevated T4 with TSH less than 0.1.  Patient does not  meet criteria for thyroid  storm or thyrotoxicosis per Burch-Wartofsky Point Scale with a score of 10-15.  Patient did have 1 episode of isolated nausea and vomiting this morning has been asymptomatic since then.  Given patient's labs reassuring as well as no abdominal tenderness, CT imaging deemed necessary at this time..  Patient already prescribed methimazole  by primary care.  Will recommend beginning this medication.  Will prescribe Zofran  to use as needed.  Strict return/ED precautions discussed.  Will recommend close follow-up with primary care in the outpatient setting for reassessment. Worrisome signs and symptoms were discussed with the patient, and the patient acknowledged understanding to return to the ED if noticed. Patient was stable upon discharge.       Final diagnoses:  None    ED Discharge Orders     None          Silver Wonda LABOR, [place] 04/22/24 1408    Dasie Faden, MD 04/23/24 [phone]

## 2024-04-22 NOTE — ED Notes (Signed)
 Patient is being discharged from the Urgent Care and sent to the Emergency Department via private vehicle . Per L. Arlyss PA, patient is in need of higher level of care due to high thyroid  levels. Patient is aware and verbalizes understanding of plan of care.  Vitals:   04/22/24 1122  BP: 118/82  Pulse: 89  Resp: 16  Temp: 98.4 F (36.9 C)  SpO2: 97%

## 2024-04-22 NOTE — Discharge Instructions (Signed)
 Go to the ER.

## 2024-05-04 DIAGNOSIS — Z419 Encounter for procedure for purposes other than remedying health state, unspecified: Secondary | ICD-10-CM | POA: Diagnosis not present

## 2024-08-30 ENCOUNTER — Ambulatory Visit (HOSPITAL_BASED_OUTPATIENT_CLINIC_OR_DEPARTMENT_OTHER): Admitting: Obstetrics and Gynecology
